# Patient Record
Sex: Female | Born: 1935
Health system: Southern US, Community
[De-identification: ages and names within clinical notes are randomized; demographics above are authoritative.]

## PROBLEM LIST (undated history)

## (undated) DIAGNOSIS — E785 Hyperlipidemia, unspecified: Secondary | ICD-10-CM

## (undated) DIAGNOSIS — F419 Anxiety disorder, unspecified: Secondary | ICD-10-CM

## (undated) DIAGNOSIS — K219 Gastro-esophageal reflux disease without esophagitis: Secondary | ICD-10-CM

## (undated) DIAGNOSIS — I38 Endocarditis, valve unspecified: Secondary | ICD-10-CM

## (undated) DIAGNOSIS — R0602 Shortness of breath: Secondary | ICD-10-CM

## (undated) DIAGNOSIS — J449 Chronic obstructive pulmonary disease, unspecified: Secondary | ICD-10-CM

## (undated) DIAGNOSIS — M1990 Unspecified osteoarthritis, unspecified site: Secondary | ICD-10-CM

## (undated) DIAGNOSIS — I499 Cardiac arrhythmia, unspecified: Secondary | ICD-10-CM

## (undated) DIAGNOSIS — S42492A Other displaced fracture of lower end of left humerus, initial encounter for closed fracture: Secondary | ICD-10-CM

## (undated) DIAGNOSIS — I10 Essential (primary) hypertension: Secondary | ICD-10-CM

## (undated) DIAGNOSIS — R011 Cardiac murmur, unspecified: Secondary | ICD-10-CM

## (undated) DIAGNOSIS — F39 Unspecified mood [affective] disorder: Secondary | ICD-10-CM

## (undated) DIAGNOSIS — C569 Malignant neoplasm of unspecified ovary: Secondary | ICD-10-CM

## (undated) HISTORY — DX: Essential (primary) hypertension: I10

## (undated) HISTORY — DX: Gastro-esophageal reflux disease without esophagitis: K21.9

## (undated) HISTORY — PX: CATARACT EXTRACTION, BILATERAL: SHX1313

## (undated) HISTORY — DX: Malignant neoplasm of unspecified ovary: C56.9

## (undated) HISTORY — DX: Hyperlipidemia, unspecified: E78.5

## (undated) HISTORY — PX: SKIN CANCER EXCISION: SHX779

## (undated) HISTORY — PX: BREAST BIOPSY: SHX20

## (undated) HISTORY — DX: Unspecified mood (affective) disorder: F39

## (undated) HISTORY — DX: Other displaced fracture of lower end of left humerus, initial encounter for closed fracture: S42.492A

## (undated) HISTORY — PX: TONSILLECTOMY: SUR1361

## (undated) HISTORY — PX: SHOULDER SURGERY: SHX246

---

## 2011-07-27 DIAGNOSIS — R5383 Other fatigue: Secondary | ICD-10-CM | POA: Diagnosis not present

## 2011-07-27 DIAGNOSIS — R5381 Other malaise: Secondary | ICD-10-CM | POA: Diagnosis not present

## 2011-07-27 DIAGNOSIS — R634 Abnormal weight loss: Secondary | ICD-10-CM | POA: Diagnosis not present

## 2011-12-20 DIAGNOSIS — R5381 Other malaise: Secondary | ICD-10-CM | POA: Diagnosis not present

## 2011-12-20 DIAGNOSIS — G4700 Insomnia, unspecified: Secondary | ICD-10-CM | POA: Diagnosis not present

## 2011-12-20 DIAGNOSIS — J449 Chronic obstructive pulmonary disease, unspecified: Secondary | ICD-10-CM | POA: Diagnosis not present

## 2011-12-20 DIAGNOSIS — R5383 Other fatigue: Secondary | ICD-10-CM | POA: Diagnosis not present

## 2011-12-20 DIAGNOSIS — R059 Cough, unspecified: Secondary | ICD-10-CM | POA: Diagnosis not present

## 2011-12-20 DIAGNOSIS — R05 Cough: Secondary | ICD-10-CM | POA: Diagnosis not present

## 2012-03-14 DIAGNOSIS — L738 Other specified follicular disorders: Secondary | ICD-10-CM | POA: Diagnosis not present

## 2012-03-14 DIAGNOSIS — Z23 Encounter for immunization: Secondary | ICD-10-CM | POA: Diagnosis not present

## 2012-03-14 DIAGNOSIS — L819 Disorder of pigmentation, unspecified: Secondary | ICD-10-CM | POA: Diagnosis not present

## 2012-03-14 DIAGNOSIS — L821 Other seborrheic keratosis: Secondary | ICD-10-CM | POA: Diagnosis not present

## 2012-03-14 DIAGNOSIS — L608 Other nail disorders: Secondary | ICD-10-CM | POA: Diagnosis not present

## 2012-07-11 DIAGNOSIS — R5383 Other fatigue: Secondary | ICD-10-CM | POA: Diagnosis not present

## 2012-07-11 DIAGNOSIS — E559 Vitamin D deficiency, unspecified: Secondary | ICD-10-CM | POA: Diagnosis not present

## 2012-07-11 DIAGNOSIS — H68009 Unspecified Eustachian salpingitis, unspecified ear: Secondary | ICD-10-CM | POA: Diagnosis not present

## 2012-07-11 DIAGNOSIS — R1012 Left upper quadrant pain: Secondary | ICD-10-CM | POA: Diagnosis not present

## 2012-07-11 DIAGNOSIS — R7309 Other abnormal glucose: Secondary | ICD-10-CM | POA: Diagnosis not present

## 2012-07-11 DIAGNOSIS — J440 Chronic obstructive pulmonary disease with acute lower respiratory infection: Secondary | ICD-10-CM | POA: Diagnosis not present

## 2012-07-11 DIAGNOSIS — J019 Acute sinusitis, unspecified: Secondary | ICD-10-CM | POA: Diagnosis not present

## 2012-07-11 DIAGNOSIS — R5381 Other malaise: Secondary | ICD-10-CM | POA: Diagnosis not present

## 2012-08-01 DIAGNOSIS — IMO0002 Reserved for concepts with insufficient information to code with codable children: Secondary | ICD-10-CM | POA: Diagnosis not present

## 2012-08-01 DIAGNOSIS — J309 Allergic rhinitis, unspecified: Secondary | ICD-10-CM | POA: Diagnosis not present

## 2012-08-01 DIAGNOSIS — J449 Chronic obstructive pulmonary disease, unspecified: Secondary | ICD-10-CM | POA: Diagnosis not present

## 2012-08-01 DIAGNOSIS — E559 Vitamin D deficiency, unspecified: Secondary | ICD-10-CM | POA: Diagnosis not present

## 2012-10-15 DIAGNOSIS — J449 Chronic obstructive pulmonary disease, unspecified: Secondary | ICD-10-CM | POA: Diagnosis not present

## 2012-11-06 DIAGNOSIS — E559 Vitamin D deficiency, unspecified: Secondary | ICD-10-CM | POA: Diagnosis not present

## 2012-11-06 DIAGNOSIS — J449 Chronic obstructive pulmonary disease, unspecified: Secondary | ICD-10-CM | POA: Diagnosis not present

## 2012-11-06 DIAGNOSIS — J309 Allergic rhinitis, unspecified: Secondary | ICD-10-CM | POA: Diagnosis not present

## 2012-11-07 DIAGNOSIS — J449 Chronic obstructive pulmonary disease, unspecified: Secondary | ICD-10-CM | POA: Diagnosis not present

## 2012-11-07 DIAGNOSIS — J309 Allergic rhinitis, unspecified: Secondary | ICD-10-CM | POA: Diagnosis not present

## 2012-11-25 DIAGNOSIS — Z1331 Encounter for screening for depression: Secondary | ICD-10-CM | POA: Diagnosis not present

## 2012-11-25 DIAGNOSIS — J309 Allergic rhinitis, unspecified: Secondary | ICD-10-CM | POA: Diagnosis not present

## 2012-11-25 DIAGNOSIS — E559 Vitamin D deficiency, unspecified: Secondary | ICD-10-CM | POA: Diagnosis not present

## 2012-11-25 DIAGNOSIS — J449 Chronic obstructive pulmonary disease, unspecified: Secondary | ICD-10-CM | POA: Diagnosis not present

## 2012-11-25 DIAGNOSIS — Z9181 History of falling: Secondary | ICD-10-CM | POA: Diagnosis not present

## 2012-11-25 DIAGNOSIS — R002 Palpitations: Secondary | ICD-10-CM | POA: Diagnosis not present

## 2012-11-25 DIAGNOSIS — Z13220 Encounter for screening for lipoid disorders: Secondary | ICD-10-CM | POA: Diagnosis not present

## 2012-11-28 DIAGNOSIS — IMO0002 Reserved for concepts with insufficient information to code with codable children: Secondary | ICD-10-CM | POA: Diagnosis not present

## 2012-11-28 DIAGNOSIS — F411 Generalized anxiety disorder: Secondary | ICD-10-CM | POA: Diagnosis not present

## 2012-11-28 DIAGNOSIS — J449 Chronic obstructive pulmonary disease, unspecified: Secondary | ICD-10-CM | POA: Diagnosis not present

## 2012-11-28 DIAGNOSIS — R110 Nausea: Secondary | ICD-10-CM | POA: Diagnosis not present

## 2012-12-05 DIAGNOSIS — IMO0002 Reserved for concepts with insufficient information to code with codable children: Secondary | ICD-10-CM | POA: Diagnosis not present

## 2012-12-05 DIAGNOSIS — Z0000 Encounter for general adult medical examination without abnormal findings: Secondary | ICD-10-CM | POA: Diagnosis not present

## 2012-12-05 DIAGNOSIS — I4891 Unspecified atrial fibrillation: Secondary | ICD-10-CM | POA: Diagnosis not present

## 2012-12-11 DIAGNOSIS — I10 Essential (primary) hypertension: Secondary | ICD-10-CM | POA: Diagnosis not present

## 2012-12-11 DIAGNOSIS — R079 Chest pain, unspecified: Secondary | ICD-10-CM | POA: Diagnosis not present

## 2012-12-11 DIAGNOSIS — J449 Chronic obstructive pulmonary disease, unspecified: Secondary | ICD-10-CM | POA: Diagnosis not present

## 2012-12-11 DIAGNOSIS — I4891 Unspecified atrial fibrillation: Secondary | ICD-10-CM | POA: Diagnosis not present

## 2012-12-18 DIAGNOSIS — N390 Urinary tract infection, site not specified: Secondary | ICD-10-CM | POA: Diagnosis not present

## 2012-12-18 DIAGNOSIS — I4891 Unspecified atrial fibrillation: Secondary | ICD-10-CM | POA: Diagnosis not present

## 2012-12-18 DIAGNOSIS — F39 Unspecified mood [affective] disorder: Secondary | ICD-10-CM | POA: Diagnosis not present

## 2012-12-23 DIAGNOSIS — M810 Age-related osteoporosis without current pathological fracture: Secondary | ICD-10-CM | POA: Diagnosis not present

## 2012-12-23 DIAGNOSIS — Z13820 Encounter for screening for osteoporosis: Secondary | ICD-10-CM | POA: Diagnosis not present

## 2012-12-23 DIAGNOSIS — Z1231 Encounter for screening mammogram for malignant neoplasm of breast: Secondary | ICD-10-CM | POA: Diagnosis not present

## 2013-01-01 DIAGNOSIS — R928 Other abnormal and inconclusive findings on diagnostic imaging of breast: Secondary | ICD-10-CM | POA: Diagnosis not present

## 2013-01-13 DIAGNOSIS — I4891 Unspecified atrial fibrillation: Secondary | ICD-10-CM | POA: Diagnosis not present

## 2013-01-13 DIAGNOSIS — R079 Chest pain, unspecified: Secondary | ICD-10-CM | POA: Diagnosis not present

## 2013-01-16 DIAGNOSIS — I10 Essential (primary) hypertension: Secondary | ICD-10-CM | POA: Diagnosis not present

## 2013-01-16 DIAGNOSIS — I4891 Unspecified atrial fibrillation: Secondary | ICD-10-CM | POA: Diagnosis not present

## 2013-01-20 DIAGNOSIS — I4891 Unspecified atrial fibrillation: Secondary | ICD-10-CM | POA: Diagnosis not present

## 2013-01-20 DIAGNOSIS — J309 Allergic rhinitis, unspecified: Secondary | ICD-10-CM | POA: Diagnosis not present

## 2013-01-20 DIAGNOSIS — E559 Vitamin D deficiency, unspecified: Secondary | ICD-10-CM | POA: Diagnosis not present

## 2013-01-20 DIAGNOSIS — J449 Chronic obstructive pulmonary disease, unspecified: Secondary | ICD-10-CM | POA: Diagnosis not present

## 2013-01-30 DIAGNOSIS — R079 Chest pain, unspecified: Secondary | ICD-10-CM | POA: Diagnosis not present

## 2013-02-05 ENCOUNTER — Encounter (HOSPITAL_COMMUNITY): Payer: Self-pay

## 2013-02-05 ENCOUNTER — Emergency Department (HOSPITAL_COMMUNITY): Payer: Medicare Other

## 2013-02-05 ENCOUNTER — Inpatient Hospital Stay (HOSPITAL_COMMUNITY)
Admission: EM | Admit: 2013-02-05 | Discharge: 2013-02-08 | DRG: 192 | Disposition: A | Discharge status: Home / Self Care | Payer: Medicare Other | Attending: Internal Medicine | Admitting: Internal Medicine

## 2013-02-05 DIAGNOSIS — I38 Endocarditis, valve unspecified: Secondary | ICD-10-CM | POA: Diagnosis present

## 2013-02-05 DIAGNOSIS — J479 Bronchiectasis, uncomplicated: Secondary | ICD-10-CM | POA: Diagnosis not present

## 2013-02-05 DIAGNOSIS — J449 Chronic obstructive pulmonary disease, unspecified: Secondary | ICD-10-CM | POA: Diagnosis present

## 2013-02-05 DIAGNOSIS — R0789 Other chest pain: Secondary | ICD-10-CM | POA: Diagnosis not present

## 2013-02-05 DIAGNOSIS — R0902 Hypoxemia: Secondary | ICD-10-CM

## 2013-02-05 DIAGNOSIS — R0602 Shortness of breath: Secondary | ICD-10-CM | POA: Diagnosis not present

## 2013-02-05 DIAGNOSIS — R079 Chest pain, unspecified: Secondary | ICD-10-CM

## 2013-02-05 DIAGNOSIS — Z79899 Other long term (current) drug therapy: Secondary | ICD-10-CM

## 2013-02-05 DIAGNOSIS — R05 Cough: Secondary | ICD-10-CM | POA: Diagnosis not present

## 2013-02-05 DIAGNOSIS — Z87891 Personal history of nicotine dependence: Secondary | ICD-10-CM | POA: Diagnosis not present

## 2013-02-05 DIAGNOSIS — Z66 Do not resuscitate: Secondary | ICD-10-CM | POA: Diagnosis not present

## 2013-02-05 DIAGNOSIS — I4891 Unspecified atrial fibrillation: Secondary | ICD-10-CM | POA: Diagnosis not present

## 2013-02-05 DIAGNOSIS — J441 Chronic obstructive pulmonary disease with (acute) exacerbation: Secondary | ICD-10-CM | POA: Diagnosis not present

## 2013-02-05 DIAGNOSIS — I5189 Other ill-defined heart diseases: Secondary | ICD-10-CM | POA: Diagnosis present

## 2013-02-05 DIAGNOSIS — R059 Cough, unspecified: Secondary | ICD-10-CM | POA: Diagnosis not present

## 2013-02-05 HISTORY — DX: Chest pain, unspecified: R07.9

## 2013-02-05 HISTORY — DX: Anxiety disorder, unspecified: F41.9

## 2013-02-05 HISTORY — DX: Shortness of breath: R06.02

## 2013-02-05 HISTORY — DX: Hypoxemia: R09.02

## 2013-02-05 HISTORY — DX: Endocarditis, valve unspecified: I38

## 2013-02-05 HISTORY — DX: Unspecified osteoarthritis, unspecified site: M19.90

## 2013-02-05 HISTORY — DX: Cardiac arrhythmia, unspecified: I49.9

## 2013-02-05 HISTORY — DX: Cardiac murmur, unspecified: R01.1

## 2013-02-05 HISTORY — DX: Chronic obstructive pulmonary disease, unspecified: J44.9

## 2013-02-05 LAB — CBC
HCT: 44.1 % (ref 36.0–46.0)
MCH: 33.3 pg (ref 26.0–34.0)
MCHC: 35.1 g/dL (ref 30.0–36.0)
MCV: 94.8 fL (ref 78.0–100.0)
Platelets: 238 10*3/uL (ref 150–400)
RDW: 14.6 % (ref 11.5–15.5)

## 2013-02-05 LAB — TROPONIN I: Troponin I: <0.3 ng/mL

## 2013-02-05 LAB — POCT I-STAT, CHEM 8
BUN: 20 mg/dL (ref 6–23)
Calcium, Ion: 1.17 mmol/L (ref 1.13–1.30)
Chloride: 105 mEq/L (ref 96–112)
Creatinine, Ser: 1 mg/dL (ref 0.50–1.10)
TCO2: 23 mmol/L (ref 0–100)

## 2013-02-05 LAB — URINALYSIS, ROUTINE W REFLEX MICROSCOPIC
Hgb urine dipstick: NEGATIVE
Ketones, ur: 15 mg/dL — AB
Nitrite: NEGATIVE
Urobilinogen, UA: 0.2 mg/dL (ref 0.0–1.0)

## 2013-02-05 LAB — POCT I-STAT TROPONIN I: Troponin i, poc: 0.03 ng/mL (ref 0.00–0.08)

## 2013-02-05 LAB — D-DIMER, QUANTITATIVE: D-Dimer, Quant: 0.64 ug/mL-FEU — ABNORMAL HIGH (ref 0.00–0.48)

## 2013-02-05 LAB — URINE MICROSCOPIC-ADD ON

## 2013-02-05 MED ORDER — SODIUM CHLORIDE 0.9 % IJ SOLN: 3.0000 mL | Freq: Two times a day (BID) | INTRAMUSCULAR | Status: DC

## 2013-02-05 MED ORDER — LEVALBUTEROL HCL 1.25 MG/0.5ML IN NEBU
1.2500 mg | INHALATION_SOLUTION | Freq: Once | RESPIRATORY_TRACT | Status: AC
  Administered 2013-02-05: 1.25 mg via RESPIRATORY_TRACT
  Filled 2013-02-05: qty 0.5

## 2013-02-05 MED ORDER — HYDROCODONE-ACETAMINOPHEN 5-325 MG PO TABS: 1.0000 | ORAL_TABLET | ORAL | Status: DC | PRN

## 2013-02-05 MED ORDER — LEVALBUTEROL HCL 1.25 MG/3ML IN NEBU: 1.2500 mg | INHALATION_SOLUTION | Freq: Once | RESPIRATORY_TRACT | Status: DC

## 2013-02-05 MED ORDER — DILTIAZEM HCL 60 MG PO TABS
120.0000 mg | ORAL_TABLET | Freq: Every day | ORAL | Status: DC
  Administered 2013-02-05 – 2013-02-08 (×4): 120 mg via ORAL
  Filled 2013-02-05 (×4): qty 2

## 2013-02-05 MED ORDER — ONDANSETRON HCL 4 MG/2ML IJ SOLN
4.0000 mg | Freq: Four times a day (QID) | INTRAMUSCULAR | Status: DC | PRN
  Filled 2013-02-05: qty 2

## 2013-02-05 MED ORDER — LEVALBUTEROL HCL 0.63 MG/3ML IN NEBU
0.6300 mg | INHALATION_SOLUTION | Freq: Once | RESPIRATORY_TRACT | Status: AC
  Administered 2013-02-05: 0.63 mg via RESPIRATORY_TRACT
  Filled 2013-02-05: qty 3

## 2013-02-05 MED ORDER — RIVAROXABAN 20 MG PO TABS
20.0000 mg | ORAL_TABLET | Freq: Every day | ORAL | Status: DC
  Administered 2013-02-05 – 2013-02-08 (×4): 20 mg via ORAL
  Filled 2013-02-05 (×4): qty 1

## 2013-02-05 MED ORDER — IOHEXOL 350 MG/ML SOLN
100.0000 mL | Freq: Once | INTRAVENOUS | Status: AC | PRN
  Administered 2013-02-05: 80 mL via INTRAVENOUS

## 2013-02-05 MED ORDER — IPRATROPIUM BROMIDE 0.02 % IN SOLN
0.5000 mg | Freq: Once | RESPIRATORY_TRACT | Status: AC
  Administered 2013-02-05: 0.5 mg via RESPIRATORY_TRACT
  Filled 2013-02-05: qty 2.5

## 2013-02-05 MED ORDER — ONDANSETRON HCL 4 MG PO TABS
4.0000 mg | ORAL_TABLET | Freq: Four times a day (QID) | ORAL | Status: DC | PRN
  Filled 2013-02-05: qty 1

## 2013-02-05 MED ORDER — IPRATROPIUM BROMIDE 0.02 % IN SOLN
500.0000 ug | Freq: Every day | RESPIRATORY_TRACT | Status: DC | PRN
  Filled 2013-02-05 (×3): qty 2.5

## 2013-02-05 MED ORDER — LEVALBUTEROL HCL 0.63 MG/3ML IN NEBU
0.6300 mg | INHALATION_SOLUTION | Freq: Four times a day (QID) | RESPIRATORY_TRACT | Status: DC
  Administered 2013-02-05 – 2013-02-06 (×2): 0.63 mg via RESPIRATORY_TRACT
  Filled 2013-02-05 (×6): qty 3

## 2013-02-05 MED ORDER — LEVOFLOXACIN IN D5W 750 MG/150ML IV SOLN
750.0000 mg | INTRAVENOUS | Status: DC
  Administered 2013-02-05 – 2013-02-06 (×2): 750 mg via INTRAVENOUS
  Filled 2013-02-05 (×3): qty 150

## 2013-02-05 MED ORDER — GUAIFENESIN ER 600 MG PO TB12
1200.0000 mg | ORAL_TABLET | Freq: Two times a day (BID) | ORAL | Status: DC
  Administered 2013-02-05 – 2013-02-08 (×6): 1200 mg via ORAL
  Filled 2013-02-05 (×7): qty 2

## 2013-02-05 MED ORDER — ACETAMINOPHEN 325 MG PO TABS
650.0000 mg | ORAL_TABLET | Freq: Four times a day (QID) | ORAL | Status: DC | PRN
  Filled 2013-02-05: qty 2

## 2013-02-05 MED ORDER — METHYLPREDNISOLONE SODIUM SUCC 125 MG IJ SOLR
125.0000 mg | Freq: Once | INTRAMUSCULAR | Status: DC
  Filled 2013-02-05: qty 2

## 2013-02-05 MED ORDER — METHYLPREDNISOLONE SODIUM SUCC 125 MG IJ SOLR
80.0000 mg | Freq: Three times a day (TID) | INTRAMUSCULAR | Status: DC
  Administered 2013-02-05 – 2013-02-06 (×3): 80 mg via INTRAVENOUS
  Filled 2013-02-05 (×5): qty 1.28

## 2013-02-05 MED ORDER — MORPHINE SULFATE 2 MG/ML IJ SOLN: 1.0000 mg | INTRAMUSCULAR | Status: DC | PRN

## 2013-02-05 MED ORDER — IPRATROPIUM BROMIDE 0.02 % IN SOLN
0.5000 mg | Freq: Four times a day (QID) | RESPIRATORY_TRACT | Status: DC
  Administered 2013-02-05 – 2013-02-06 (×2): 0.5 mg via RESPIRATORY_TRACT

## 2013-02-05 MED ORDER — ACETAMINOPHEN 650 MG RE SUPP: 650.0000 mg | Freq: Four times a day (QID) | RECTAL | Status: DC | PRN

## 2013-02-05 MED ORDER — SODIUM CHLORIDE 0.9 % IV SOLN
INTRAVENOUS | Status: DC
  Administered 2013-02-05 – 2013-02-06 (×2): via INTRAVENOUS

## 2013-02-05 MED ORDER — GUAIFENESIN-DM 100-10 MG/5ML PO SYRP: 5.0000 mL | ORAL_SOLUTION | ORAL | Status: DC | PRN

## 2013-02-05 NOTE — ED Provider Notes (Signed)
CSN: [id number]     Arrival date & time 02/05/13  1038 History   First MD Initiated Contact with Patient 02/05/13 1139     Chief Complaint  Patient presents with  . Chest Pain  . Shortness of Breath   (Consider location/radiation/quality/duration/timing/severity/associated sxs/prior Treatment) HPI Pt presents with c/o chest pain and shortness of breath.  Pt states that last night around 1am she had left sided chest pain with radiation into her back.  She states the pain lasted approx 1 hour and resolved on its own.  She also c/o shortness of breath and tightness in chest.  She states she has had issues with wheezing in the past- but denies hx of asthma/copd.  Stopped smoking in February 2014.  No fever/chills.  Denies leg swelling.  States her chest has felt congested over the past several weeks.  She also states that approx 2 months ago she was diagnosed with atrial fibrillation and started on xarelto.  She sees PMD and cardiologist in [place].  There are no other associated systemic symptoms, there are no other alleviating or modifying factors.   Past Medical History  Diagnosis Date  . Irregular heart beat   . Leaky heart valve    Past Surgical History  Procedure Laterality Date  . Tonsillectomy     Family History  Problem Relation Age of Onset  . Diabetes Mother   . Asthma Father    History  Substance Use Topics  . Smoking status: Former Smoker    Quit date: 06/07/2012  . Smokeless tobacco: Not on file  . Alcohol Use: No   OB History   Grav Para Term Preterm Abortions TAB SAB Ect Mult Living                 Review of Systems ROS reviewed and all otherwise negative except for mentioned in HPI  Allergies  Albuterol  Home Medications   No current outpatient prescriptions on file. BP 150/74  Pulse 80  Temp(Src) 97.8 F (36.6 C) (Oral)  Resp 24  SpO2 90% Vitals reviewed Physical Exam Physical Examination: General appearance - alert, well appearing, and in no  distress Mental status - alert, oriented to person, place, and time Eyes - no conjunctival injection, no scleral icterus Mouth - mucous membranes moist, pharynx normal without lesions Chest - increased respiratory effort, bilateral expiratory wheezing, no rhonchi, symmetric air entry Heart - normal rate, regular rhythm, normal S1, S2, no murmurs, rubs, clicks or gallops Abdomen - soft, nontender, nondistended, no masses or organomegaly Extremities - peripheral pulses normal, no pedal edema, no clubbing or cyanosis Skin - normal coloration and turgor, no rashes  ED Course  Procedures (including critical care time)  12:05 PM have gone to see patient twice, she is in xray, not in room Labs Review   Date: 02/05/2013  Rate: 85  Rhythm: normal sinus rhythm  QRS Axis: normal  Intervals: normal  ST/T Wave abnormalities: nonspecific ST/T changes  Conduction Disutrbances:none  Narrative Interpretation: poor r wave progression  Old EKG Reviewed: none available   4:35 PM d/w [facility] for admission- requested team 10 telemetry bed, holding orders written Labs Reviewed  CBC - Abnormal; Notable for the following:    Hemoglobin 15.5 (*)    All other components within normal limits  URINALYSIS, ROUTINE W REFLEX MICROSCOPIC - Abnormal; Notable for the following:    Bilirubin Urine SMALL (*)    Ketones, ur 15 (*)    Leukocytes, UA TRACE (*)  All other components within normal limits  D-DIMER, QUANTITATIVE - Abnormal; Notable for the following:    D-Dimer, Quant 0.64 (*)    All other components within normal limits  POCT I-STAT, CHEM 8 - Abnormal; Notable for the following:    Hemoglobin 15.6 (*)    All other components within normal limits  PRO B NATRIURETIC PEPTIDE  URINE MICROSCOPIC-ADD ON  TSH  BASIC METABOLIC PANEL  CBC  TROPONIN I  TROPONIN I  TROPONIN I  POCT I-STAT TROPONIN I   Imaging Review Dg Chest 2 View  02/05/2013   CLINICAL DATA:  79 year old female with chest pain  shortness of breath cough and congestion.  EXAM: CHEST  2 VIEW  COMPARISON:  None.  FINDINGS: Lung volumes at the upper limits of normal. Normal cardiac size and mediastinal contours. Visualized tracheal air column is within normal limits. No pneumothorax, pulmonary edema, pleural effusion or confluent pulmonary opacity. Calcified atherosclerosis of the aorta. Osteopenia and mild scoliosis. Postoperative changes to the proximal right humerus.  IMPRESSION: No active cardiopulmonary disease.   Electronically Signed   By: Augusto Gamble   On: 02/05/2013 12:09   Ct Angio Chest Pe W/cm &/or Wo Cm  02/05/2013   CLINICAL DATA:  79 year old female chest pain and shortness of Breath.  EXAM: CT ANGIOGRAPHY CHEST WITH CONTRAST  TECHNIQUE: Multidetector CT imaging of the chest was performed using the standard protocol during bolus administration of intravenous contrast. Multiplanar CT image reconstructions including MIPs were obtained to evaluate the vascular anatomy.  CONTRAST:  80mL OMNIPAQUE IOHEXOL 350 MG/ML SOLN  COMPARISON:  Chest radiographs 02/05/2013.  FINDINGS: Good contrast bolus timing in the pulmonary arterial tree. Mild respiratory motion artifact. No focal filling defect identified in the pulmonary arterial tree to suggest the presence of acute pulmonary embolism.  Major airways are patent. Mild bilateral central peribronchial thickening. Mild right upper lobe bronchiectasis. No consolidation. No pleural effusion.  No pericardial effusion. Mild to moderate hiatal hernia. Diffuse atherosclerosis of the visualized aorta. Great vessel origins are patent. Coronary atherosclerosis. Abundant soft plaque or mural thrombus in the proximal descending thoracic aorta. Celiac origin is patent. No mediastinal lymphadenopathy.  Negative visualized upper abdominal viscera.  Mild scoliosis. Mild for age degenerative changes in the spine. No acute osseous abnormality identified.  Review of the MIP images confirms the above findings.   IMPRESSION: 1. No evidence of acute pulmonary embolus.  2. No acute findings identified. Mild peribronchial thickening. Occasional bronchiectasis. Extensive aortic atherosclerosis. Small hiatal hernia.   Electronically Signed   By: Augusto Gamble   On: 02/05/2013 14:48    MDM   1. COPD with acute exacerbation   2. Atrial fibrillation   3. Chest pain   4. Hypoxia    Pt presenting with c/o shortness of breath and chest pain.  Chest pain has resolved, troponin is negative, EKG without any acute changes- however given age must consider ACS.  She is currently taking xarelto for afib- relatively newly diagnosed, EKG shows sinus rhythm today.  Pt has significant wheezing on exam, some improvement after xopenex and atrovent (pt states she cannot tolerate albuterol).  She was also started on levaquin and solumedrol.  Pt admitted to [facility] for further management.      Ethelda Chick, MD 02/05/13 [phone]

## 2013-02-05 NOTE — ED Notes (Signed)
Patient transported to X-ray 

## 2013-02-05 NOTE — ED Notes (Signed)
MD at bedside. 

## 2013-02-05 NOTE — H&P (Signed)
[facility] History and Physical  Adriana Castro WUX:[id number] DOB: 1929/04/17 DOA: 02/05/2013  Referring physician: Ethelda Chick, MD PCP: Lucianne Lei, MD  Specialists:   Chief Complaint: Shortness of breath and chest pain  HPI: Adriana Castro is a 79 y.o. female was past medical history of atrial fibrillation, COPD and leaky heart filed. Patient came in to the hospital because of shortness of breath and chest pain. Patient lives [place], [place], she stated she was in her usual state of health till yesterday when she started to have some shortness of breath, wheezing, cough with minimal sputum production. Last night she developed stabbing chest pain that goes from her epigastric/substernal area to the back. The pain was 6/10 in intensity, no radiation lasted for about an hour. Patient came into the ED for further evaluation. In the ED initial evaluation showed positive d-dimer is, CT angio was done and showed no evidence of PE or dissection. She has negative cardiac enzymes, negative EKG. She was hypoxic on room air, patient admitted to the hospital for COPD exacerbation.  Review of Systems: Constitutional: negative for anorexia, fevers and sweats Eyes: negative for irritation, redness and visual disturbance Ears, nose, mouth, throat, and face: negative for earaches, epistaxis, nasal congestion and sore throat Respiratory: Has cough, dyspnea on exertion, sputum and wheezing Cardiovascular: Has chest pain and dyspnea, but denies lower extremity edema, orthopnea, palpitations and syncope Gastrointestinal: negative for abdominal pain, constipation, diarrhea, melena, nausea and vomiting Genitourinary:negative for dysuria, frequency and hematuria Hematologic/lymphatic: negative for bleeding, easy bruising and lymphadenopathy Musculoskeletal:negative for arthralgias, muscle weakness and stiff joints Neurological: negative for coordination problems, gait problems, headaches and weakness Endocrine:  negative for diabetic symptoms including polydipsia, polyuria and weight loss Allergic/Immunologic: negative for anaphylaxis, hay fever and urticaria  Past Medical History  Diagnosis Date  . Irregular heart beat   . Leaky heart valve    Past Surgical History  Procedure Laterality Date  . Tonsillectomy     Social History:  reports that she quit smoking about 7 months ago. She does not have any smokeless tobacco history on file. She reports that she does not drink alcohol or use illicit drugs.  Allergies  Allergen Reactions  . Albuterol Nausea And Vomiting    Family History  Problem Relation Age of Onset  . Diabetes Mother   . Asthma Father    Prior to Admission medications   Medication Sig Start Date End Date Taking? Authorizing Provider  diltiazem (CARDIZEM) 120 MG tablet Take 120 mg by mouth daily.   Yes Historical Provider, MD  ipratropium (ATROVENT) 0.02 % nebulizer solution Take 500 mcg by nebulization daily as needed for wheezing.   Yes Historical Provider, MD  levalbuterol [facility] HFA) 45 MCG/ACT inhaler Inhale 1-2 puffs into the lungs every 4 (four) hours as needed for wheezing or shortness of breath.   Yes Historical Provider, MD  Rivaroxaban (XARELTO) 20 MG TABS tablet Take 20 mg by mouth daily.   Yes Historical Provider, MD  tiotropium (SPIRIVA) 18 MCG inhalation capsule Place 18 mcg into inhaler and inhale daily.   Yes Historical Provider, MD   Physical Exam: Filed Vitals:   02/05/13 1700  BP: 150/74  Pulse: 80  Temp:   Resp:    General appearance: alert, cooperative and no distress, oxygen per nasal cannula Head: Normocephalic, without obvious abnormality, atraumatic  Eyes: conjunctivae/corneas clear. PERRL, EOM's intact. Fundi benign.  Nose: Nares normal. Septum midline. Mucosa normal. No drainage or sinus tenderness.  Throat: lips, mucosa, and tongue  normal; teeth and gums normal  Neck: Supple, no masses, no cervical lymphadenopathy, no JVD appreciated, no  meningeal signs Resp: Bilateral expiratory wheezes Chest wall: no tenderness  Cardio: regular rate and rhythm, S1, S2 normal, no murmur, click, rub or gallop  GI: soft, non-tender; bowel sounds normal; no masses, no organomegaly  Extremities: extremities normal, atraumatic, no cyanosis or edema  Skin: Skin color, texture, turgor normal. No rashes or lesions  Neurologic: Alert and oriented X 3, normal strength and tone. Normal symmetric reflexes. Normal coordination and gait   Labs on Admission:  Basic Metabolic Panel:  Recent Labs Lab 02/05/13 1322  NA 139  K 4.5  CL 105  GLUCOSE 82  BUN 20  CREATININE 1.00   Liver Function Tests: No results found for this basename: AST, ALT, ALKPHOS, BILITOT, PROT, ALBUMIN,  in the last 168 hours No results found for this basename: LIPASE, AMYLASE,  in the last 168 hours No results found for this basename: AMMONIA,  in the last 168 hours CBC:  Recent Labs Lab 02/05/13 1130 02/05/13 1322  WBC 7.9  --   HGB 15.5* 15.6*  HCT 44.1 46.0  MCV 94.8  --   PLT 238  --    Cardiac Enzymes: No results found for this basename: CKTOTAL, CKMB, CKMBINDEX, TROPONINI,  in the last 168 hours  BNP (last 3 results)  Recent Labs  02/05/13 1130  PROBNP 355.2   CBG: No results found for this basename: GLUCAP,  in the last 168 hours  Radiological Exams on Admission: Dg Chest 2 View  02/05/2013   CLINICAL DATA:  79 year old female with chest pain shortness of breath cough and congestion.  EXAM: CHEST  2 VIEW  COMPARISON:  None.  FINDINGS: Lung volumes at the upper limits of normal. Normal cardiac size and mediastinal contours. Visualized tracheal air column is within normal limits. No pneumothorax, pulmonary edema, pleural effusion or confluent pulmonary opacity. Calcified atherosclerosis of the aorta. Osteopenia and mild scoliosis. Postoperative changes to the proximal right humerus.  IMPRESSION: No active cardiopulmonary disease.   Electronically Signed    By: Augusto Gamble   On: 02/05/2013 12:09   Ct Angio Chest Pe W/cm &/or Wo Cm  02/05/2013   CLINICAL DATA:  79 year old female chest pain and shortness of Breath.  EXAM: CT ANGIOGRAPHY CHEST WITH CONTRAST  TECHNIQUE: Multidetector CT imaging of the chest was performed using the standard protocol during bolus administration of intravenous contrast. Multiplanar CT image reconstructions including MIPs were obtained to evaluate the vascular anatomy.  CONTRAST:  80mL OMNIPAQUE IOHEXOL 350 MG/ML SOLN  COMPARISON:  Chest radiographs 02/05/2013.  FINDINGS: Good contrast bolus timing in the pulmonary arterial tree. Mild respiratory motion artifact. No focal filling defect identified in the pulmonary arterial tree to suggest the presence of acute pulmonary embolism.  Major airways are patent. Mild bilateral central peribronchial thickening. Mild right upper lobe bronchiectasis. No consolidation. No pleural effusion.  No pericardial effusion. Mild to moderate hiatal hernia. Diffuse atherosclerosis of the visualized aorta. Great vessel origins are patent. Coronary atherosclerosis. Abundant soft plaque or mural thrombus in the proximal descending thoracic aorta. Celiac origin is patent. No mediastinal lymphadenopathy.  Negative visualized upper abdominal viscera.  Mild scoliosis. Mild for age degenerative changes in the spine. No acute osseous abnormality identified.  Review of the MIP images confirms the above findings.  IMPRESSION: 1. No evidence of acute pulmonary embolus.  2. No acute findings identified. Mild peribronchial thickening. Occasional bronchiectasis. Extensive aortic atherosclerosis. Small hiatal  hernia.   Electronically Signed   By: Augusto Gamble   On: 02/05/2013 14:48    EKG: Independently reviewed.   Assessment/Plan Principal Problem:   COPD with acute exacerbation Active Problems:   Hypoxia   Leaky heart valve   Chest pain   Atrial fibrillation   COPD with acute exacerbation -Patient will be  admitted to the hospital for further evaluation. -Started on IV Levaquin and IV steroids. -Supportive management with bronchodilators, mucolytics and stenosis. -CT scan showed no acute abnormalities.  Hypoxia -Secondary to COPD, patient is on 2 L of oxygen.  Atrial fibrillation -Rate is controlled with Cardizem -Patient is anticoagulated with Xarelto.  Chest pain -Atypical chest pain, substernal but radiates to the back, and resolve on its own. -As mentioned above CT angio of the chest was negative for acute abnormalities. -First set of cardiac enzymes were negative, cycle 3 sets of cardiac enzymes, repeat EKG in a.m.  Code Status: Full code Family Communication: Plan discussed with the patient with the presence of her sister and brother-in-law at bedside. Disposition Plan: Inpatient, telemetry, anticipate length of stay to be greater than 2 midnights  Time spent: 70 minutes  [facility] Pager [phone]  If 7PM-7AM, please contact night-coverage [email or web address] Password [facility] 02/05/2013, 5:45 PM

## 2013-02-06 ENCOUNTER — Encounter (HOSPITAL_COMMUNITY): Payer: Self-pay | Admitting: General Practice

## 2013-02-06 DIAGNOSIS — R079 Chest pain, unspecified: Secondary | ICD-10-CM | POA: Diagnosis not present

## 2013-02-06 DIAGNOSIS — J441 Chronic obstructive pulmonary disease with (acute) exacerbation: Secondary | ICD-10-CM | POA: Diagnosis not present

## 2013-02-06 DIAGNOSIS — R0902 Hypoxemia: Secondary | ICD-10-CM | POA: Diagnosis not present

## 2013-02-06 DIAGNOSIS — I4891 Unspecified atrial fibrillation: Secondary | ICD-10-CM | POA: Diagnosis not present

## 2013-02-06 LAB — TSH: TSH: 1.012 u[IU]/mL (ref 0.350–4.500)

## 2013-02-06 LAB — BASIC METABOLIC PANEL
Calcium: 9.5 mg/dL (ref 8.4–10.5)
GFR calc Af Amer: 89 mL/min — ABNORMAL LOW (ref >90)
GFR calc non Af Amer: 76 mL/min — ABNORMAL LOW (ref >90)
Potassium: 4.3 mEq/L (ref 3.5–5.1)
Sodium: 137 mEq/L (ref 135–145)

## 2013-02-06 LAB — CBC
Hemoglobin: 13.4 g/dL (ref 12.0–15.0)
MCH: 32.1 pg (ref 26.0–34.0)
Platelets: 237 10*3/uL (ref 150–400)
RBC: 4.18 MIL/uL (ref 3.87–5.11)
WBC: 7.4 10*3/uL (ref 4.0–10.5)

## 2013-02-06 LAB — HEMOGLOBIN A1C
Hgb A1c MFr Bld: 5.4 % (ref <5.7)
Mean Plasma Glucose: 108 mg/dL (ref <117)

## 2013-02-06 LAB — TROPONIN I: Troponin I: <0.3 ng/mL (ref <0.30)

## 2013-02-06 MED ORDER — PREDNISONE 50 MG PO TABS
60.0000 mg | ORAL_TABLET | Freq: Every day | ORAL | Status: DC
  Administered 2013-02-07 – 2013-02-08 (×2): 60 mg via ORAL
  Filled 2013-02-06 (×4): qty 1

## 2013-02-06 MED ORDER — IPRATROPIUM BROMIDE 0.02 % IN SOLN
0.5000 mg | Freq: Two times a day (BID) | RESPIRATORY_TRACT | Status: DC
  Administered 2013-02-06 – 2013-02-08 (×4): 0.5 mg via RESPIRATORY_TRACT
  Filled 2013-02-06 (×3): qty 2.5

## 2013-02-06 MED ORDER — LEVALBUTEROL HCL 0.63 MG/3ML IN NEBU
0.6300 mg | INHALATION_SOLUTION | Freq: Two times a day (BID) | RESPIRATORY_TRACT | Status: DC
  Administered 2013-02-06 – 2013-02-08 (×4): 0.63 mg via RESPIRATORY_TRACT
  Filled 2013-02-06 (×8): qty 3

## 2013-02-06 NOTE — Progress Notes (Signed)
Discussed with the patient in the presence of her 2 sisters. Patient wants to be DO NOT RESUSCITATE/ DO NOT INTUBATE.  Clint Lipps Pager: [phone] 02/06/2013, 4:14 PM

## 2013-02-06 NOTE — Progress Notes (Signed)
[facility] clear mild diminished this morning.  No resp distress noted.  Pt states her breathing feels back to normal.  Pt denies SOB and denies DOE.  Changed xopenex/atrovent nebs to bid-pt states this is her home reg.

## 2013-02-06 NOTE — Progress Notes (Signed)
Utilization review completed.  

## 2013-02-06 NOTE — Evaluation (Signed)
Physical Therapy Evaluation Patient Details Name: Adriana Castro MRN: [id number] DOB: 10/25/1928 Today's Date: 02/06/2013 Time: 4098-1191 PT Time Calculation (min): 26 min  PT Assessment / Plan / Recommendation History of Present Illness  Adriana Castro is a 79 y.o. female was past medical history of atrial fibrillation, COPD and leaky heart filed. Patient came in to the hospital because of shortness of breath and chest pain. Patient lives [place], [place], she stated she was in her usual state of health till yesterday when she started to have some shortness of breath, wheezing, cough with minimal sputum production. Last night she developed stabbing chest pain that goes from her epigastric/substernal area to the back. The pain was 6/10 in intensity, no radiation lasted for about an hour. Patient came into the ED for further evaluation.  Clinical Impression  Patient at baseline for mobility at this time. Patient ambulated without assistive device.  Patient did demonstrate decreased activity tolerance with O2 saturation dropping to 86 on rm air with increased ambulation. Educated patient on breathing techniques and energy conservation. Patient has supportive family nearby. Feel patient is safe for dc home without PT follow up. Patient agrees, PT will sign off.    PT Assessment  Patent does not need any further PT services    Follow Up Recommendations  No PT follow up          Equipment Recommendations  None recommended by PT          Precautions / Restrictions     Pertinent Vitals/Pain No pain, Spo@ on2 liters 99%, on rm air 97%, ambulation on rm air dropped to 86%      Mobility  Bed Mobility Bed Mobility: Supine to Sit;Sitting - Scoot to Edge of Bed Supine to Sit: 6: Modified independent (Device/Increase time) Sitting - Scoot to Edge of Bed: 6: Modified independent (Device/Increase time) Details for Bed Mobility Assistance: increased time to perform Transfers Transfers: Sit to  Stand;Stand to Sit Sit to Stand: 6: Modified independent (Device/Increase time);Without upper extremity assist;From bed;From chair/3-in-1;From toilet Stand to Sit: 6: Modified independent (Device/Increase time);To bed;To chair/3-in-1;To toilet Details for Transfer Assistance: No physical assist needed Ambulation/Gait Ambulation/Gait Assistance: 7: Independent Ambulation Distance (Feet): 410 Feet Assistive device: None Ambulation/Gait Assistance Details: steady gait Gait Pattern: Trunk flexed;Narrow base of support Gait velocity: decreased modestly General Gait Details: overall steady with ambulation, some generalized fatigue with increased activity     Acute Rehab PT Goals Patient Stated Goal: to go home today PT Goal Formulation: With patient Time For Goal Achievement: 02/20/13 Potential to Achieve Goals: Good  Visit Information  Last PT Received On: 02/06/13 Assistance Needed: +1 History of Present Illness: Adriana Castro is a 79 y.o. female was past medical history of atrial fibrillation, COPD and leaky heart filed. Patient came in to the hospital because of shortness of breath and chest pain. Patient lives [place], [place], she stated she was in her usual state of health till yesterday when she started to have some shortness of breath, wheezing, cough with minimal sputum production. Last night she developed stabbing chest pain that goes from her epigastric/substernal area to the back. The pain was 6/10 in intensity, no radiation lasted for about an hour. Patient came into the ED for further evaluation.       Prior Functioning  Home Living Family/patient expects to be discharged to:: Private residence Living Arrangements: Alone Available Help at Discharge: Family Type of Home: House Home Access: Stairs to enter [employer] of Steps: 1 Entrance Stairs-Rails:  Right;Left Home Layout: One level Home Equipment: Cane - single point Prior Function Level of Independence:  Independent Communication Communication: No difficulties Dominant Hand: Right    Cognition  Cognition Arousal/Alertness: Awake/alert Behavior During Therapy: WFL for tasks assessed/performed Overall Cognitive Status: Within Functional Limits for tasks assessed    Extremity/Trunk Assessment Upper Extremity Assessment Upper Extremity Assessment: Overall WFL for tasks assessed Lower Extremity Assessment Lower Extremity Assessment: Generalized weakness   Balance Balance Balance Assessed: Yes High Level Balance High Level Balance Activites: Side stepping;Backward walking;Direction changes;Turns;Head turns High Level Balance Comments: no difficulties  End of Session PT - End of Session Equipment Utilized During Treatment: Gait belt Activity Tolerance: Patient limited by fatigue Patient left: in chair;with call bell/phone within reach;with family/visitor present Nurse Communication: Mobility status (O2 desaturation on rm air during ambulation 86%)  GP     Fabio Asa 02/06/2013, 11:35 AM Charlotte Crumb, PT DPT  [phone]

## 2013-02-06 NOTE — Progress Notes (Signed)
[facility] PROGRESS NOTE  Adriana Castro ZOX:[id number] DOB: 25-Mar-1929 DOA: 02/05/2013 PCP: Lucianne Lei, MD  HPI/Subjective: Feels much better, denies any significant shortness of breath, denies any chest pain.  Assessment/Plan: Principal Problem:   COPD with acute exacerbation Active Problems:   Hypoxia   Leaky heart valve   Chest pain   Atrial fibrillation   COPD with acute exacerbation  -Patient will be admitted to the hospital for further evaluation.  -Started on IV Levaquin and IV steroids.  -Supportive management with bronchodilators, mucolytics and stenosis.  -CT scan showed no acute abnormalities.  -Switch Solu-Medrol to oral prednisone today as wheezing is improving.  Hypoxia  -Secondary to COPD, patient is on 2 L of oxygen.   Atrial fibrillation  -Rate is controlled with Cardizem  -Patient is anticoagulated with Xarelto.   Chest pain  -Atypical chest pain, likely secondary to shortness of breath/COPD exacerbation. -As mentioned above CT angio of the chest was negative for acute abnormalities.  -Chest a result, has -3 sets of cardiac enzymes, EKG showed no evidence of ischemia.  Code Status: Full code Family Communication: Plan discussed with the patient. Disposition Plan: Remains inpatient   Consultants:  None  Procedures:  None  Antibiotics:  Rocephin and azithromycin   Objective: Filed Vitals:   02/06/13 0445  BP: 127/74  Pulse: 76  Temp: 97.8 F (36.6 C)  Resp: 18    Intake/Output Summary (Last 24 hours) at 02/06/13 1211 Last data filed at 02/06/13 0800  Gross per 24 hour  Intake 1461.25 ml  Output      0 ml  Net 1461.25 ml   There were no vitals filed for this visit.  Exam: General: Alert and awake, oriented x3, not in any acute distress. HEENT: anicteric sclera, pupils reactive to light and accommodation, EOMI CVS: S1-S2 clear, no murmur rubs or gallops Chest: clear to auscultation bilaterally, no wheezing, rales or  rhonchi Abdomen: soft nontender, nondistended, normal bowel sounds, no organomegaly Extremities: no cyanosis, clubbing or edema noted bilaterally Neuro: Cranial nerves II-XII intact, no focal neurological deficits  Data Reviewed: Basic Metabolic Panel:  Recent Labs Lab 02/05/13 1322 02/06/13 0840  NA 139 137  K 4.5 4.3  CL 105 104  CO2  --  22  GLUCOSE 82 253*  BUN 20 22  CREATININE 1.00 0.76  CALCIUM  --  9.5   Liver Function Tests: No results found for this basename: AST, ALT, ALKPHOS, BILITOT, PROT, ALBUMIN,  in the last 168 hours No results found for this basename: LIPASE, AMYLASE,  in the last 168 hours No results found for this basename: AMMONIA,  in the last 168 hours CBC:  Recent Labs Lab 02/05/13 1130 02/05/13 1322 02/06/13 0840  WBC 7.9  --  7.4  HGB 15.5* 15.6* 13.4  HCT 44.1 46.0 39.7  MCV 94.8  --  95.0  PLT 238  --  237   Cardiac Enzymes:  Recent Labs Lab 02/05/13 1900 02/06/13 0125 02/06/13 0840  TROPONINI <0.30 <0.30 <0.30   BNP (last 3 results)  Recent Labs  02/05/13 1130  PROBNP 355.2   CBG: No results found for this basename: GLUCAP,  in the last 168 hours  Micro No results found for this or any previous visit (from the past 240 hour(s)).   Studies: Dg Chest 2 View  02/05/2013   CLINICAL DATA:  79 year old female with chest pain shortness of breath cough and congestion.  EXAM: CHEST  2 VIEW  COMPARISON:  None.  FINDINGS: Lung  volumes at the upper limits of normal. Normal cardiac size and mediastinal contours. Visualized tracheal air column is within normal limits. No pneumothorax, pulmonary edema, pleural effusion or confluent pulmonary opacity. Calcified atherosclerosis of the aorta. Osteopenia and mild scoliosis. Postoperative changes to the proximal right humerus.  IMPRESSION: No active cardiopulmonary disease.   Electronically Signed   By: Augusto Gamble   On: 02/05/2013 12:09   Ct Angio Chest Pe W/cm &/or Wo Cm  02/05/2013   CLINICAL  DATA:  79 year old female chest pain and shortness of Breath.  EXAM: CT ANGIOGRAPHY CHEST WITH CONTRAST  TECHNIQUE: Multidetector CT imaging of the chest was performed using the standard protocol during bolus administration of intravenous contrast. Multiplanar CT image reconstructions including MIPs were obtained to evaluate the vascular anatomy.  CONTRAST:  80mL OMNIPAQUE IOHEXOL 350 MG/ML SOLN  COMPARISON:  Chest radiographs 02/05/2013.  FINDINGS: Good contrast bolus timing in the pulmonary arterial tree. Mild respiratory motion artifact. No focal filling defect identified in the pulmonary arterial tree to suggest the presence of acute pulmonary embolism.  Major airways are patent. Mild bilateral central peribronchial thickening. Mild right upper lobe bronchiectasis. No consolidation. No pleural effusion.  No pericardial effusion. Mild to moderate hiatal hernia. Diffuse atherosclerosis of the visualized aorta. Great vessel origins are patent. Coronary atherosclerosis. Abundant soft plaque or mural thrombus in the proximal descending thoracic aorta. Celiac origin is patent. No mediastinal lymphadenopathy.  Negative visualized upper abdominal viscera.  Mild scoliosis. Mild for age degenerative changes in the spine. No acute osseous abnormality identified.  Review of the MIP images confirms the above findings.  IMPRESSION: 1. No evidence of acute pulmonary embolus.  2. No acute findings identified. Mild peribronchial thickening. Occasional bronchiectasis. Extensive aortic atherosclerosis. Small hiatal hernia.   Electronically Signed   By: Augusto Gamble   On: 02/05/2013 14:48    Scheduled Meds: . diltiazem  120 mg Oral Daily  . guaiFENesin  1,200 mg Oral BID  . ipratropium  0.5 mg Nebulization BID  . levalbuterol  0.63 mg Nebulization BID  . levofloxacin  750 mg Intravenous Q24H  . methylPREDNISolone (SOLU-MEDROL) injection  80 mg Intravenous Q8H  . Rivaroxaban  20 mg Oral Daily  . sodium chloride  3 mL  Intravenous Q12H   Continuous Infusions: . sodium chloride 75 mL/hr at 02/06/13 0631       Time spent: 35 minutes    [facility] A  [facility] Pager [phone] If 7PM-7AM, please contact night-coverage at [email or web address], password [email or web address] 02/06/2013, 12:11 PM  LOS: 1 day

## 2013-02-06 NOTE — Plan of Care (Signed)
Problem: Phase I Progression Outcomes Goal: O2 sats > or equal 90% or at baseline Outcome: Not Progressing Patient requiring oxygen to maintain saturations above 90%.

## 2013-02-07 DIAGNOSIS — R079 Chest pain, unspecified: Secondary | ICD-10-CM | POA: Diagnosis not present

## 2013-02-07 DIAGNOSIS — J441 Chronic obstructive pulmonary disease with (acute) exacerbation: Secondary | ICD-10-CM | POA: Diagnosis not present

## 2013-02-07 DIAGNOSIS — R0902 Hypoxemia: Secondary | ICD-10-CM | POA: Diagnosis not present

## 2013-02-07 DIAGNOSIS — I4891 Unspecified atrial fibrillation: Secondary | ICD-10-CM | POA: Diagnosis not present

## 2013-02-07 LAB — BASIC METABOLIC PANEL
BUN: 20 mg/dL (ref 6–23)
Chloride: 105 mEq/L (ref 96–112)
Creatinine, Ser: 0.87 mg/dL (ref 0.50–1.10)
GFR calc Af Amer: 70 mL/min — ABNORMAL LOW (ref >90)
GFR calc non Af Amer: 60 mL/min — ABNORMAL LOW (ref >90)
Potassium: 4.2 mEq/L (ref 3.5–5.1)

## 2013-02-07 LAB — CBC
MCHC: 34.6 g/dL (ref 30.0–36.0)
RDW: 14.7 % (ref 11.5–15.5)
WBC: 10.2 10*3/uL (ref 4.0–10.5)

## 2013-02-07 MED ORDER — LEVOFLOXACIN 750 MG PO TABS
750.0000 mg | ORAL_TABLET | ORAL | Status: DC
  Filled 2013-02-07: qty 1

## 2013-02-07 NOTE — Progress Notes (Signed)
Chaplain responded to request for Advanced Directive. Patient's family was present and patient had already completed half of the AD. Chaplain explained to them the remainder of the AD, mostly concerning the Living Will. Next, chaplain gathered two witnesses from one of the waiting areas and led them to patient's room. Chaplain called notary, and when she arrived, the patient signed the AD. Chaplain made a copy of the AD for patient's chart, one for her family, and a few extras. Chaplain returned the original AD to patient. Afterwards, the patient and her family talked to chaplain about their situation and how patient would stay with them while she recovered. Family and patient expressed thanks for help with AD.

## 2013-02-07 NOTE — Progress Notes (Signed)
Patient ambulated in hall with nurse tech, Verlon Au, O2 sat maintained at 91% to 93% on room air.

## 2013-02-07 NOTE — Progress Notes (Signed)
CSW (Clinical Child psychotherapist) received consult for Proofreader. CSW spoke with pt and pt family. They already have an Advance Directive booklet and have filled out the papers. They are requesting a notary and witnesses. CSW informed spiritual care.  No further SW needs identified at this time. CSW signing off.  Rosamae Rocque, LCSWA [phone]

## 2013-02-07 NOTE — Progress Notes (Signed)
[facility] PROGRESS NOTE  Adriana Castro JYN:[id number] DOB: 07-Aug-1928 DOA: 02/05/2013 PCP: Lucianne Lei, MD  HPI/Subjective: Feels better, denies shortness of breath, fever or chills. Still has some cough, started to bring up some sputum out. She was to go home today.  Assessment/Plan: Principal Problem:   COPD with acute exacerbation Active Problems:   Hypoxia   Leaky heart valve   Chest pain   Atrial fibrillation   COPD with acute exacerbation  -Patient will be admitted to the hospital for further evaluation.  -Started on IV Levaquin and IV steroids.  -Supportive management with bronchodilators, mucolytics and antitussives.  -CT scan showed no acute abnormalities.  -On prednisone, wheezing improved. Continue current respiratory regimen.  Hypoxia  -Secondary to COPD, patient is on 2 L of oxygen. -Will ambulate today, see if patient is still hypoxic on room air.   Atrial fibrillation  -Rate is controlled with Cardizem  -Patient is anticoagulated with Xarelto.   Chest pain  -Atypical chest pain, which resolved now, likely secondary to shortness of breath/COPD exacerbation. -As mentioned above CT angio of the chest was negative for acute abnormalities.  -Chest a result, has -3 sets of cardiac enzymes, EKG showed no evidence of ischemia.  Code Status: Full code Family Communication: Plan discussed with the patient. Disposition Plan: Remains inpatient, likely discharge in a.m.   Consultants:  None  Procedures:  None  Antibiotics:  Rocephin and azithromycin   Objective: Filed Vitals:   02/07/13 1015  BP: 132/65  Pulse:   Temp:   Resp:     Intake/Output Summary (Last 24 hours) at 02/07/13 1045 Last data filed at 02/07/13 0854  Gross per 24 hour  Intake 1551.25 ml  Output      0 ml  Net 1551.25 ml   There were no vitals filed for this visit.  Exam: General: Alert and awake, oriented x3, not in any acute distress. HEENT: anicteric sclera,  pupils reactive to light and accommodation, EOMI CVS: S1-S2 clear, no murmur rubs or gallops Chest: clear to auscultation bilaterally, no wheezing, rales or rhonchi Abdomen: soft nontender, nondistended, normal bowel sounds, no organomegaly Extremities: no cyanosis, clubbing or edema noted bilaterally Neuro: Cranial nerves II-XII intact, no focal neurological deficits  Data Reviewed: Basic Metabolic Panel:  Recent Labs Lab 02/05/13 1322 02/06/13 0840 02/07/13 0515  NA 139 137 139  K 4.5 4.3 4.2  CL 105 104 105  CO2  --  22 25  GLUCOSE 82 253* 135*  BUN 20 22 20   CREATININE 1.00 0.76 0.87  CALCIUM  --  9.5 9.5   Liver Function Tests: No results found for this basename: AST, ALT, ALKPHOS, BILITOT, PROT, ALBUMIN,  in the last 168 hours No results found for this basename: LIPASE, AMYLASE,  in the last 168 hours No results found for this basename: AMMONIA,  in the last 168 hours CBC:  Recent Labs Lab 02/05/13 1130 02/05/13 1322 02/06/13 0840 02/07/13 0515  WBC 7.9  --  7.4 10.2  HGB 15.5* 15.6* 13.4 12.6  HCT 44.1 46.0 39.7 36.4  MCV 94.8  --  95.0 94.8  PLT 238  --  237 227   Cardiac Enzymes:  Recent Labs Lab 02/05/13 1900 02/06/13 0125 02/06/13 0840  TROPONINI <0.30 <0.30 <0.30   BNP (last 3 results)  Recent Labs  02/05/13 1130  PROBNP 355.2   CBG: No results found for this basename: GLUCAP,  in the last 168 hours  Micro No results found for this or any previous  visit (from the past 240 hour(s)).   Studies: Dg Chest 2 View  02/05/2013   CLINICAL DATA:  79 year old female with chest pain shortness of breath cough and congestion.  EXAM: CHEST  2 VIEW  COMPARISON:  None.  FINDINGS: Lung volumes at the upper limits of normal. Normal cardiac size and mediastinal contours. Visualized tracheal air column is within normal limits. No pneumothorax, pulmonary edema, pleural effusion or confluent pulmonary opacity. Calcified atherosclerosis of the aorta. Osteopenia  and mild scoliosis. Postoperative changes to the proximal right humerus.  IMPRESSION: No active cardiopulmonary disease.   Electronically Signed   By: Augusto Gamble   On: 02/05/2013 12:09   Ct Angio Chest Pe W/cm &/or Wo Cm  02/05/2013   CLINICAL DATA:  79 year old female chest pain and shortness of Breath.  EXAM: CT ANGIOGRAPHY CHEST WITH CONTRAST  TECHNIQUE: Multidetector CT imaging of the chest was performed using the standard protocol during bolus administration of intravenous contrast. Multiplanar CT image reconstructions including MIPs were obtained to evaluate the vascular anatomy.  CONTRAST:  80mL OMNIPAQUE IOHEXOL 350 MG/ML SOLN  COMPARISON:  Chest radiographs 02/05/2013.  FINDINGS: Good contrast bolus timing in the pulmonary arterial tree. Mild respiratory motion artifact. No focal filling defect identified in the pulmonary arterial tree to suggest the presence of acute pulmonary embolism.  Major airways are patent. Mild bilateral central peribronchial thickening. Mild right upper lobe bronchiectasis. No consolidation. No pleural effusion.  No pericardial effusion. Mild to moderate hiatal hernia. Diffuse atherosclerosis of the visualized aorta. Great vessel origins are patent. Coronary atherosclerosis. Abundant soft plaque or mural thrombus in the proximal descending thoracic aorta. Celiac origin is patent. No mediastinal lymphadenopathy.  Negative visualized upper abdominal viscera.  Mild scoliosis. Mild for age degenerative changes in the spine. No acute osseous abnormality identified.  Review of the MIP images confirms the above findings.  IMPRESSION: 1. No evidence of acute pulmonary embolus.  2. No acute findings identified. Mild peribronchial thickening. Occasional bronchiectasis. Extensive aortic atherosclerosis. Small hiatal hernia.   Electronically Signed   By: Augusto Gamble   On: 02/05/2013 14:48    Scheduled Meds: . diltiazem  120 mg Oral Daily  . guaiFENesin  1,200 mg Oral BID  . ipratropium   0.5 mg Nebulization BID  . levalbuterol  0.63 mg Nebulization BID  . levofloxacin  750 mg Oral Daily  . predniSONE  60 mg Oral Q breakfast  . Rivaroxaban  20 mg Oral Daily  . sodium chloride  3 mL Intravenous Q12H   Continuous Infusions:       Time spent: 35 minutes    [facility] A  [facility] Pager [phone] If 7PM-7AM, please contact night-coverage at [email or web address], password [facility] 02/07/2013, 10:45 AM  LOS: 2 days

## 2013-02-08 DIAGNOSIS — J441 Chronic obstructive pulmonary disease with (acute) exacerbation: Secondary | ICD-10-CM | POA: Diagnosis not present

## 2013-02-08 DIAGNOSIS — I4891 Unspecified atrial fibrillation: Secondary | ICD-10-CM | POA: Diagnosis not present

## 2013-02-08 DIAGNOSIS — R0902 Hypoxemia: Secondary | ICD-10-CM | POA: Diagnosis not present

## 2013-02-08 DIAGNOSIS — R079 Chest pain, unspecified: Secondary | ICD-10-CM | POA: Diagnosis not present

## 2013-02-08 MED ORDER — PREDNISONE 10 MG PO TABS: ORAL_TABLET | ORAL | Status: DC

## 2013-02-08 MED ORDER — GUAIFENESIN ER 600 MG PO TB12: 1200.0000 mg | ORAL_TABLET | Freq: Two times a day (BID) | ORAL | Status: DC

## 2013-02-08 MED ORDER — LEVOFLOXACIN 250 MG PO TABS: 750.0000 mg | ORAL_TABLET | ORAL | Status: DC

## 2013-02-08 NOTE — Discharge Summary (Signed)
Physician Discharge Summary  Adriana Castro ZOX:[id number] DOB: [birth year] DOA: 02/05/2013  PCP: Adriana Lei, MD  Admit date: 02/05/2013 Discharge date: 02/08/2013  Time spent: 40 minutes  Recommendations for Outpatient Follow-up:  1. Followup with primary care physician within one week  Discharge Diagnoses:  Principal Problem:   COPD with acute exacerbation Active Problems:   Hypoxia   Leaky heart valve   Chest pain   Atrial fibrillation   Discharge Condition: Stable  Diet recommendation: Heart healthy diet  Filed Weights   02/07/13 1035 02/08/13 0458  Weight: 62.687 kg (138 lb 3.2 oz) 61.916 kg (136 lb 8 oz)    History of present illness:  Adriana Castro is a 79 y.o. female was past medical history of atrial fibrillation, COPD and leaky heart filed. Patient came in to the hospital because of shortness of breath and chest pain. Patient lives [place], [place], she stated she was in her usual state of health till yesterday when she started to have some shortness of breath, wheezing, cough with minimal sputum production. Last night she developed stabbing chest pain that goes from her epigastric/substernal area to the back. The pain was 6/10 in intensity, no radiation lasted for about an hour. Patient came into the ED for further evaluation.  In the ED initial evaluation showed positive d-dimer is, CT angio was done and showed no evidence of PE or dissection. She has negative cardiac enzymes, negative EKG. She was hypoxic on room air, patient admitted to the hospital for COPD exacerbation.  Hospital Course:   1. COPD with acute exacerbation: Patient admitted to the hospital because of shortness of breath, wheezing and sputum production. Started initially on IV antibiotics, IV Levaquin and IV steroids. Supportive management including bronchodilators, mucolytics and antitussives started. Patient did have some chest pain and was evaluated by CT angio of the chest which showed no acute abnormalities.  On the day of discharge patient discharged on Levaquin to complete 7 days of antibiotics, this is renally adjusted. Prednisone taper and Mucinex.  2. Hypoxia: Secondary to COPD, patient oxygen saturation was in the 80s that self admission, patient was placed on 2 L of oxygen. She was ambulated while she is in the hospital and she did not need oxygen on discharge.  3. Chest pain: Atypical chest pain, substernal but sharp and increased with deep breath. Patient had positive d-dimer is, CT angio of the chest was done and showed no acute abnormalities. Patient also has -3 sets of cardiac enzymes and EKG showed no evidence of ischemia.  4. Atrial fibrillation: Rate is controlled with Cardizem, patient is anticoagulated with Xarelto.  Procedures:  None  Consultations:  None  Discharge Exam: Filed Vitals:   02/08/13 0902  BP: 153/72  Pulse: 73  Temp: 97.8 F (36.6 C)  Resp: 16   General: Alert and awake, oriented x3, not in any acute distress. HEENT: anicteric sclera, pupils reactive to light and accommodation, EOMI CVS: S1-S2 clear, no murmur rubs or gallops Chest: clear to auscultation bilaterally, no wheezing, rales or rhonchi Abdomen: soft nontender, nondistended, normal bowel sounds, no organomegaly Extremities: no cyanosis, clubbing or edema noted bilaterally Neuro: Cranial nerves II-XII intact, no focal neurological deficits  Discharge Instructions  Discharge Orders   Future Orders Complete By Expires   Diet - low sodium heart healthy  As directed    Increase activity slowly  As directed        Medication List         diltiazem 120 MG tablet  Commonly known as:  CARDIZEM  Take 120 mg by mouth daily.     guaiFENesin 600 MG 12 hr tablet  Commonly known as:  MUCINEX  Take 2 tablets (1,200 mg total) by mouth 2 (two) times daily.     ipratropium 0.02 % nebulizer solution  Commonly known as:  ATROVENT  Take 500 mcg by nebulization daily as needed for wheezing.      levalbuterol 45 MCG/ACT inhaler  Commonly known as:  XOPENEX HFA  Inhale 1-2 puffs into the lungs every 4 (four) hours as needed for wheezing or shortness of breath.     levofloxacin 250 MG tablet  Commonly known as:  LEVAQUIN  Take 3 tablets (750 mg total) by mouth every other day.     predniSONE 10 MG tablet  Commonly known as:  DELTASONE  Take 6-5-4-3-2-1 PO daily till gone     tiotropium 18 MCG inhalation capsule  Commonly known as:  SPIRIVA  Place 18 mcg into inhaler and inhale daily.     XARELTO 20 MG Tabs tablet  Generic drug:  Rivaroxaban  Take 20 mg by mouth daily.       Allergies  Allergen Reactions  . Albuterol Nausea And Vomiting       Follow-up Information   Follow up with Adriana Lei, MD In 1 week.   Specialty:  Internal Medicine   Contact information:   [address] [place] [address] [phone]        The results of significant diagnostics from this hospitalization (including imaging, microbiology, ancillary and laboratory) are listed below for reference.    Significant Diagnostic Studies: Dg Chest 2 View  02/05/2013   CLINICAL DATA:  79 year old female with chest pain shortness of breath cough and congestion.  EXAM: CHEST  2 VIEW  COMPARISON:  None.  FINDINGS: Lung volumes at the upper limits of normal. Normal cardiac size and mediastinal contours. Visualized tracheal air column is within normal limits. No pneumothorax, pulmonary edema, pleural effusion or confluent pulmonary opacity. Calcified atherosclerosis of the aorta. Osteopenia and mild scoliosis. Postoperative changes to the proximal right humerus.  IMPRESSION: No active cardiopulmonary disease.   Electronically Signed   By: Augusto Gamble   On: 02/05/2013 12:09   Ct Angio Chest Pe W/cm &/or Wo Cm  02/05/2013   CLINICAL DATA:  79 year old female chest pain and shortness of Breath.  EXAM: CT ANGIOGRAPHY CHEST WITH CONTRAST  TECHNIQUE: Multidetector CT imaging of the chest was performed  using the standard protocol during bolus administration of intravenous contrast. Multiplanar CT image reconstructions including MIPs were obtained to evaluate the vascular anatomy.  CONTRAST:  80mL OMNIPAQUE IOHEXOL 350 MG/ML SOLN  COMPARISON:  Chest radiographs 02/05/2013.  FINDINGS: Good contrast bolus timing in the pulmonary arterial tree. Mild respiratory motion artifact. No focal filling defect identified in the pulmonary arterial tree to suggest the presence of acute pulmonary embolism.  Major airways are patent. Mild bilateral central peribronchial thickening. Mild right upper lobe bronchiectasis. No consolidation. No pleural effusion.  No pericardial effusion. Mild to moderate hiatal hernia. Diffuse atherosclerosis of the visualized aorta. Great vessel origins are patent. Coronary atherosclerosis. Abundant soft plaque or mural thrombus in the proximal descending thoracic aorta. Celiac origin is patent. No mediastinal lymphadenopathy.  Negative visualized upper abdominal viscera.  Mild scoliosis. Mild for age degenerative changes in the spine. No acute osseous abnormality identified.  Review of the MIP images confirms the above findings.  IMPRESSION: 1. No evidence of acute pulmonary embolus.  2. No acute findings identified. Mild peribronchial thickening. Occasional bronchiectasis. Extensive aortic atherosclerosis. Small hiatal hernia.   Electronically Signed   By: Augusto Gamble   On: 02/05/2013 14:48    Microbiology: No results found for this or any previous visit (from the past 240 hour(s)).   Labs: Basic Metabolic Panel:  Recent Labs Lab 02/05/13 1322 02/06/13 0840 02/07/13 0515  NA 139 137 139  K 4.5 4.3 4.2  CL 105 104 105  CO2  --  22 25  GLUCOSE 82 253* 135*  BUN 20 22 20   CREATININE 1.00 0.76 0.87  CALCIUM  --  9.5 9.5   Liver Function Tests: No results found for this basename: AST, ALT, ALKPHOS, BILITOT, PROT, ALBUMIN,  in the last 168 hours No results found for this basename:  LIPASE, AMYLASE,  in the last 168 hours No results found for this basename: AMMONIA,  in the last 168 hours CBC:  Recent Labs Lab 02/05/13 1130 02/05/13 1322 02/06/13 0840 02/07/13 0515  WBC 7.9  --  7.4 10.2  HGB 15.5* 15.6* 13.4 12.6  HCT 44.1 46.0 39.7 36.4  MCV 94.8  --  95.0 94.8  PLT 238  --  237 227   Cardiac Enzymes:  Recent Labs Lab 02/05/13 1900 02/06/13 0125 02/06/13 0840  TROPONINI <0.30 <0.30 <0.30   BNP: BNP (last 3 results)  Recent Labs  02/05/13 1130  PROBNP 355.2   CBG: No results found for this basename: GLUCAP,  in the last 168 hours     Signed:  Ashantia Castro A  [facility] 02/08/2013, 11:01 AM

## 2013-02-19 DIAGNOSIS — F39 Unspecified mood [affective] disorder: Secondary | ICD-10-CM | POA: Diagnosis not present

## 2013-02-19 DIAGNOSIS — I4891 Unspecified atrial fibrillation: Secondary | ICD-10-CM | POA: Diagnosis not present

## 2013-02-19 DIAGNOSIS — Z09 Encounter for follow-up examination after completed treatment for conditions other than malignant neoplasm: Secondary | ICD-10-CM | POA: Diagnosis not present

## 2013-02-19 DIAGNOSIS — Z23 Encounter for immunization: Secondary | ICD-10-CM | POA: Diagnosis not present

## 2013-02-19 DIAGNOSIS — J449 Chronic obstructive pulmonary disease, unspecified: Secondary | ICD-10-CM | POA: Diagnosis not present

## 2013-03-04 DIAGNOSIS — I4891 Unspecified atrial fibrillation: Secondary | ICD-10-CM | POA: Diagnosis not present

## 2013-03-04 DIAGNOSIS — I10 Essential (primary) hypertension: Secondary | ICD-10-CM | POA: Diagnosis not present

## 2013-03-06 DIAGNOSIS — J309 Allergic rhinitis, unspecified: Secondary | ICD-10-CM | POA: Diagnosis not present

## 2013-03-06 DIAGNOSIS — J449 Chronic obstructive pulmonary disease, unspecified: Secondary | ICD-10-CM | POA: Diagnosis not present

## 2013-03-06 DIAGNOSIS — F39 Unspecified mood [affective] disorder: Secondary | ICD-10-CM | POA: Diagnosis not present

## 2013-03-06 DIAGNOSIS — E559 Vitamin D deficiency, unspecified: Secondary | ICD-10-CM | POA: Diagnosis not present

## 2013-03-26 DIAGNOSIS — R109 Unspecified abdominal pain: Secondary | ICD-10-CM | POA: Diagnosis not present

## 2013-03-26 DIAGNOSIS — I4891 Unspecified atrial fibrillation: Secondary | ICD-10-CM | POA: Diagnosis not present

## 2013-03-31 DIAGNOSIS — R109 Unspecified abdominal pain: Secondary | ICD-10-CM | POA: Diagnosis not present

## 2013-03-31 DIAGNOSIS — I714 Abdominal aortic aneurysm, without rupture, unspecified: Secondary | ICD-10-CM | POA: Diagnosis not present

## 2013-04-04 DIAGNOSIS — N281 Cyst of kidney, acquired: Secondary | ICD-10-CM | POA: Diagnosis not present

## 2013-04-04 DIAGNOSIS — N289 Disorder of kidney and ureter, unspecified: Secondary | ICD-10-CM | POA: Diagnosis not present

## 2013-04-04 DIAGNOSIS — K7689 Other specified diseases of liver: Secondary | ICD-10-CM | POA: Diagnosis not present

## 2013-04-09 DIAGNOSIS — I10 Essential (primary) hypertension: Secondary | ICD-10-CM | POA: Diagnosis not present

## 2013-04-09 DIAGNOSIS — I4891 Unspecified atrial fibrillation: Secondary | ICD-10-CM | POA: Diagnosis not present

## 2013-04-22 DIAGNOSIS — J449 Chronic obstructive pulmonary disease, unspecified: Secondary | ICD-10-CM | POA: Diagnosis not present

## 2013-04-22 DIAGNOSIS — I714 Abdominal aortic aneurysm, without rupture, unspecified: Secondary | ICD-10-CM | POA: Diagnosis not present

## 2013-04-22 DIAGNOSIS — I4891 Unspecified atrial fibrillation: Secondary | ICD-10-CM | POA: Diagnosis not present

## 2013-04-22 DIAGNOSIS — F39 Unspecified mood [affective] disorder: Secondary | ICD-10-CM | POA: Diagnosis not present

## 2013-04-22 DIAGNOSIS — E785 Hyperlipidemia, unspecified: Secondary | ICD-10-CM | POA: Diagnosis not present

## 2013-05-12 DIAGNOSIS — I10 Essential (primary) hypertension: Secondary | ICD-10-CM | POA: Diagnosis not present

## 2013-05-12 DIAGNOSIS — I714 Abdominal aortic aneurysm, without rupture, unspecified: Secondary | ICD-10-CM | POA: Diagnosis not present

## 2013-05-12 DIAGNOSIS — J449 Chronic obstructive pulmonary disease, unspecified: Secondary | ICD-10-CM | POA: Diagnosis not present

## 2013-05-15 DIAGNOSIS — I714 Abdominal aortic aneurysm, without rupture, unspecified: Secondary | ICD-10-CM | POA: Diagnosis not present

## 2013-05-15 DIAGNOSIS — N281 Cyst of kidney, acquired: Secondary | ICD-10-CM | POA: Diagnosis not present

## 2013-05-19 DIAGNOSIS — I714 Abdominal aortic aneurysm, without rupture, unspecified: Secondary | ICD-10-CM | POA: Diagnosis not present

## 2013-05-19 DIAGNOSIS — R1900 Intra-abdominal and pelvic swelling, mass and lump, unspecified site: Secondary | ICD-10-CM | POA: Diagnosis not present

## 2013-05-20 DIAGNOSIS — I4891 Unspecified atrial fibrillation: Secondary | ICD-10-CM | POA: Diagnosis not present

## 2013-05-20 DIAGNOSIS — J449 Chronic obstructive pulmonary disease, unspecified: Secondary | ICD-10-CM | POA: Diagnosis not present

## 2013-05-20 DIAGNOSIS — R599 Enlarged lymph nodes, unspecified: Secondary | ICD-10-CM | POA: Diagnosis not present

## 2013-05-20 DIAGNOSIS — R1900 Intra-abdominal and pelvic swelling, mass and lump, unspecified site: Secondary | ICD-10-CM | POA: Diagnosis not present

## 2013-05-30 DIAGNOSIS — I714 Abdominal aortic aneurysm, without rupture, unspecified: Secondary | ICD-10-CM | POA: Diagnosis not present

## 2013-05-30 DIAGNOSIS — J449 Chronic obstructive pulmonary disease, unspecified: Secondary | ICD-10-CM | POA: Diagnosis not present

## 2013-05-30 DIAGNOSIS — Z7901 Long term (current) use of anticoagulants: Secondary | ICD-10-CM | POA: Diagnosis not present

## 2013-05-30 DIAGNOSIS — R599 Enlarged lymph nodes, unspecified: Secondary | ICD-10-CM | POA: Diagnosis not present

## 2013-05-30 DIAGNOSIS — Z79899 Other long term (current) drug therapy: Secondary | ICD-10-CM | POA: Diagnosis not present

## 2013-05-30 DIAGNOSIS — I4891 Unspecified atrial fibrillation: Secondary | ICD-10-CM | POA: Diagnosis not present

## 2013-05-30 DIAGNOSIS — C801 Malignant (primary) neoplasm, unspecified: Secondary | ICD-10-CM | POA: Diagnosis not present

## 2013-05-30 DIAGNOSIS — I10 Essential (primary) hypertension: Secondary | ICD-10-CM | POA: Diagnosis not present

## 2013-05-30 DIAGNOSIS — C775 Secondary and unspecified malignant neoplasm of intrapelvic lymph nodes: Secondary | ICD-10-CM | POA: Diagnosis not present

## 2013-05-30 DIAGNOSIS — C569 Malignant neoplasm of unspecified ovary: Secondary | ICD-10-CM | POA: Diagnosis not present

## 2013-06-09 DIAGNOSIS — C569 Malignant neoplasm of unspecified ovary: Secondary | ICD-10-CM | POA: Diagnosis not present

## 2013-06-16 DIAGNOSIS — C569 Malignant neoplasm of unspecified ovary: Secondary | ICD-10-CM | POA: Diagnosis not present

## 2013-06-18 DIAGNOSIS — C569 Malignant neoplasm of unspecified ovary: Secondary | ICD-10-CM | POA: Diagnosis not present

## 2013-06-18 DIAGNOSIS — Z5111 Encounter for antineoplastic chemotherapy: Secondary | ICD-10-CM | POA: Diagnosis not present

## 2013-06-25 DIAGNOSIS — F319 Bipolar disorder, unspecified: Secondary | ICD-10-CM | POA: Diagnosis not present

## 2013-06-25 DIAGNOSIS — C569 Malignant neoplasm of unspecified ovary: Secondary | ICD-10-CM | POA: Diagnosis not present

## 2013-06-25 DIAGNOSIS — I4891 Unspecified atrial fibrillation: Secondary | ICD-10-CM | POA: Diagnosis not present

## 2013-06-25 DIAGNOSIS — J449 Chronic obstructive pulmonary disease, unspecified: Secondary | ICD-10-CM | POA: Diagnosis not present

## 2013-07-09 DIAGNOSIS — Z5111 Encounter for antineoplastic chemotherapy: Secondary | ICD-10-CM | POA: Diagnosis not present

## 2013-07-09 DIAGNOSIS — C569 Malignant neoplasm of unspecified ovary: Secondary | ICD-10-CM | POA: Diagnosis not present

## 2013-07-30 DIAGNOSIS — C569 Malignant neoplasm of unspecified ovary: Secondary | ICD-10-CM | POA: Diagnosis not present

## 2013-07-30 DIAGNOSIS — Z5111 Encounter for antineoplastic chemotherapy: Secondary | ICD-10-CM | POA: Diagnosis not present

## 2013-08-13 DIAGNOSIS — I4891 Unspecified atrial fibrillation: Secondary | ICD-10-CM | POA: Diagnosis not present

## 2013-08-13 DIAGNOSIS — I10 Essential (primary) hypertension: Secondary | ICD-10-CM | POA: Diagnosis not present

## 2013-08-20 DIAGNOSIS — R52 Pain, unspecified: Secondary | ICD-10-CM | POA: Diagnosis not present

## 2013-08-20 DIAGNOSIS — Z5111 Encounter for antineoplastic chemotherapy: Secondary | ICD-10-CM | POA: Diagnosis not present

## 2013-08-20 DIAGNOSIS — C569 Malignant neoplasm of unspecified ovary: Secondary | ICD-10-CM | POA: Diagnosis not present

## 2013-09-03 DIAGNOSIS — G894 Chronic pain syndrome: Secondary | ICD-10-CM | POA: Diagnosis not present

## 2013-09-03 DIAGNOSIS — Z01818 Encounter for other preprocedural examination: Secondary | ICD-10-CM | POA: Diagnosis not present

## 2013-09-03 DIAGNOSIS — J449 Chronic obstructive pulmonary disease, unspecified: Secondary | ICD-10-CM | POA: Diagnosis not present

## 2013-09-03 DIAGNOSIS — I4891 Unspecified atrial fibrillation: Secondary | ICD-10-CM | POA: Diagnosis not present

## 2013-09-10 DIAGNOSIS — R599 Enlarged lymph nodes, unspecified: Secondary | ICD-10-CM | POA: Diagnosis not present

## 2013-09-10 DIAGNOSIS — I4891 Unspecified atrial fibrillation: Secondary | ICD-10-CM

## 2013-09-10 DIAGNOSIS — E785 Hyperlipidemia, unspecified: Secondary | ICD-10-CM

## 2013-09-10 DIAGNOSIS — I498 Other specified cardiac arrhythmias: Secondary | ICD-10-CM | POA: Diagnosis not present

## 2013-09-10 DIAGNOSIS — D3500 Benign neoplasm of unspecified adrenal gland: Secondary | ICD-10-CM

## 2013-09-10 DIAGNOSIS — K219 Gastro-esophageal reflux disease without esophagitis: Secondary | ICD-10-CM | POA: Insufficient documentation

## 2013-09-10 DIAGNOSIS — I10 Essential (primary) hypertension: Secondary | ICD-10-CM | POA: Insufficient documentation

## 2013-09-10 DIAGNOSIS — F329 Major depressive disorder, single episode, unspecified: Secondary | ICD-10-CM | POA: Insufficient documentation

## 2013-09-10 DIAGNOSIS — I729 Aneurysm of unspecified site: Secondary | ICD-10-CM

## 2013-09-10 DIAGNOSIS — F32A Depression, unspecified: Secondary | ICD-10-CM

## 2013-09-10 DIAGNOSIS — C569 Malignant neoplasm of unspecified ovary: Secondary | ICD-10-CM | POA: Diagnosis not present

## 2013-09-10 DIAGNOSIS — R52 Pain, unspecified: Secondary | ICD-10-CM | POA: Diagnosis not present

## 2013-09-10 DIAGNOSIS — Z01818 Encounter for other preprocedural examination: Secondary | ICD-10-CM | POA: Diagnosis not present

## 2013-09-10 DIAGNOSIS — I714 Abdominal aortic aneurysm, without rupture, unspecified: Secondary | ICD-10-CM | POA: Diagnosis not present

## 2013-09-10 DIAGNOSIS — F419 Anxiety disorder, unspecified: Secondary | ICD-10-CM | POA: Insufficient documentation

## 2013-09-10 HISTORY — DX: Major depressive disorder, single episode, unspecified: F32.9

## 2013-09-10 HISTORY — DX: Essential (primary) hypertension: I10

## 2013-09-10 HISTORY — DX: Hyperlipidemia, unspecified: E78.5

## 2013-09-10 HISTORY — DX: Benign neoplasm of unspecified adrenal gland: D35.00

## 2013-09-10 HISTORY — DX: Unspecified atrial fibrillation: I48.91

## 2013-09-10 HISTORY — DX: Depression, unspecified: F32.A

## 2013-09-10 HISTORY — DX: Aneurysm of unspecified site: I72.9

## 2013-09-23 DIAGNOSIS — R0789 Other chest pain: Secondary | ICD-10-CM | POA: Diagnosis not present

## 2013-09-23 DIAGNOSIS — Z9221 Personal history of antineoplastic chemotherapy: Secondary | ICD-10-CM | POA: Diagnosis not present

## 2013-09-23 DIAGNOSIS — R0902 Hypoxemia: Secondary | ICD-10-CM | POA: Diagnosis not present

## 2013-09-23 DIAGNOSIS — E785 Hyperlipidemia, unspecified: Secondary | ICD-10-CM | POA: Diagnosis present

## 2013-09-23 DIAGNOSIS — J449 Chronic obstructive pulmonary disease, unspecified: Secondary | ICD-10-CM | POA: Diagnosis not present

## 2013-09-23 DIAGNOSIS — I4891 Unspecified atrial fibrillation: Secondary | ICD-10-CM | POA: Diagnosis not present

## 2013-09-23 DIAGNOSIS — I714 Abdominal aortic aneurysm, without rupture, unspecified: Secondary | ICD-10-CM | POA: Diagnosis present

## 2013-09-23 DIAGNOSIS — J90 Pleural effusion, not elsewhere classified: Secondary | ICD-10-CM | POA: Diagnosis not present

## 2013-09-23 DIAGNOSIS — F411 Generalized anxiety disorder: Secondary | ICD-10-CM | POA: Diagnosis present

## 2013-09-23 DIAGNOSIS — I519 Heart disease, unspecified: Secondary | ICD-10-CM | POA: Diagnosis not present

## 2013-09-23 DIAGNOSIS — N135 Crossing vessel and stricture of ureter without hydronephrosis: Secondary | ICD-10-CM | POA: Diagnosis not present

## 2013-09-23 DIAGNOSIS — J438 Other emphysema: Secondary | ICD-10-CM | POA: Diagnosis not present

## 2013-09-23 DIAGNOSIS — K219 Gastro-esophageal reflux disease without esophagitis: Secondary | ICD-10-CM | POA: Diagnosis present

## 2013-09-23 DIAGNOSIS — C801 Malignant (primary) neoplasm, unspecified: Secondary | ICD-10-CM | POA: Diagnosis not present

## 2013-09-23 DIAGNOSIS — J4489 Other specified chronic obstructive pulmonary disease: Secondary | ICD-10-CM | POA: Diagnosis not present

## 2013-09-23 DIAGNOSIS — Z5189 Encounter for other specified aftercare: Secondary | ICD-10-CM | POA: Diagnosis not present

## 2013-09-23 DIAGNOSIS — I10 Essential (primary) hypertension: Secondary | ICD-10-CM | POA: Diagnosis present

## 2013-09-23 DIAGNOSIS — C569 Malignant neoplasm of unspecified ovary: Secondary | ICD-10-CM | POA: Diagnosis not present

## 2013-09-23 DIAGNOSIS — Z4889 Encounter for other specified surgical aftercare: Secondary | ICD-10-CM | POA: Diagnosis not present

## 2013-09-28 DIAGNOSIS — C569 Malignant neoplasm of unspecified ovary: Secondary | ICD-10-CM | POA: Insufficient documentation

## 2013-09-28 HISTORY — DX: Malignant neoplasm of unspecified ovary: C56.9

## 2013-09-29 DIAGNOSIS — K219 Gastro-esophageal reflux disease without esophagitis: Secondary | ICD-10-CM | POA: Diagnosis not present

## 2013-09-29 DIAGNOSIS — I714 Abdominal aortic aneurysm, without rupture, unspecified: Secondary | ICD-10-CM | POA: Diagnosis not present

## 2013-09-29 DIAGNOSIS — C569 Malignant neoplasm of unspecified ovary: Secondary | ICD-10-CM | POA: Diagnosis not present

## 2013-09-29 DIAGNOSIS — Z4889 Encounter for other specified surgical aftercare: Secondary | ICD-10-CM | POA: Diagnosis not present

## 2013-09-29 DIAGNOSIS — J438 Other emphysema: Secondary | ICD-10-CM | POA: Diagnosis not present

## 2013-09-29 DIAGNOSIS — C801 Malignant (primary) neoplasm, unspecified: Secondary | ICD-10-CM | POA: Diagnosis not present

## 2013-09-29 DIAGNOSIS — D638 Anemia in other chronic diseases classified elsewhere: Secondary | ICD-10-CM | POA: Diagnosis not present

## 2013-09-29 DIAGNOSIS — Z483 Aftercare following surgery for neoplasm: Secondary | ICD-10-CM | POA: Diagnosis not present

## 2013-09-29 DIAGNOSIS — Z5189 Encounter for other specified aftercare: Secondary | ICD-10-CM | POA: Diagnosis not present

## 2013-09-29 DIAGNOSIS — J9620 Acute and chronic respiratory failure, unspecified whether with hypoxia or hypercapnia: Secondary | ICD-10-CM | POA: Diagnosis not present

## 2013-09-29 DIAGNOSIS — I4891 Unspecified atrial fibrillation: Secondary | ICD-10-CM | POA: Diagnosis not present

## 2013-09-29 DIAGNOSIS — J449 Chronic obstructive pulmonary disease, unspecified: Secondary | ICD-10-CM | POA: Diagnosis not present

## 2013-09-29 DIAGNOSIS — G8918 Other acute postprocedural pain: Secondary | ICD-10-CM | POA: Diagnosis not present

## 2013-10-07 DIAGNOSIS — J9620 Acute and chronic respiratory failure, unspecified whether with hypoxia or hypercapnia: Secondary | ICD-10-CM | POA: Diagnosis not present

## 2013-10-07 DIAGNOSIS — I4891 Unspecified atrial fibrillation: Secondary | ICD-10-CM | POA: Diagnosis not present

## 2013-10-07 DIAGNOSIS — G8918 Other acute postprocedural pain: Secondary | ICD-10-CM | POA: Diagnosis not present

## 2013-10-07 DIAGNOSIS — D638 Anemia in other chronic diseases classified elsewhere: Secondary | ICD-10-CM | POA: Diagnosis not present

## 2013-10-11 DIAGNOSIS — Z483 Aftercare following surgery for neoplasm: Secondary | ICD-10-CM | POA: Diagnosis not present

## 2013-10-11 DIAGNOSIS — C569 Malignant neoplasm of unspecified ovary: Secondary | ICD-10-CM | POA: Diagnosis not present

## 2013-10-13 DIAGNOSIS — Z9889 Other specified postprocedural states: Secondary | ICD-10-CM | POA: Diagnosis not present

## 2013-10-13 DIAGNOSIS — C569 Malignant neoplasm of unspecified ovary: Secondary | ICD-10-CM | POA: Diagnosis not present

## 2013-10-14 DIAGNOSIS — C569 Malignant neoplasm of unspecified ovary: Secondary | ICD-10-CM | POA: Diagnosis not present

## 2013-10-14 DIAGNOSIS — Z483 Aftercare following surgery for neoplasm: Secondary | ICD-10-CM | POA: Diagnosis not present

## 2013-10-15 DIAGNOSIS — I714 Abdominal aortic aneurysm, without rupture, unspecified: Secondary | ICD-10-CM | POA: Diagnosis not present

## 2013-10-15 DIAGNOSIS — I4891 Unspecified atrial fibrillation: Secondary | ICD-10-CM | POA: Diagnosis not present

## 2013-10-15 DIAGNOSIS — I10 Essential (primary) hypertension: Secondary | ICD-10-CM | POA: Diagnosis not present

## 2013-10-16 DIAGNOSIS — J019 Acute sinusitis, unspecified: Secondary | ICD-10-CM | POA: Diagnosis not present

## 2013-10-16 DIAGNOSIS — C569 Malignant neoplasm of unspecified ovary: Secondary | ICD-10-CM | POA: Diagnosis not present

## 2013-10-16 DIAGNOSIS — J449 Chronic obstructive pulmonary disease, unspecified: Secondary | ICD-10-CM | POA: Diagnosis not present

## 2013-10-16 DIAGNOSIS — Z483 Aftercare following surgery for neoplasm: Secondary | ICD-10-CM | POA: Diagnosis not present

## 2013-10-16 DIAGNOSIS — J209 Acute bronchitis, unspecified: Secondary | ICD-10-CM | POA: Diagnosis not present

## 2013-10-17 DIAGNOSIS — C569 Malignant neoplasm of unspecified ovary: Secondary | ICD-10-CM | POA: Diagnosis not present

## 2013-10-17 DIAGNOSIS — Z483 Aftercare following surgery for neoplasm: Secondary | ICD-10-CM | POA: Diagnosis not present

## 2013-10-21 DIAGNOSIS — C569 Malignant neoplasm of unspecified ovary: Secondary | ICD-10-CM | POA: Diagnosis not present

## 2013-10-21 DIAGNOSIS — Z483 Aftercare following surgery for neoplasm: Secondary | ICD-10-CM | POA: Diagnosis not present

## 2013-10-23 DIAGNOSIS — Z483 Aftercare following surgery for neoplasm: Secondary | ICD-10-CM | POA: Diagnosis not present

## 2013-10-23 DIAGNOSIS — C569 Malignant neoplasm of unspecified ovary: Secondary | ICD-10-CM | POA: Diagnosis not present

## 2013-10-27 DIAGNOSIS — C569 Malignant neoplasm of unspecified ovary: Secondary | ICD-10-CM | POA: Diagnosis not present

## 2013-10-28 DIAGNOSIS — C569 Malignant neoplasm of unspecified ovary: Secondary | ICD-10-CM | POA: Diagnosis not present

## 2013-10-28 DIAGNOSIS — Z483 Aftercare following surgery for neoplasm: Secondary | ICD-10-CM | POA: Diagnosis not present

## 2013-11-05 DIAGNOSIS — I729 Aneurysm of unspecified site: Secondary | ICD-10-CM | POA: Diagnosis not present

## 2013-11-05 DIAGNOSIS — I10 Essential (primary) hypertension: Secondary | ICD-10-CM | POA: Diagnosis not present

## 2013-11-05 DIAGNOSIS — K219 Gastro-esophageal reflux disease without esophagitis: Secondary | ICD-10-CM | POA: Diagnosis not present

## 2013-11-05 DIAGNOSIS — E785 Hyperlipidemia, unspecified: Secondary | ICD-10-CM | POA: Diagnosis not present

## 2013-11-05 DIAGNOSIS — J449 Chronic obstructive pulmonary disease, unspecified: Secondary | ICD-10-CM | POA: Diagnosis not present

## 2013-11-05 DIAGNOSIS — F411 Generalized anxiety disorder: Secondary | ICD-10-CM | POA: Diagnosis not present

## 2013-11-05 DIAGNOSIS — Z888 Allergy status to other drugs, medicaments and biological substances status: Secondary | ICD-10-CM | POA: Diagnosis not present

## 2013-11-05 DIAGNOSIS — Z7901 Long term (current) use of anticoagulants: Secondary | ICD-10-CM | POA: Diagnosis not present

## 2013-11-05 DIAGNOSIS — Z90710 Acquired absence of both cervix and uterus: Secondary | ICD-10-CM | POA: Diagnosis not present

## 2013-11-05 DIAGNOSIS — R52 Pain, unspecified: Secondary | ICD-10-CM | POA: Diagnosis not present

## 2013-11-05 DIAGNOSIS — C569 Malignant neoplasm of unspecified ovary: Secondary | ICD-10-CM | POA: Diagnosis not present

## 2013-11-05 DIAGNOSIS — F3289 Other specified depressive episodes: Secondary | ICD-10-CM | POA: Diagnosis not present

## 2013-11-05 DIAGNOSIS — Z5111 Encounter for antineoplastic chemotherapy: Secondary | ICD-10-CM | POA: Diagnosis not present

## 2013-11-05 DIAGNOSIS — Z87891 Personal history of nicotine dependence: Secondary | ICD-10-CM | POA: Diagnosis not present

## 2013-11-05 DIAGNOSIS — D3500 Benign neoplasm of unspecified adrenal gland: Secondary | ICD-10-CM | POA: Diagnosis not present

## 2013-11-05 DIAGNOSIS — I4891 Unspecified atrial fibrillation: Secondary | ICD-10-CM | POA: Diagnosis not present

## 2013-11-09 DIAGNOSIS — Z5111 Encounter for antineoplastic chemotherapy: Secondary | ICD-10-CM

## 2013-11-09 HISTORY — DX: Encounter for antineoplastic chemotherapy: Z51.11

## 2013-11-13 DIAGNOSIS — I10 Essential (primary) hypertension: Secondary | ICD-10-CM | POA: Diagnosis not present

## 2013-11-13 DIAGNOSIS — I714 Abdominal aortic aneurysm, without rupture, unspecified: Secondary | ICD-10-CM | POA: Diagnosis not present

## 2013-11-13 DIAGNOSIS — I4891 Unspecified atrial fibrillation: Secondary | ICD-10-CM | POA: Diagnosis not present

## 2013-11-13 DIAGNOSIS — C569 Malignant neoplasm of unspecified ovary: Secondary | ICD-10-CM | POA: Diagnosis not present

## 2013-11-26 DIAGNOSIS — C569 Malignant neoplasm of unspecified ovary: Secondary | ICD-10-CM | POA: Diagnosis not present

## 2013-11-26 DIAGNOSIS — Z5111 Encounter for antineoplastic chemotherapy: Secondary | ICD-10-CM | POA: Diagnosis not present

## 2013-12-09 DIAGNOSIS — G894 Chronic pain syndrome: Secondary | ICD-10-CM | POA: Diagnosis not present

## 2013-12-09 DIAGNOSIS — H6120 Impacted cerumen, unspecified ear: Secondary | ICD-10-CM | POA: Diagnosis not present

## 2013-12-09 DIAGNOSIS — R110 Nausea: Secondary | ICD-10-CM | POA: Diagnosis not present

## 2013-12-09 DIAGNOSIS — E785 Hyperlipidemia, unspecified: Secondary | ICD-10-CM | POA: Diagnosis not present

## 2013-12-09 DIAGNOSIS — J4489 Other specified chronic obstructive pulmonary disease: Secondary | ICD-10-CM | POA: Diagnosis not present

## 2013-12-09 DIAGNOSIS — J449 Chronic obstructive pulmonary disease, unspecified: Secondary | ICD-10-CM | POA: Diagnosis not present

## 2013-12-17 DIAGNOSIS — Z5111 Encounter for antineoplastic chemotherapy: Secondary | ICD-10-CM | POA: Diagnosis not present

## 2013-12-17 DIAGNOSIS — C569 Malignant neoplasm of unspecified ovary: Secondary | ICD-10-CM | POA: Diagnosis not present

## 2014-01-05 DIAGNOSIS — R928 Other abnormal and inconclusive findings on diagnostic imaging of breast: Secondary | ICD-10-CM | POA: Diagnosis not present

## 2014-01-05 DIAGNOSIS — R922 Inconclusive mammogram: Secondary | ICD-10-CM | POA: Diagnosis not present

## 2014-01-07 DIAGNOSIS — C569 Malignant neoplasm of unspecified ovary: Secondary | ICD-10-CM | POA: Diagnosis not present

## 2014-01-07 DIAGNOSIS — Z5111 Encounter for antineoplastic chemotherapy: Secondary | ICD-10-CM | POA: Diagnosis not present

## 2014-01-19 DIAGNOSIS — H6980 Other specified disorders of Eustachian tube, unspecified ear: Secondary | ICD-10-CM | POA: Diagnosis not present

## 2014-01-19 DIAGNOSIS — H6590 Unspecified nonsuppurative otitis media, unspecified ear: Secondary | ICD-10-CM | POA: Diagnosis not present

## 2014-01-19 DIAGNOSIS — Z8543 Personal history of malignant neoplasm of ovary: Secondary | ICD-10-CM | POA: Diagnosis not present

## 2014-01-19 DIAGNOSIS — H9190 Unspecified hearing loss, unspecified ear: Secondary | ICD-10-CM | POA: Diagnosis not present

## 2014-01-19 DIAGNOSIS — J342 Deviated nasal septum: Secondary | ICD-10-CM | POA: Diagnosis not present

## 2014-01-26 DIAGNOSIS — Z9079 Acquired absence of other genital organ(s): Secondary | ICD-10-CM | POA: Diagnosis not present

## 2014-01-26 DIAGNOSIS — C569 Malignant neoplasm of unspecified ovary: Secondary | ICD-10-CM | POA: Diagnosis not present

## 2014-01-26 DIAGNOSIS — Z90710 Acquired absence of both cervix and uterus: Secondary | ICD-10-CM | POA: Diagnosis not present

## 2014-02-02 DIAGNOSIS — H6590 Unspecified nonsuppurative otitis media, unspecified ear: Secondary | ICD-10-CM | POA: Diagnosis not present

## 2014-02-02 DIAGNOSIS — H6980 Other specified disorders of Eustachian tube, unspecified ear: Secondary | ICD-10-CM | POA: Diagnosis not present

## 2014-02-04 DIAGNOSIS — H6590 Unspecified nonsuppurative otitis media, unspecified ear: Secondary | ICD-10-CM | POA: Diagnosis not present

## 2014-02-05 DIAGNOSIS — C569 Malignant neoplasm of unspecified ovary: Secondary | ICD-10-CM | POA: Diagnosis not present

## 2014-02-05 DIAGNOSIS — Z0000 Encounter for general adult medical examination without abnormal findings: Secondary | ICD-10-CM | POA: Diagnosis not present

## 2014-02-05 DIAGNOSIS — H359 Unspecified retinal disorder: Secondary | ICD-10-CM | POA: Diagnosis not present

## 2014-02-05 DIAGNOSIS — K5900 Constipation, unspecified: Secondary | ICD-10-CM | POA: Diagnosis not present

## 2014-02-10 DIAGNOSIS — I10 Essential (primary) hypertension: Secondary | ICD-10-CM | POA: Diagnosis not present

## 2014-02-10 DIAGNOSIS — C569 Malignant neoplasm of unspecified ovary: Secondary | ICD-10-CM | POA: Diagnosis not present

## 2014-02-10 DIAGNOSIS — I4891 Unspecified atrial fibrillation: Secondary | ICD-10-CM | POA: Diagnosis not present

## 2014-02-26 DIAGNOSIS — H6690 Otitis media, unspecified, unspecified ear: Secondary | ICD-10-CM | POA: Diagnosis not present

## 2014-02-26 DIAGNOSIS — J342 Deviated nasal septum: Secondary | ICD-10-CM | POA: Diagnosis not present

## 2014-03-04 DIAGNOSIS — I959 Hypotension, unspecified: Secondary | ICD-10-CM | POA: Diagnosis not present

## 2014-03-04 DIAGNOSIS — R002 Palpitations: Secondary | ICD-10-CM | POA: Diagnosis not present

## 2014-03-04 DIAGNOSIS — R42 Dizziness and giddiness: Secondary | ICD-10-CM | POA: Diagnosis not present

## 2014-04-06 DIAGNOSIS — I714 Abdominal aortic aneurysm, without rupture: Secondary | ICD-10-CM | POA: Diagnosis not present

## 2014-04-09 DIAGNOSIS — F39 Unspecified mood [affective] disorder: Secondary | ICD-10-CM | POA: Diagnosis not present

## 2014-04-09 DIAGNOSIS — J019 Acute sinusitis, unspecified: Secondary | ICD-10-CM | POA: Diagnosis not present

## 2014-04-09 DIAGNOSIS — J440 Chronic obstructive pulmonary disease with acute lower respiratory infection: Secondary | ICD-10-CM | POA: Diagnosis not present

## 2014-04-09 DIAGNOSIS — C569 Malignant neoplasm of unspecified ovary: Secondary | ICD-10-CM | POA: Diagnosis not present

## 2014-04-09 DIAGNOSIS — Z6822 Body mass index (BMI) 22.0-22.9, adult: Secondary | ICD-10-CM | POA: Diagnosis not present

## 2014-04-09 DIAGNOSIS — N343 Urethral syndrome, unspecified: Secondary | ICD-10-CM | POA: Diagnosis not present

## 2014-04-13 DIAGNOSIS — I714 Abdominal aortic aneurysm, without rupture: Secondary | ICD-10-CM | POA: Diagnosis not present

## 2014-05-04 DIAGNOSIS — C562 Malignant neoplasm of left ovary: Secondary | ICD-10-CM | POA: Diagnosis not present

## 2014-05-04 DIAGNOSIS — Z7901 Long term (current) use of anticoagulants: Secondary | ICD-10-CM | POA: Diagnosis not present

## 2014-05-04 DIAGNOSIS — Z483 Aftercare following surgery for neoplasm: Secondary | ICD-10-CM | POA: Diagnosis not present

## 2014-05-04 DIAGNOSIS — Z9221 Personal history of antineoplastic chemotherapy: Secondary | ICD-10-CM | POA: Diagnosis not present

## 2014-06-10 DIAGNOSIS — H6521 Chronic serous otitis media, right ear: Secondary | ICD-10-CM | POA: Diagnosis not present

## 2014-06-10 DIAGNOSIS — H6981 Other specified disorders of Eustachian tube, right ear: Secondary | ICD-10-CM | POA: Diagnosis not present

## 2014-06-16 DIAGNOSIS — F39 Unspecified mood [affective] disorder: Secondary | ICD-10-CM | POA: Diagnosis not present

## 2014-06-16 DIAGNOSIS — Z681 Body mass index (BMI) 19 or less, adult: Secondary | ICD-10-CM | POA: Diagnosis not present

## 2014-06-16 DIAGNOSIS — C569 Malignant neoplasm of unspecified ovary: Secondary | ICD-10-CM | POA: Diagnosis not present

## 2014-06-16 DIAGNOSIS — I4891 Unspecified atrial fibrillation: Secondary | ICD-10-CM | POA: Diagnosis not present

## 2014-06-16 DIAGNOSIS — J449 Chronic obstructive pulmonary disease, unspecified: Secondary | ICD-10-CM | POA: Diagnosis not present

## 2014-06-16 DIAGNOSIS — J309 Allergic rhinitis, unspecified: Secondary | ICD-10-CM | POA: Diagnosis not present

## 2014-06-16 DIAGNOSIS — E785 Hyperlipidemia, unspecified: Secondary | ICD-10-CM | POA: Diagnosis not present

## 2014-07-01 DIAGNOSIS — S99912A Unspecified injury of left ankle, initial encounter: Secondary | ICD-10-CM | POA: Diagnosis not present

## 2014-07-01 DIAGNOSIS — M7989 Other specified soft tissue disorders: Secondary | ICD-10-CM | POA: Diagnosis not present

## 2014-07-01 DIAGNOSIS — M79672 Pain in left foot: Secondary | ICD-10-CM | POA: Diagnosis not present

## 2014-07-01 DIAGNOSIS — E780 Pure hypercholesterolemia: Secondary | ICD-10-CM | POA: Diagnosis not present

## 2014-07-01 DIAGNOSIS — M25572 Pain in left ankle and joints of left foot: Secondary | ICD-10-CM | POA: Diagnosis not present

## 2014-07-01 DIAGNOSIS — I10 Essential (primary) hypertension: Secondary | ICD-10-CM | POA: Diagnosis not present

## 2014-07-01 DIAGNOSIS — S99922A Unspecified injury of left foot, initial encounter: Secondary | ICD-10-CM | POA: Diagnosis not present

## 2014-07-01 DIAGNOSIS — S93402A Sprain of unspecified ligament of left ankle, initial encounter: Secondary | ICD-10-CM | POA: Diagnosis not present

## 2014-07-01 DIAGNOSIS — Z87891 Personal history of nicotine dependence: Secondary | ICD-10-CM | POA: Diagnosis not present

## 2014-07-01 DIAGNOSIS — W010XXA Fall on same level from slipping, tripping and stumbling without subsequent striking against object, initial encounter: Secondary | ICD-10-CM | POA: Diagnosis not present

## 2014-08-03 DIAGNOSIS — C562 Malignant neoplasm of left ovary: Secondary | ICD-10-CM | POA: Diagnosis not present

## 2014-09-11 DIAGNOSIS — H65499 Other chronic nonsuppurative otitis media, unspecified ear: Secondary | ICD-10-CM | POA: Diagnosis not present

## 2014-09-22 DIAGNOSIS — Z0000 Encounter for general adult medical examination without abnormal findings: Secondary | ICD-10-CM | POA: Diagnosis not present

## 2014-09-22 DIAGNOSIS — J449 Chronic obstructive pulmonary disease, unspecified: Secondary | ICD-10-CM | POA: Diagnosis not present

## 2014-09-22 DIAGNOSIS — J309 Allergic rhinitis, unspecified: Secondary | ICD-10-CM | POA: Diagnosis not present

## 2014-09-22 DIAGNOSIS — Z23 Encounter for immunization: Secondary | ICD-10-CM | POA: Diagnosis not present

## 2014-09-22 DIAGNOSIS — Z13820 Encounter for screening for osteoporosis: Secondary | ICD-10-CM | POA: Diagnosis not present

## 2014-09-22 DIAGNOSIS — F419 Anxiety disorder, unspecified: Secondary | ICD-10-CM | POA: Diagnosis not present

## 2014-09-22 DIAGNOSIS — I10 Essential (primary) hypertension: Secondary | ICD-10-CM | POA: Diagnosis not present

## 2014-09-22 DIAGNOSIS — K219 Gastro-esophageal reflux disease without esophagitis: Secondary | ICD-10-CM | POA: Diagnosis not present

## 2014-09-22 DIAGNOSIS — E559 Vitamin D deficiency, unspecified: Secondary | ICD-10-CM | POA: Diagnosis not present

## 2014-09-22 DIAGNOSIS — Z1212 Encounter for screening for malignant neoplasm of rectum: Secondary | ICD-10-CM | POA: Diagnosis not present

## 2014-09-22 DIAGNOSIS — E785 Hyperlipidemia, unspecified: Secondary | ICD-10-CM | POA: Diagnosis not present

## 2014-09-22 DIAGNOSIS — Z79899 Other long term (current) drug therapy: Secondary | ICD-10-CM | POA: Diagnosis not present

## 2014-09-22 DIAGNOSIS — R928 Other abnormal and inconclusive findings on diagnostic imaging of breast: Secondary | ICD-10-CM | POA: Diagnosis not present

## 2014-11-11 DIAGNOSIS — C569 Malignant neoplasm of unspecified ovary: Secondary | ICD-10-CM | POA: Diagnosis not present

## 2014-11-11 DIAGNOSIS — Z79899 Other long term (current) drug therapy: Secondary | ICD-10-CM | POA: Diagnosis not present

## 2014-11-16 DIAGNOSIS — I714 Abdominal aortic aneurysm, without rupture: Secondary | ICD-10-CM | POA: Diagnosis not present

## 2014-11-16 DIAGNOSIS — I70202 Unspecified atherosclerosis of native arteries of extremities, left leg: Secondary | ICD-10-CM | POA: Diagnosis not present

## 2014-12-17 DIAGNOSIS — I714 Abdominal aortic aneurysm, without rupture: Secondary | ICD-10-CM | POA: Diagnosis not present

## 2014-12-17 DIAGNOSIS — M79605 Pain in left leg: Secondary | ICD-10-CM | POA: Diagnosis not present

## 2014-12-17 DIAGNOSIS — M79604 Pain in right leg: Secondary | ICD-10-CM | POA: Diagnosis not present

## 2014-12-23 DIAGNOSIS — M25571 Pain in right ankle and joints of right foot: Secondary | ICD-10-CM | POA: Diagnosis not present

## 2014-12-23 DIAGNOSIS — S93401A Sprain of unspecified ligament of right ankle, initial encounter: Secondary | ICD-10-CM | POA: Diagnosis not present

## 2014-12-23 DIAGNOSIS — M7989 Other specified soft tissue disorders: Secondary | ICD-10-CM | POA: Diagnosis not present

## 2014-12-23 DIAGNOSIS — S99911A Unspecified injury of right ankle, initial encounter: Secondary | ICD-10-CM | POA: Diagnosis not present

## 2014-12-23 DIAGNOSIS — S99921A Unspecified injury of right foot, initial encounter: Secondary | ICD-10-CM | POA: Diagnosis not present

## 2014-12-23 DIAGNOSIS — S93601A Unspecified sprain of right foot, initial encounter: Secondary | ICD-10-CM | POA: Diagnosis not present

## 2014-12-23 DIAGNOSIS — M79671 Pain in right foot: Secondary | ICD-10-CM | POA: Diagnosis not present

## 2014-12-28 DIAGNOSIS — T161XXA Foreign body in right ear, initial encounter: Secondary | ICD-10-CM | POA: Diagnosis not present

## 2015-01-05 DIAGNOSIS — J449 Chronic obstructive pulmonary disease, unspecified: Secondary | ICD-10-CM | POA: Diagnosis not present

## 2015-01-05 DIAGNOSIS — E559 Vitamin D deficiency, unspecified: Secondary | ICD-10-CM | POA: Diagnosis not present

## 2015-01-05 DIAGNOSIS — Z09 Encounter for follow-up examination after completed treatment for conditions other than malignant neoplasm: Secondary | ICD-10-CM | POA: Diagnosis not present

## 2015-01-05 DIAGNOSIS — F39 Unspecified mood [affective] disorder: Secondary | ICD-10-CM | POA: Diagnosis not present

## 2015-01-05 DIAGNOSIS — Z780 Asymptomatic menopausal state: Secondary | ICD-10-CM | POA: Diagnosis not present

## 2015-01-05 DIAGNOSIS — I4891 Unspecified atrial fibrillation: Secondary | ICD-10-CM | POA: Diagnosis not present

## 2015-01-05 DIAGNOSIS — I10 Essential (primary) hypertension: Secondary | ICD-10-CM | POA: Diagnosis not present

## 2015-01-05 DIAGNOSIS — E785 Hyperlipidemia, unspecified: Secondary | ICD-10-CM | POA: Diagnosis not present

## 2015-01-05 DIAGNOSIS — Z6824 Body mass index (BMI) 24.0-24.9, adult: Secondary | ICD-10-CM | POA: Diagnosis not present

## 2015-01-05 DIAGNOSIS — S93401A Sprain of unspecified ligament of right ankle, initial encounter: Secondary | ICD-10-CM | POA: Diagnosis not present

## 2015-01-12 DIAGNOSIS — C569 Malignant neoplasm of unspecified ovary: Secondary | ICD-10-CM | POA: Diagnosis not present

## 2015-01-12 DIAGNOSIS — I480 Paroxysmal atrial fibrillation: Secondary | ICD-10-CM | POA: Diagnosis not present

## 2015-01-12 DIAGNOSIS — E785 Hyperlipidemia, unspecified: Secondary | ICD-10-CM | POA: Diagnosis not present

## 2015-01-12 DIAGNOSIS — I10 Essential (primary) hypertension: Secondary | ICD-10-CM | POA: Diagnosis not present

## 2015-01-19 DIAGNOSIS — Z8543 Personal history of malignant neoplasm of ovary: Secondary | ICD-10-CM | POA: Diagnosis not present

## 2015-01-19 DIAGNOSIS — R062 Wheezing: Secondary | ICD-10-CM | POA: Diagnosis not present

## 2015-01-19 DIAGNOSIS — Z79899 Other long term (current) drug therapy: Secondary | ICD-10-CM | POA: Diagnosis not present

## 2015-01-19 DIAGNOSIS — R0600 Dyspnea, unspecified: Secondary | ICD-10-CM | POA: Diagnosis not present

## 2015-01-19 DIAGNOSIS — Z87891 Personal history of nicotine dependence: Secondary | ICD-10-CM | POA: Diagnosis not present

## 2015-02-17 DIAGNOSIS — C569 Malignant neoplasm of unspecified ovary: Secondary | ICD-10-CM | POA: Diagnosis not present

## 2015-04-07 DIAGNOSIS — Z9181 History of falling: Secondary | ICD-10-CM | POA: Diagnosis not present

## 2015-04-07 DIAGNOSIS — F39 Unspecified mood [affective] disorder: Secondary | ICD-10-CM | POA: Diagnosis not present

## 2015-04-07 DIAGNOSIS — E785 Hyperlipidemia, unspecified: Secondary | ICD-10-CM | POA: Diagnosis not present

## 2015-04-07 DIAGNOSIS — I4891 Unspecified atrial fibrillation: Secondary | ICD-10-CM | POA: Diagnosis not present

## 2015-04-07 DIAGNOSIS — E559 Vitamin D deficiency, unspecified: Secondary | ICD-10-CM | POA: Diagnosis not present

## 2015-04-07 DIAGNOSIS — I10 Essential (primary) hypertension: Secondary | ICD-10-CM | POA: Diagnosis not present

## 2015-04-07 DIAGNOSIS — J449 Chronic obstructive pulmonary disease, unspecified: Secondary | ICD-10-CM | POA: Diagnosis not present

## 2015-04-07 DIAGNOSIS — C569 Malignant neoplasm of unspecified ovary: Secondary | ICD-10-CM | POA: Diagnosis not present

## 2015-04-07 DIAGNOSIS — Z6824 Body mass index (BMI) 24.0-24.9, adult: Secondary | ICD-10-CM | POA: Diagnosis not present

## 2015-04-07 DIAGNOSIS — Z79899 Other long term (current) drug therapy: Secondary | ICD-10-CM | POA: Diagnosis not present

## 2015-05-18 DIAGNOSIS — I70203 Unspecified atherosclerosis of native arteries of extremities, bilateral legs: Secondary | ICD-10-CM | POA: Diagnosis not present

## 2015-05-18 DIAGNOSIS — I714 Abdominal aortic aneurysm, without rupture: Secondary | ICD-10-CM | POA: Diagnosis not present

## 2015-05-26 DIAGNOSIS — C562 Malignant neoplasm of left ovary: Secondary | ICD-10-CM | POA: Diagnosis not present

## 2015-05-26 DIAGNOSIS — Z8543 Personal history of malignant neoplasm of ovary: Secondary | ICD-10-CM | POA: Diagnosis not present

## 2015-05-26 DIAGNOSIS — Z483 Aftercare following surgery for neoplasm: Secondary | ICD-10-CM | POA: Diagnosis not present

## 2015-06-03 DIAGNOSIS — R0989 Other specified symptoms and signs involving the circulatory and respiratory systems: Secondary | ICD-10-CM | POA: Diagnosis not present

## 2015-06-03 DIAGNOSIS — I714 Abdominal aortic aneurysm, without rupture: Secondary | ICD-10-CM | POA: Diagnosis not present

## 2015-06-03 DIAGNOSIS — I70203 Unspecified atherosclerosis of native arteries of extremities, bilateral legs: Secondary | ICD-10-CM | POA: Diagnosis not present

## 2015-06-03 DIAGNOSIS — I10 Essential (primary) hypertension: Secondary | ICD-10-CM | POA: Diagnosis not present

## 2015-06-03 DIAGNOSIS — Z7901 Long term (current) use of anticoagulants: Secondary | ICD-10-CM | POA: Diagnosis not present

## 2015-06-03 DIAGNOSIS — I4891 Unspecified atrial fibrillation: Secondary | ICD-10-CM | POA: Diagnosis not present

## 2015-07-21 DIAGNOSIS — I10 Essential (primary) hypertension: Secondary | ICD-10-CM | POA: Diagnosis not present

## 2015-07-21 DIAGNOSIS — I480 Paroxysmal atrial fibrillation: Secondary | ICD-10-CM | POA: Diagnosis not present

## 2015-07-21 DIAGNOSIS — E785 Hyperlipidemia, unspecified: Secondary | ICD-10-CM | POA: Diagnosis not present

## 2015-08-05 DIAGNOSIS — K219 Gastro-esophageal reflux disease without esophagitis: Secondary | ICD-10-CM | POA: Diagnosis not present

## 2015-08-05 DIAGNOSIS — Z6824 Body mass index (BMI) 24.0-24.9, adult: Secondary | ICD-10-CM | POA: Diagnosis not present

## 2015-08-05 DIAGNOSIS — E559 Vitamin D deficiency, unspecified: Secondary | ICD-10-CM | POA: Diagnosis not present

## 2015-08-05 DIAGNOSIS — I10 Essential (primary) hypertension: Secondary | ICD-10-CM | POA: Diagnosis not present

## 2015-08-05 DIAGNOSIS — E785 Hyperlipidemia, unspecified: Secondary | ICD-10-CM | POA: Diagnosis not present

## 2015-08-05 DIAGNOSIS — Z1389 Encounter for screening for other disorder: Secondary | ICD-10-CM | POA: Diagnosis not present

## 2015-08-05 DIAGNOSIS — K5909 Other constipation: Secondary | ICD-10-CM | POA: Diagnosis not present

## 2015-08-05 DIAGNOSIS — J309 Allergic rhinitis, unspecified: Secondary | ICD-10-CM | POA: Diagnosis not present

## 2015-08-05 DIAGNOSIS — C569 Malignant neoplasm of unspecified ovary: Secondary | ICD-10-CM | POA: Diagnosis not present

## 2015-08-05 DIAGNOSIS — I4891 Unspecified atrial fibrillation: Secondary | ICD-10-CM | POA: Diagnosis not present

## 2015-08-25 DIAGNOSIS — Z08 Encounter for follow-up examination after completed treatment for malignant neoplasm: Secondary | ICD-10-CM | POA: Diagnosis not present

## 2015-08-25 DIAGNOSIS — Z8543 Personal history of malignant neoplasm of ovary: Secondary | ICD-10-CM | POA: Diagnosis not present

## 2015-08-25 DIAGNOSIS — C562 Malignant neoplasm of left ovary: Secondary | ICD-10-CM | POA: Diagnosis not present

## 2015-10-26 DIAGNOSIS — Z79899 Other long term (current) drug therapy: Secondary | ICD-10-CM | POA: Diagnosis not present

## 2015-10-26 DIAGNOSIS — J309 Allergic rhinitis, unspecified: Secondary | ICD-10-CM | POA: Diagnosis not present

## 2015-10-26 DIAGNOSIS — J441 Chronic obstructive pulmonary disease with (acute) exacerbation: Secondary | ICD-10-CM | POA: Diagnosis not present

## 2015-10-26 DIAGNOSIS — Z6824 Body mass index (BMI) 24.0-24.9, adult: Secondary | ICD-10-CM | POA: Diagnosis not present

## 2015-11-30 DIAGNOSIS — C562 Malignant neoplasm of left ovary: Secondary | ICD-10-CM | POA: Diagnosis not present

## 2015-11-30 DIAGNOSIS — Z08 Encounter for follow-up examination after completed treatment for malignant neoplasm: Secondary | ICD-10-CM | POA: Diagnosis not present

## 2015-11-30 DIAGNOSIS — Z8543 Personal history of malignant neoplasm of ovary: Secondary | ICD-10-CM | POA: Diagnosis not present

## 2015-12-14 DIAGNOSIS — M7989 Other specified soft tissue disorders: Secondary | ICD-10-CM | POA: Diagnosis not present

## 2015-12-14 DIAGNOSIS — M79632 Pain in left forearm: Secondary | ICD-10-CM | POA: Diagnosis not present

## 2015-12-14 DIAGNOSIS — R040 Epistaxis: Secondary | ICD-10-CM | POA: Diagnosis not present

## 2015-12-14 DIAGNOSIS — S6992XA Unspecified injury of left wrist, hand and finger(s), initial encounter: Secondary | ICD-10-CM | POA: Diagnosis not present

## 2015-12-14 DIAGNOSIS — Z6824 Body mass index (BMI) 24.0-24.9, adult: Secondary | ICD-10-CM | POA: Diagnosis not present

## 2015-12-14 DIAGNOSIS — M79642 Pain in left hand: Secondary | ICD-10-CM | POA: Diagnosis not present

## 2015-12-14 DIAGNOSIS — I10 Essential (primary) hypertension: Secondary | ICD-10-CM | POA: Diagnosis not present

## 2015-12-14 DIAGNOSIS — S0083XA Contusion of other part of head, initial encounter: Secondary | ICD-10-CM | POA: Diagnosis not present

## 2015-12-14 DIAGNOSIS — S52602A Unspecified fracture of lower end of left ulna, initial encounter for closed fracture: Secondary | ICD-10-CM | POA: Diagnosis not present

## 2015-12-15 DIAGNOSIS — S52202A Unspecified fracture of shaft of left ulna, initial encounter for closed fracture: Secondary | ICD-10-CM | POA: Diagnosis not present

## 2015-12-23 DIAGNOSIS — R0989 Other specified symptoms and signs involving the circulatory and respiratory systems: Secondary | ICD-10-CM | POA: Diagnosis not present

## 2015-12-23 DIAGNOSIS — I714 Abdominal aortic aneurysm, without rupture: Secondary | ICD-10-CM | POA: Diagnosis not present

## 2015-12-23 DIAGNOSIS — I700 Atherosclerosis of aorta: Secondary | ICD-10-CM | POA: Diagnosis not present

## 2015-12-23 DIAGNOSIS — I70203 Unspecified atherosclerosis of native arteries of extremities, bilateral legs: Secondary | ICD-10-CM | POA: Diagnosis not present

## 2015-12-23 DIAGNOSIS — I6523 Occlusion and stenosis of bilateral carotid arteries: Secondary | ICD-10-CM | POA: Diagnosis not present

## 2016-01-06 DIAGNOSIS — Z79899 Other long term (current) drug therapy: Secondary | ICD-10-CM | POA: Diagnosis not present

## 2016-01-06 DIAGNOSIS — I714 Abdominal aortic aneurysm, without rupture, unspecified: Secondary | ICD-10-CM | POA: Insufficient documentation

## 2016-01-06 DIAGNOSIS — I729 Aneurysm of unspecified site: Secondary | ICD-10-CM | POA: Diagnosis not present

## 2016-01-06 DIAGNOSIS — I6523 Occlusion and stenosis of bilateral carotid arteries: Secondary | ICD-10-CM

## 2016-01-06 DIAGNOSIS — R001 Bradycardia, unspecified: Secondary | ICD-10-CM | POA: Diagnosis not present

## 2016-01-06 DIAGNOSIS — I10 Essential (primary) hypertension: Secondary | ICD-10-CM | POA: Diagnosis not present

## 2016-01-06 DIAGNOSIS — Z780 Asymptomatic menopausal state: Secondary | ICD-10-CM | POA: Diagnosis not present

## 2016-01-06 DIAGNOSIS — J449 Chronic obstructive pulmonary disease, unspecified: Secondary | ICD-10-CM | POA: Diagnosis not present

## 2016-01-06 DIAGNOSIS — Z8249 Family history of ischemic heart disease and other diseases of the circulatory system: Secondary | ICD-10-CM | POA: Diagnosis not present

## 2016-01-06 DIAGNOSIS — I4891 Unspecified atrial fibrillation: Secondary | ICD-10-CM | POA: Diagnosis not present

## 2016-01-06 DIAGNOSIS — Z7951 Long term (current) use of inhaled steroids: Secondary | ICD-10-CM | POA: Diagnosis not present

## 2016-01-06 DIAGNOSIS — Z87891 Personal history of nicotine dependence: Secondary | ICD-10-CM | POA: Diagnosis not present

## 2016-01-06 DIAGNOSIS — I739 Peripheral vascular disease, unspecified: Secondary | ICD-10-CM | POA: Diagnosis not present

## 2016-01-06 DIAGNOSIS — R072 Precordial pain: Secondary | ICD-10-CM | POA: Diagnosis not present

## 2016-01-06 DIAGNOSIS — E785 Hyperlipidemia, unspecified: Secondary | ICD-10-CM | POA: Diagnosis not present

## 2016-01-06 DIAGNOSIS — R079 Chest pain, unspecified: Secondary | ICD-10-CM | POA: Diagnosis not present

## 2016-01-06 HISTORY — DX: Abdominal aortic aneurysm, without rupture: I71.4

## 2016-01-06 HISTORY — DX: Abdominal aortic aneurysm, without rupture, unspecified: I71.40

## 2016-01-06 HISTORY — DX: Occlusion and stenosis of bilateral carotid arteries: I65.23

## 2016-01-07 DIAGNOSIS — R072 Precordial pain: Secondary | ICD-10-CM | POA: Diagnosis not present

## 2016-01-07 DIAGNOSIS — I10 Essential (primary) hypertension: Secondary | ICD-10-CM | POA: Diagnosis not present

## 2016-01-07 DIAGNOSIS — R0789 Other chest pain: Secondary | ICD-10-CM | POA: Diagnosis not present

## 2016-01-12 DIAGNOSIS — E785 Hyperlipidemia, unspecified: Secondary | ICD-10-CM | POA: Diagnosis not present

## 2016-01-12 DIAGNOSIS — E559 Vitamin D deficiency, unspecified: Secondary | ICD-10-CM | POA: Diagnosis not present

## 2016-01-12 DIAGNOSIS — I10 Essential (primary) hypertension: Secondary | ICD-10-CM | POA: Diagnosis not present

## 2016-01-12 DIAGNOSIS — Z79899 Other long term (current) drug therapy: Secondary | ICD-10-CM | POA: Diagnosis not present

## 2016-01-12 DIAGNOSIS — Z09 Encounter for follow-up examination after completed treatment for conditions other than malignant neoplasm: Secondary | ICD-10-CM | POA: Diagnosis not present

## 2016-01-12 DIAGNOSIS — I714 Abdominal aortic aneurysm, without rupture: Secondary | ICD-10-CM | POA: Diagnosis not present

## 2016-01-12 DIAGNOSIS — Z6824 Body mass index (BMI) 24.0-24.9, adult: Secondary | ICD-10-CM | POA: Diagnosis not present

## 2016-01-12 DIAGNOSIS — S52202A Unspecified fracture of shaft of left ulna, initial encounter for closed fracture: Secondary | ICD-10-CM | POA: Diagnosis not present

## 2016-01-12 DIAGNOSIS — I4891 Unspecified atrial fibrillation: Secondary | ICD-10-CM | POA: Diagnosis not present

## 2016-01-12 DIAGNOSIS — J449 Chronic obstructive pulmonary disease, unspecified: Secondary | ICD-10-CM | POA: Diagnosis not present

## 2016-02-01 DIAGNOSIS — S52202A Unspecified fracture of shaft of left ulna, initial encounter for closed fracture: Secondary | ICD-10-CM | POA: Diagnosis not present

## 2016-02-16 DIAGNOSIS — Z0000 Encounter for general adult medical examination without abnormal findings: Secondary | ICD-10-CM | POA: Diagnosis not present

## 2016-02-16 DIAGNOSIS — J309 Allergic rhinitis, unspecified: Secondary | ICD-10-CM | POA: Diagnosis not present

## 2016-02-16 DIAGNOSIS — F39 Unspecified mood [affective] disorder: Secondary | ICD-10-CM | POA: Diagnosis not present

## 2016-02-16 DIAGNOSIS — J449 Chronic obstructive pulmonary disease, unspecified: Secondary | ICD-10-CM | POA: Diagnosis not present

## 2016-02-16 DIAGNOSIS — R928 Other abnormal and inconclusive findings on diagnostic imaging of breast: Secondary | ICD-10-CM | POA: Diagnosis not present

## 2016-02-16 DIAGNOSIS — E559 Vitamin D deficiency, unspecified: Secondary | ICD-10-CM | POA: Diagnosis not present

## 2016-02-16 DIAGNOSIS — I10 Essential (primary) hypertension: Secondary | ICD-10-CM | POA: Diagnosis not present

## 2016-02-16 DIAGNOSIS — I4891 Unspecified atrial fibrillation: Secondary | ICD-10-CM | POA: Diagnosis not present

## 2016-02-16 DIAGNOSIS — E785 Hyperlipidemia, unspecified: Secondary | ICD-10-CM | POA: Diagnosis not present

## 2016-02-16 DIAGNOSIS — Z6824 Body mass index (BMI) 24.0-24.9, adult: Secondary | ICD-10-CM | POA: Diagnosis not present

## 2016-02-17 DIAGNOSIS — E785 Hyperlipidemia, unspecified: Secondary | ICD-10-CM | POA: Diagnosis not present

## 2016-02-17 DIAGNOSIS — I480 Paroxysmal atrial fibrillation: Secondary | ICD-10-CM | POA: Diagnosis not present

## 2016-02-17 DIAGNOSIS — I10 Essential (primary) hypertension: Secondary | ICD-10-CM | POA: Diagnosis not present

## 2016-02-22 DIAGNOSIS — S52202A Unspecified fracture of shaft of left ulna, initial encounter for closed fracture: Secondary | ICD-10-CM | POA: Diagnosis not present

## 2016-03-01 DIAGNOSIS — I714 Abdominal aortic aneurysm, without rupture: Secondary | ICD-10-CM | POA: Diagnosis not present

## 2016-03-01 DIAGNOSIS — Z08 Encounter for follow-up examination after completed treatment for malignant neoplasm: Secondary | ICD-10-CM | POA: Diagnosis not present

## 2016-03-01 DIAGNOSIS — C562 Malignant neoplasm of left ovary: Secondary | ICD-10-CM | POA: Diagnosis not present

## 2016-03-01 DIAGNOSIS — Z90722 Acquired absence of ovaries, bilateral: Secondary | ICD-10-CM | POA: Diagnosis not present

## 2016-03-01 DIAGNOSIS — Z8543 Personal history of malignant neoplasm of ovary: Secondary | ICD-10-CM | POA: Diagnosis not present

## 2016-03-01 DIAGNOSIS — Z90710 Acquired absence of both cervix and uterus: Secondary | ICD-10-CM | POA: Diagnosis not present

## 2016-04-11 DIAGNOSIS — R921 Mammographic calcification found on diagnostic imaging of breast: Secondary | ICD-10-CM | POA: Diagnosis not present

## 2016-04-17 DIAGNOSIS — D241 Benign neoplasm of right breast: Secondary | ICD-10-CM | POA: Diagnosis not present

## 2016-04-17 DIAGNOSIS — N642 Atrophy of breast: Secondary | ICD-10-CM | POA: Diagnosis not present

## 2016-04-17 DIAGNOSIS — R921 Mammographic calcification found on diagnostic imaging of breast: Secondary | ICD-10-CM | POA: Diagnosis not present

## 2016-04-19 DIAGNOSIS — I714 Abdominal aortic aneurysm, without rupture: Secondary | ICD-10-CM | POA: Diagnosis not present

## 2016-05-02 DIAGNOSIS — I714 Abdominal aortic aneurysm, without rupture: Secondary | ICD-10-CM | POA: Diagnosis not present

## 2016-05-02 DIAGNOSIS — I4891 Unspecified atrial fibrillation: Secondary | ICD-10-CM | POA: Diagnosis not present

## 2016-05-02 DIAGNOSIS — I6523 Occlusion and stenosis of bilateral carotid arteries: Secondary | ICD-10-CM | POA: Diagnosis not present

## 2016-05-02 DIAGNOSIS — E785 Hyperlipidemia, unspecified: Secondary | ICD-10-CM | POA: Diagnosis not present

## 2016-05-02 DIAGNOSIS — I10 Essential (primary) hypertension: Secondary | ICD-10-CM | POA: Diagnosis not present

## 2016-06-19 DIAGNOSIS — E785 Hyperlipidemia, unspecified: Secondary | ICD-10-CM | POA: Diagnosis not present

## 2016-06-19 DIAGNOSIS — Z6824 Body mass index (BMI) 24.0-24.9, adult: Secondary | ICD-10-CM | POA: Diagnosis not present

## 2016-06-19 DIAGNOSIS — K219 Gastro-esophageal reflux disease without esophagitis: Secondary | ICD-10-CM | POA: Diagnosis not present

## 2016-06-19 DIAGNOSIS — E559 Vitamin D deficiency, unspecified: Secondary | ICD-10-CM | POA: Diagnosis not present

## 2016-06-19 DIAGNOSIS — W19XXXA Unspecified fall, initial encounter: Secondary | ICD-10-CM | POA: Diagnosis not present

## 2016-06-19 DIAGNOSIS — I10 Essential (primary) hypertension: Secondary | ICD-10-CM | POA: Diagnosis not present

## 2016-06-23 DIAGNOSIS — Z87891 Personal history of nicotine dependence: Secondary | ICD-10-CM | POA: Diagnosis not present

## 2016-06-23 DIAGNOSIS — R2689 Other abnormalities of gait and mobility: Secondary | ICD-10-CM | POA: Diagnosis not present

## 2016-06-23 DIAGNOSIS — Z7901 Long term (current) use of anticoagulants: Secondary | ICD-10-CM | POA: Diagnosis not present

## 2016-06-23 DIAGNOSIS — Z9181 History of falling: Secondary | ICD-10-CM | POA: Diagnosis not present

## 2016-06-23 DIAGNOSIS — I10 Essential (primary) hypertension: Secondary | ICD-10-CM | POA: Diagnosis not present

## 2016-06-23 DIAGNOSIS — R2681 Unsteadiness on feet: Secondary | ICD-10-CM | POA: Diagnosis not present

## 2016-06-23 DIAGNOSIS — J449 Chronic obstructive pulmonary disease, unspecified: Secondary | ICD-10-CM | POA: Diagnosis not present

## 2016-06-23 DIAGNOSIS — Z8543 Personal history of malignant neoplasm of ovary: Secondary | ICD-10-CM | POA: Diagnosis not present

## 2016-06-23 DIAGNOSIS — W19XXXD Unspecified fall, subsequent encounter: Secondary | ICD-10-CM | POA: Diagnosis not present

## 2016-06-27 DIAGNOSIS — Z7901 Long term (current) use of anticoagulants: Secondary | ICD-10-CM | POA: Diagnosis not present

## 2016-06-27 DIAGNOSIS — R2681 Unsteadiness on feet: Secondary | ICD-10-CM | POA: Diagnosis not present

## 2016-06-27 DIAGNOSIS — R2689 Other abnormalities of gait and mobility: Secondary | ICD-10-CM | POA: Diagnosis not present

## 2016-06-27 DIAGNOSIS — J449 Chronic obstructive pulmonary disease, unspecified: Secondary | ICD-10-CM | POA: Diagnosis not present

## 2016-06-27 DIAGNOSIS — W19XXXD Unspecified fall, subsequent encounter: Secondary | ICD-10-CM | POA: Diagnosis not present

## 2016-06-27 DIAGNOSIS — I10 Essential (primary) hypertension: Secondary | ICD-10-CM | POA: Diagnosis not present

## 2016-06-29 DIAGNOSIS — I10 Essential (primary) hypertension: Secondary | ICD-10-CM | POA: Diagnosis not present

## 2016-06-29 DIAGNOSIS — R2681 Unsteadiness on feet: Secondary | ICD-10-CM | POA: Diagnosis not present

## 2016-06-29 DIAGNOSIS — R2689 Other abnormalities of gait and mobility: Secondary | ICD-10-CM | POA: Diagnosis not present

## 2016-06-29 DIAGNOSIS — W19XXXD Unspecified fall, subsequent encounter: Secondary | ICD-10-CM | POA: Diagnosis not present

## 2016-06-29 DIAGNOSIS — Z7901 Long term (current) use of anticoagulants: Secondary | ICD-10-CM | POA: Diagnosis not present

## 2016-06-29 DIAGNOSIS — J449 Chronic obstructive pulmonary disease, unspecified: Secondary | ICD-10-CM | POA: Diagnosis not present

## 2016-07-04 DIAGNOSIS — W19XXXD Unspecified fall, subsequent encounter: Secondary | ICD-10-CM | POA: Diagnosis not present

## 2016-07-04 DIAGNOSIS — I10 Essential (primary) hypertension: Secondary | ICD-10-CM | POA: Diagnosis not present

## 2016-07-04 DIAGNOSIS — R2681 Unsteadiness on feet: Secondary | ICD-10-CM | POA: Diagnosis not present

## 2016-07-04 DIAGNOSIS — R2689 Other abnormalities of gait and mobility: Secondary | ICD-10-CM | POA: Diagnosis not present

## 2016-07-04 DIAGNOSIS — Z7901 Long term (current) use of anticoagulants: Secondary | ICD-10-CM | POA: Diagnosis not present

## 2016-07-04 DIAGNOSIS — J449 Chronic obstructive pulmonary disease, unspecified: Secondary | ICD-10-CM | POA: Diagnosis not present

## 2016-07-06 DIAGNOSIS — R2689 Other abnormalities of gait and mobility: Secondary | ICD-10-CM | POA: Diagnosis not present

## 2016-07-06 DIAGNOSIS — J449 Chronic obstructive pulmonary disease, unspecified: Secondary | ICD-10-CM | POA: Diagnosis not present

## 2016-07-06 DIAGNOSIS — Z7901 Long term (current) use of anticoagulants: Secondary | ICD-10-CM | POA: Diagnosis not present

## 2016-07-06 DIAGNOSIS — R2681 Unsteadiness on feet: Secondary | ICD-10-CM | POA: Diagnosis not present

## 2016-07-06 DIAGNOSIS — I10 Essential (primary) hypertension: Secondary | ICD-10-CM | POA: Diagnosis not present

## 2016-07-06 DIAGNOSIS — W19XXXD Unspecified fall, subsequent encounter: Secondary | ICD-10-CM | POA: Diagnosis not present

## 2016-07-10 DIAGNOSIS — I10 Essential (primary) hypertension: Secondary | ICD-10-CM | POA: Diagnosis not present

## 2016-07-10 DIAGNOSIS — Z7901 Long term (current) use of anticoagulants: Secondary | ICD-10-CM | POA: Diagnosis not present

## 2016-07-10 DIAGNOSIS — R2689 Other abnormalities of gait and mobility: Secondary | ICD-10-CM | POA: Diagnosis not present

## 2016-07-10 DIAGNOSIS — J449 Chronic obstructive pulmonary disease, unspecified: Secondary | ICD-10-CM | POA: Diagnosis not present

## 2016-07-10 DIAGNOSIS — W19XXXD Unspecified fall, subsequent encounter: Secondary | ICD-10-CM | POA: Diagnosis not present

## 2016-07-10 DIAGNOSIS — R2681 Unsteadiness on feet: Secondary | ICD-10-CM | POA: Diagnosis not present

## 2016-07-14 DIAGNOSIS — R2681 Unsteadiness on feet: Secondary | ICD-10-CM | POA: Diagnosis not present

## 2016-07-14 DIAGNOSIS — Z7901 Long term (current) use of anticoagulants: Secondary | ICD-10-CM | POA: Diagnosis not present

## 2016-07-14 DIAGNOSIS — J449 Chronic obstructive pulmonary disease, unspecified: Secondary | ICD-10-CM | POA: Diagnosis not present

## 2016-07-14 DIAGNOSIS — W19XXXD Unspecified fall, subsequent encounter: Secondary | ICD-10-CM | POA: Diagnosis not present

## 2016-07-14 DIAGNOSIS — I10 Essential (primary) hypertension: Secondary | ICD-10-CM | POA: Diagnosis not present

## 2016-07-14 DIAGNOSIS — R2689 Other abnormalities of gait and mobility: Secondary | ICD-10-CM | POA: Diagnosis not present

## 2016-08-02 DIAGNOSIS — I70213 Atherosclerosis of native arteries of extremities with intermittent claudication, bilateral legs: Secondary | ICD-10-CM | POA: Diagnosis not present

## 2016-08-02 DIAGNOSIS — I6523 Occlusion and stenosis of bilateral carotid arteries: Secondary | ICD-10-CM | POA: Diagnosis not present

## 2016-08-02 DIAGNOSIS — I10 Essential (primary) hypertension: Secondary | ICD-10-CM | POA: Diagnosis not present

## 2016-08-02 DIAGNOSIS — I714 Abdominal aortic aneurysm, without rupture: Secondary | ICD-10-CM | POA: Diagnosis not present

## 2016-08-02 DIAGNOSIS — I4891 Unspecified atrial fibrillation: Secondary | ICD-10-CM | POA: Diagnosis not present

## 2016-08-02 DIAGNOSIS — I70203 Unspecified atherosclerosis of native arteries of extremities, bilateral legs: Secondary | ICD-10-CM | POA: Diagnosis not present

## 2016-08-17 DIAGNOSIS — E785 Hyperlipidemia, unspecified: Secondary | ICD-10-CM | POA: Diagnosis not present

## 2016-08-17 DIAGNOSIS — I480 Paroxysmal atrial fibrillation: Secondary | ICD-10-CM | POA: Diagnosis not present

## 2016-08-17 DIAGNOSIS — I10 Essential (primary) hypertension: Secondary | ICD-10-CM | POA: Diagnosis not present

## 2016-08-23 DIAGNOSIS — I4891 Unspecified atrial fibrillation: Secondary | ICD-10-CM | POA: Diagnosis not present

## 2016-08-23 DIAGNOSIS — J440 Chronic obstructive pulmonary disease with acute lower respiratory infection: Secondary | ICD-10-CM | POA: Diagnosis not present

## 2016-08-23 DIAGNOSIS — J019 Acute sinusitis, unspecified: Secondary | ICD-10-CM | POA: Diagnosis not present

## 2016-08-23 DIAGNOSIS — Z6825 Body mass index (BMI) 25.0-25.9, adult: Secondary | ICD-10-CM | POA: Diagnosis not present

## 2016-08-30 DIAGNOSIS — Z8543 Personal history of malignant neoplasm of ovary: Secondary | ICD-10-CM | POA: Diagnosis not present

## 2016-08-30 DIAGNOSIS — Z08 Encounter for follow-up examination after completed treatment for malignant neoplasm: Secondary | ICD-10-CM | POA: Diagnosis not present

## 2016-08-30 DIAGNOSIS — Z9221 Personal history of antineoplastic chemotherapy: Secondary | ICD-10-CM | POA: Diagnosis not present

## 2016-08-30 DIAGNOSIS — J189 Pneumonia, unspecified organism: Secondary | ICD-10-CM | POA: Diagnosis not present

## 2016-10-17 DIAGNOSIS — Z6825 Body mass index (BMI) 25.0-25.9, adult: Secondary | ICD-10-CM | POA: Diagnosis not present

## 2016-10-17 DIAGNOSIS — Z79899 Other long term (current) drug therapy: Secondary | ICD-10-CM | POA: Diagnosis not present

## 2016-10-17 DIAGNOSIS — E785 Hyperlipidemia, unspecified: Secondary | ICD-10-CM | POA: Diagnosis not present

## 2016-10-17 DIAGNOSIS — J449 Chronic obstructive pulmonary disease, unspecified: Secondary | ICD-10-CM | POA: Diagnosis not present

## 2016-10-17 DIAGNOSIS — F39 Unspecified mood [affective] disorder: Secondary | ICD-10-CM | POA: Diagnosis not present

## 2016-10-17 DIAGNOSIS — E559 Vitamin D deficiency, unspecified: Secondary | ICD-10-CM | POA: Diagnosis not present

## 2016-10-17 DIAGNOSIS — K219 Gastro-esophageal reflux disease without esophagitis: Secondary | ICD-10-CM | POA: Diagnosis not present

## 2016-10-17 DIAGNOSIS — Z9181 History of falling: Secondary | ICD-10-CM | POA: Diagnosis not present

## 2016-10-17 DIAGNOSIS — Z1389 Encounter for screening for other disorder: Secondary | ICD-10-CM | POA: Diagnosis not present

## 2016-10-17 DIAGNOSIS — I10 Essential (primary) hypertension: Secondary | ICD-10-CM | POA: Diagnosis not present

## 2016-10-30 ENCOUNTER — Emergency Department (HOSPITAL_COMMUNITY): Payer: Medicare Other

## 2016-10-30 ENCOUNTER — Emergency Department (EMERGENCY_DEPARTMENT_HOSPITAL)
Admission: EM | Admit: 2016-10-30 | Discharge: 2016-10-30 | Disposition: A | Discharge status: Home / Self Care | Payer: Medicare Other | Source: Home / Self Care | Attending: Emergency Medicine | Admitting: Emergency Medicine

## 2016-10-30 ENCOUNTER — Encounter (HOSPITAL_COMMUNITY): Payer: Self-pay | Admitting: *Deleted

## 2016-10-30 DIAGNOSIS — S42472A Displaced transcondylar fracture of left humerus, initial encounter for closed fracture: Secondary | ICD-10-CM | POA: Diagnosis not present

## 2016-10-30 DIAGNOSIS — M79602 Pain in left arm: Secondary | ICD-10-CM | POA: Diagnosis not present

## 2016-10-30 DIAGNOSIS — S42492A Other displaced fracture of lower end of left humerus, initial encounter for closed fracture: Secondary | ICD-10-CM

## 2016-10-30 DIAGNOSIS — Z87891 Personal history of nicotine dependence: Secondary | ICD-10-CM

## 2016-10-30 DIAGNOSIS — S42412A Displaced simple supracondylar fracture without intercondylar fracture of left humerus, initial encounter for closed fracture: Secondary | ICD-10-CM

## 2016-10-30 DIAGNOSIS — W1830XA Fall on same level, unspecified, initial encounter: Secondary | ICD-10-CM

## 2016-10-30 DIAGNOSIS — S42493A Other displaced fracture of lower end of unspecified humerus, initial encounter for closed fracture: Secondary | ICD-10-CM

## 2016-10-30 DIAGNOSIS — Z79899 Other long term (current) drug therapy: Secondary | ICD-10-CM | POA: Insufficient documentation

## 2016-10-30 DIAGNOSIS — Y939 Activity, unspecified: Secondary | ICD-10-CM | POA: Insufficient documentation

## 2016-10-30 DIAGNOSIS — W19XXXA Unspecified fall, initial encounter: Secondary | ICD-10-CM

## 2016-10-30 DIAGNOSIS — S199XXA Unspecified injury of neck, initial encounter: Secondary | ICD-10-CM | POA: Diagnosis not present

## 2016-10-30 DIAGNOSIS — S42402A Unspecified fracture of lower end of left humerus, initial encounter for closed fracture: Secondary | ICD-10-CM

## 2016-10-30 DIAGNOSIS — Y999 Unspecified external cause status: Secondary | ICD-10-CM

## 2016-10-30 DIAGNOSIS — J449 Chronic obstructive pulmonary disease, unspecified: Secondary | ICD-10-CM

## 2016-10-30 DIAGNOSIS — S52502A Unspecified fracture of the lower end of left radius, initial encounter for closed fracture: Secondary | ICD-10-CM | POA: Diagnosis not present

## 2016-10-30 DIAGNOSIS — Y92009 Unspecified place in unspecified non-institutional (private) residence as the place of occurrence of the external cause: Secondary | ICD-10-CM

## 2016-10-30 DIAGNOSIS — M7989 Other specified soft tissue disorders: Secondary | ICD-10-CM | POA: Diagnosis not present

## 2016-10-30 DIAGNOSIS — T1490XA Injury, unspecified, initial encounter: Secondary | ICD-10-CM

## 2016-10-30 DIAGNOSIS — T148XXA Other injury of unspecified body region, initial encounter: Secondary | ICD-10-CM | POA: Diagnosis not present

## 2016-10-30 DIAGNOSIS — M25522 Pain in left elbow: Secondary | ICD-10-CM | POA: Diagnosis not present

## 2016-10-30 DIAGNOSIS — S0990XA Unspecified injury of head, initial encounter: Secondary | ICD-10-CM | POA: Diagnosis not present

## 2016-10-30 LAB — CBC
HCT: 35.9 % — ABNORMAL LOW (ref 36.0–46.0)
Hemoglobin: 10.9 g/dL — ABNORMAL LOW (ref 12.0–15.0)
MCH: 27.9 pg (ref 26.0–34.0)
MCHC: 30.4 g/dL (ref 30.0–36.0)
MCV: 92.1 fL (ref 78.0–100.0)
Platelets: 212 10*3/uL (ref 150–400)
RBC: 3.9 MIL/uL (ref 3.87–5.11)
RDW: 18.5 % — AB (ref 11.5–15.5)
WBC: 6.2 10*3/uL (ref 4.0–10.5)

## 2016-10-30 LAB — I-STAT CHEM 8, ED
BUN: 23 mg/dL — ABNORMAL HIGH (ref 6–20)
CHLORIDE: 105 mmol/L (ref 101–111)
Calcium, Ion: 1.21 mmol/L (ref 1.15–1.40)
Creatinine, Ser: 1 mg/dL (ref 0.44–1.00)
Glucose, Bld: 106 mg/dL — ABNORMAL HIGH (ref 65–99)
HCT: 33 % — ABNORMAL LOW (ref 36.0–46.0)
HEMOGLOBIN: 11.2 g/dL — AB (ref 12.0–15.0)
POTASSIUM: 4.2 mmol/L (ref 3.5–5.1)
SODIUM: 141 mmol/L (ref 135–145)
TCO2: 28 mmol/L (ref 0–100)

## 2016-10-30 LAB — APTT: APTT: 28 s (ref 24–36)

## 2016-10-30 LAB — PROTIME-INR
INR: 1.07
PROTHROMBIN TIME: 14 s (ref 11.4–15.2)

## 2016-10-30 IMAGING — CT CT ELBOW*L* W/O CM
3 of 6 series · 15 of 34 positions shown, 18 images · non-contrast
Comparison: Radiographs same date.

CLINICAL DATA: Left elbow fracture from fall.

EXAM:
CT OF THE UPPER LEFT EXTREMITY WITHOUT CONTRAST
TECHNIQUE: Multidetector CT imaging of the left elbow was performed according
to the standard protocol.

[Series 4: 2 1.5 mm st ax · axial · 0.33mm/px · z∈[+816,+944]mm · 9 of 101 slices shown, 12 images]
[im 8/101  soft-tissue]
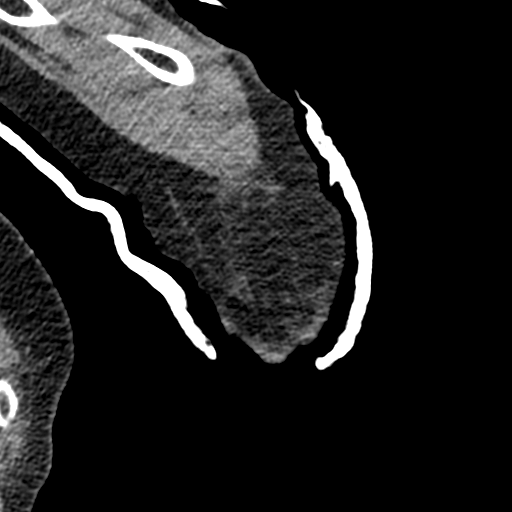
[im 8/101  bone]
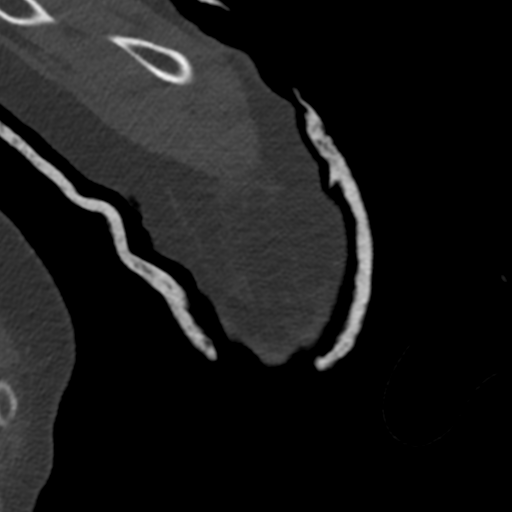
[im 24/101  bone]
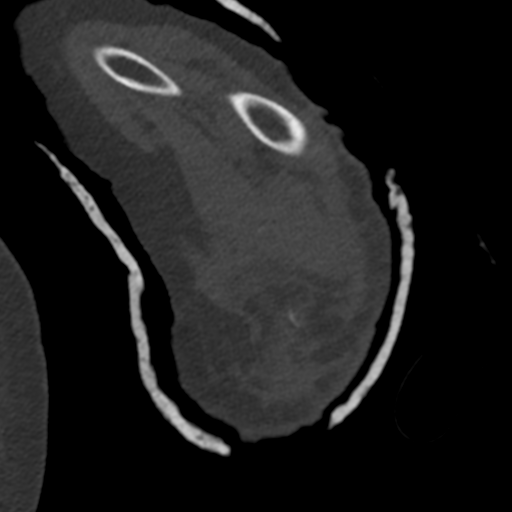
[im 31/101  bone]
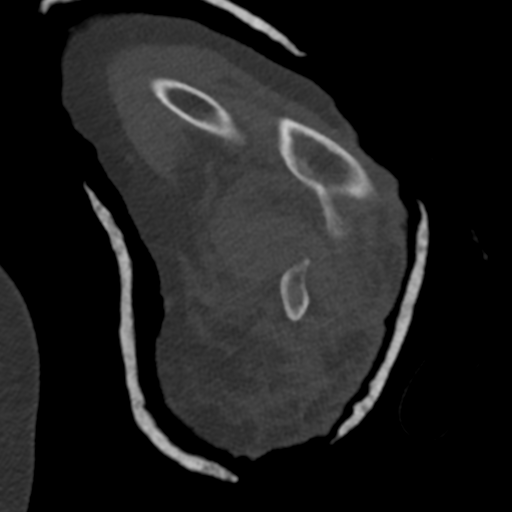
[im 39/101  bone]
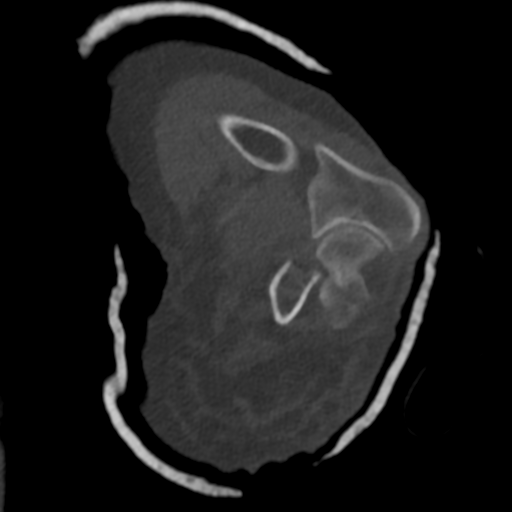
[im 54/101  soft-tissue]
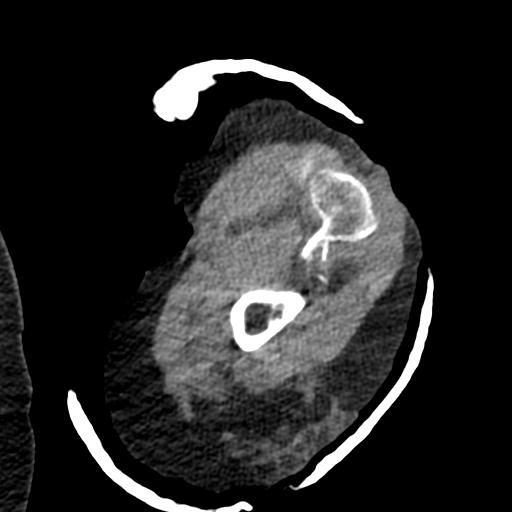
[im 54/101  bone]
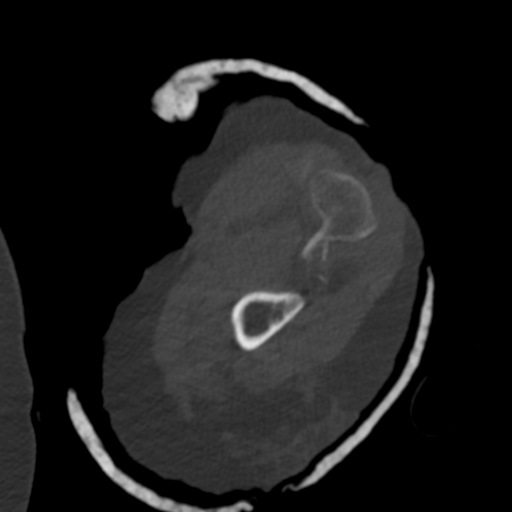
[im 62/101  bone]
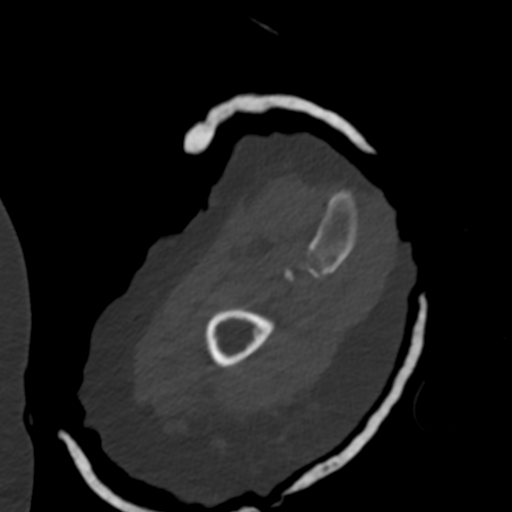
[im 70/101  bone]
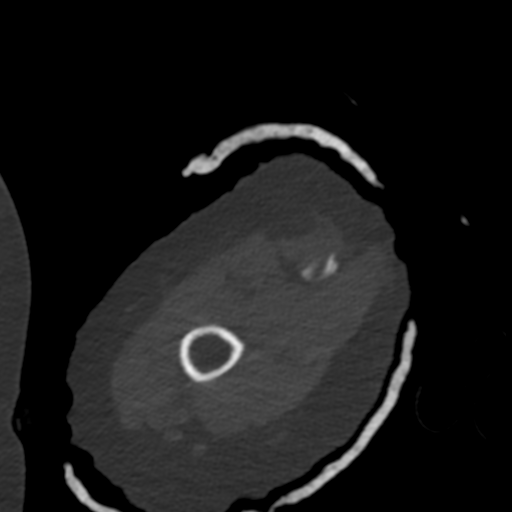
[im 85/101  bone]
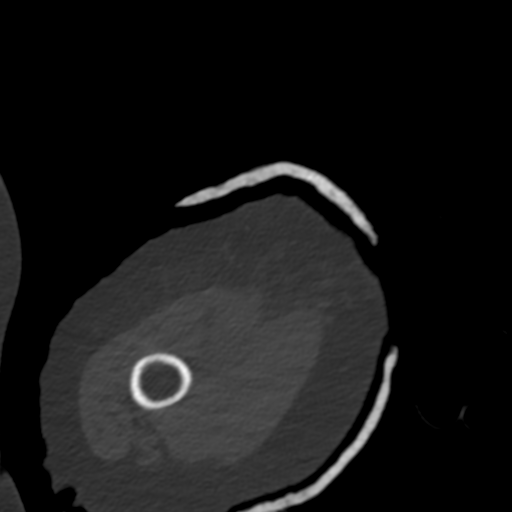
[im 93/101  soft-tissue]
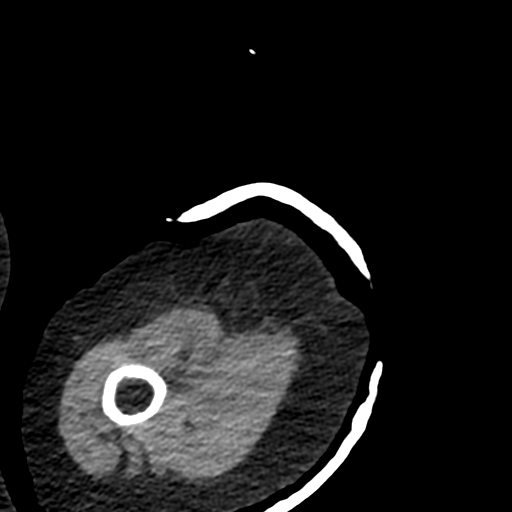
[im 93/101  bone]
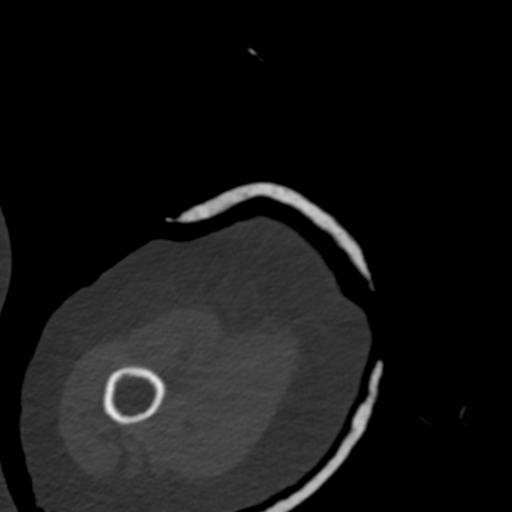

[Series 9: cor upper (id) mm st · sagittal · 0.28mm/px · 3 of 99 slices shown]
[im 14/99  bone]
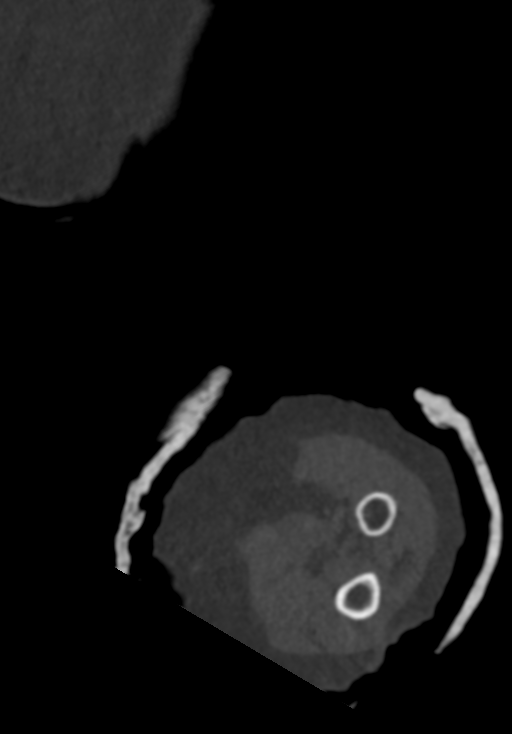
[im 56/99  bone]
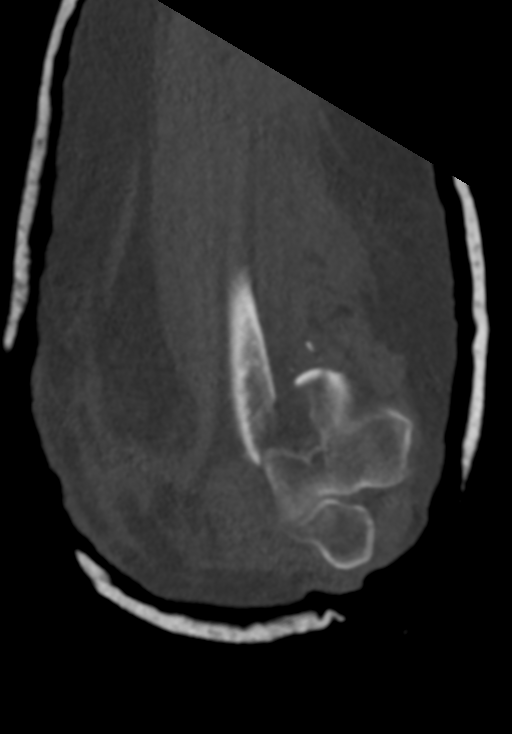
[im 75/99  soft-tissue]
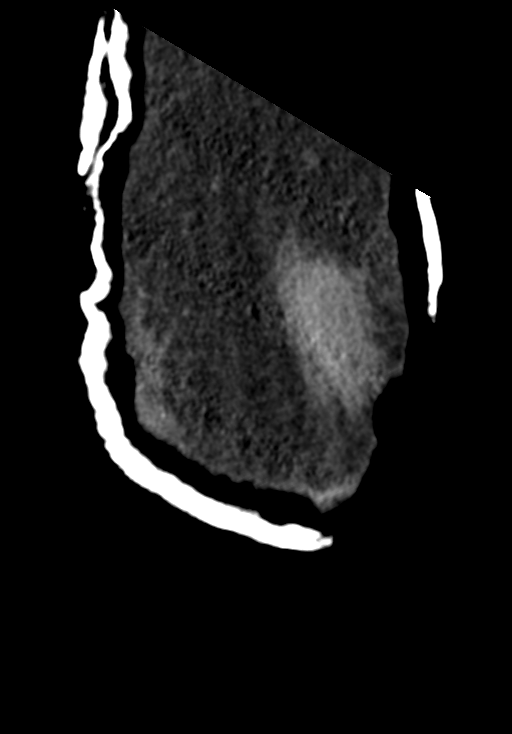

[Series 319: — · coronal · 0.21mm/px · 3 of 89 slices shown]
[im 12/89  bone]
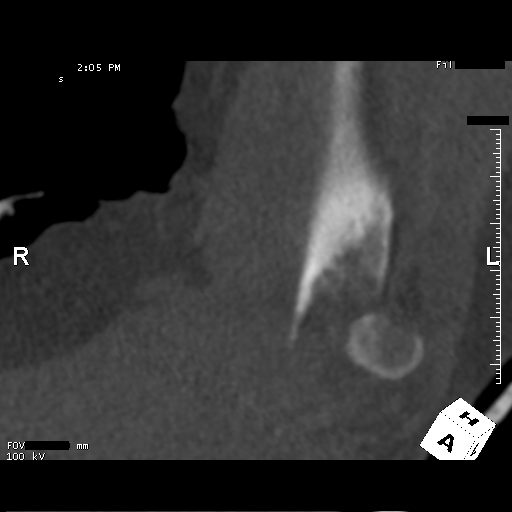
[im 31/89  bone]
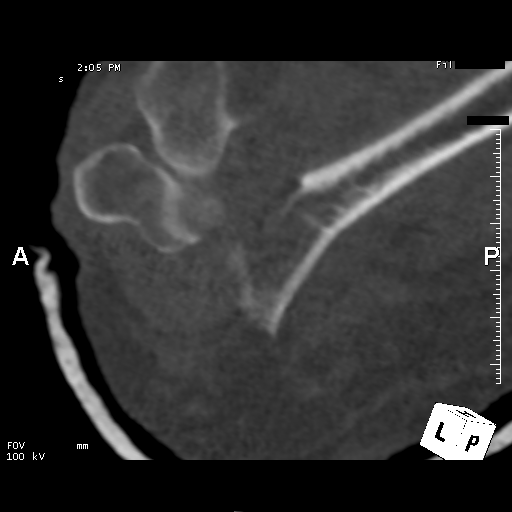
[im 50/89  bone]
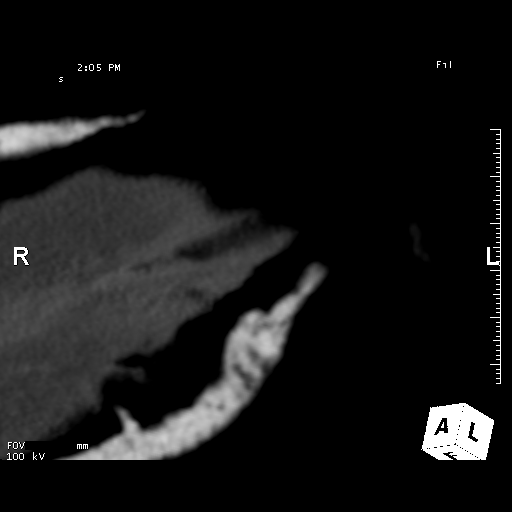

[15 of 34 positions shown; findings below may reference images not displayed]

FINDINGS: Bones/Joint/Cartilage

The elbow is splinted in flexion. There is a mildly comminuted and
significantly displaced supracondylar fracture of the distal
humerus. This fracture is displaced posterolaterally and proximally
by up to 2.2 cm. There is no intercondylar extension of the fracture
or involvement of the articular surface of either humeral condyle.
Fracture does extend into the medial humeral epicondyle.

The radiocapitellar and ulnohumeral articulations are intact. The
proximal radius and ulna are intact. No significant elbow joint
effusion.

Ligaments

Suboptimally assessed by CT.

Muscles and Tendons

The biceps and triceps tendons appear intact.

Soft tissues

There is moderate soft tissue contusion/ hemorrhage surrounding the
distal humeral fracture, greatest medially. The ulnar nerve is not
well visualized.
IMPRESSION: 1. Supracondylar extra-articular fracture of the distal humerus with
significant displacement as described.
2. No intra-articular extension common dislocation or proximal
forearm fracture.
3. Associated soft tissue injury. The ulnar nerve is not well
visualized.

## 2016-10-30 MED ORDER — OXYCODONE-ACETAMINOPHEN 5-325 MG PO TABS: 1.0000 | ORAL_TABLET | Freq: Three times a day (TID) | ORAL | 0 refills | Status: DC | PRN

## 2016-10-30 MED ORDER — FENTANYL CITRATE (PF) 100 MCG/2ML IJ SOLN
100.0000 ug | Freq: Once | INTRAMUSCULAR | Status: AC
  Administered 2016-10-30: 100 ug via INTRAVENOUS
  Filled 2016-10-30: qty 2

## 2016-10-30 NOTE — Consult Note (Signed)
Reason for Consult:Left distal humerus fx Referring Physician: J Long  Adriana Castro is an 83 y.o. female.  HPI: Adriana Castro was at home this morning when she slipped on her floor and fell onto her left arm. She had immediate pain and was brought to the ED for evaluation. X-rays showed a bicondylar left humerus fx and orthopedic surgery was consulted. She is RHD.  Past Medical History:  Diagnosis Date  . Anxiety   . Arthritis   . COPD (chronic obstructive pulmonary disease) ([place])   . Dysrhythmia    ATRIAL FIBRILATION  . Heart murmur   . Irregular heart beat   . Leaky heart valve   . Shortness of breath     Past Surgical History:  Procedure Laterality Date  . SHOULDER SURGERY Right    ?   2002     . SKIN CANCER EXCISION     X 5  . TONSILLECTOMY      Family History  Problem Relation Age of Onset  . Diabetes Mother   . Asthma Father     Social History:  reports that she quit smoking about 4 years ago. She has never used smokeless tobacco. She reports that she does not drink alcohol or use drugs.  Allergies:  Allergies  Allergen Reactions  . Albuterol Nausea And Vomiting    Medications: I have reviewed the patient's current medications.  Results for orders placed or performed during the hospital encounter of 10/30/16 (from the past 48 hour(s))  CBC     Status: Abnormal   Collection Time: 10/30/16 10:59 AM  Result Value Ref Range   WBC 6.2 4.0 - 10.5 K/uL   RBC 3.90 3.87 - 5.11 MIL/uL   Hemoglobin 10.9 (L) 12.0 - 15.0 g/dL   HCT 35.9 (L) 36.0 - 46.0 %   MCV 92.1 78.0 - 100.0 fL   MCH 27.9 26.0 - 34.0 pg   MCHC 30.4 30.0 - 36.0 g/dL   RDW 18.5 (H) 11.5 - 15.5 %   Platelets 212 150 - 400 K/uL  APTT     Status: None   Collection Time: 10/30/16 10:59 AM  Result Value Ref Range   aPTT 28 24 - 36 seconds  Protime-INR     Status: None   Collection Time: 10/30/16 10:59 AM  Result Value Ref Range   Prothrombin Time 14.0 11.4 - 15.2 seconds   INR 1.07   I-Stat Chem 8, ED      Status: Abnormal   Collection Time: 10/30/16 11:09 AM  Result Value Ref Range   Sodium 141 135 - 145 mmol/L   Potassium 4.2 3.5 - 5.1 mmol/L   Chloride 105 101 - 111 mmol/L   BUN 23 (H) 6 - 20 mg/dL   Creatinine, Ser 1.00 0.44 - 1.00 mg/dL   Glucose, Bld 106 (H) 65 - 99 mg/dL   Calcium, Ion 1.21 1.15 - 1.40 mmol/L   TCO2 28 0 - 100 mmol/L   Hemoglobin 11.2 (L) 12.0 - 15.0 g/dL   HCT 33.0 (L) 36.0 - 46.0 %    Dg Elbow Complete Left  Result Date: 10/30/2016 CLINICAL DATA:  [place] at home, elbow pain, swelling and deformity EXAM: LEFT ELBOW - COMPLETE 3+ VIEW COMPARISON:  None FINDINGS: Significant soft tissue swelling and elbow deformity. Marked osseous demineralization. Markedly displaced comminuted transcondylar fracture of the distal LEFT humerus. Joint spaces at LEFT elbow joint preserved. Ulna and radius appear intact. IMPRESSION: Markedly displaced comminuted transcondylar fracture of the distal LEFT humerus.  Electronically Signed   By: Lavonia Dana M.D.   On: 10/30/2016 11:44   Dg Wrist 2 Views Left  Result Date: 10/30/2016 CLINICAL DATA:  [place] at home, elbow pain and swelling EXAM: LEFT WRIST - 2 VIEW COMPARISON:  None FINDINGS: Marked osseous demineralization. Joint spaces preserved. Probable old healed distal LEFT ulnar diaphyseal fracture. No acute fracture, dislocation, or bone destruction. IMPRESSION: Old healed distal ulnar fracture. Osseous demineralization without acute bony abnormalities. Electronically Signed   By: Lavonia Dana M.D.   On: 10/30/2016 11:42   Ct Head Wo Contrast  Result Date: 10/30/2016 CLINICAL DATA:  [place] this morning, elbow fracture, history COPD, atrial fibrillation, former smoker EXAM: CT HEAD WITHOUT CONTRAST CT CERVICAL SPINE WITHOUT CONTRAST TECHNIQUE: Multidetector CT imaging of the head and cervical spine was performed following the standard protocol without intravenous contrast. Multiplanar CT image reconstructions of the cervical spine were also  generated. COMPARISON:  None FINDINGS: CT HEAD FINDINGS Brain: Generalized atrophy. Normal ventricular morphology. No midline shift or mass effect. Small vessel chronic ischemic changes of deep cerebral white matter. No intracranial hemorrhage, mass lesion, evidence of acute infarction, or extra-axial fluid collection. Vascular: Atherosclerotic calcification of internal carotid and vertebral arteries at skullbase Skull: Osseous demineralization.  Skull intact. Sinuses/Orbits: Bony orbits intact with clear soft tissue planes. Visualized paranasal sinuses and mastoid air cells clear. Other: N/A CT CERVICAL SPINE FINDINGS Alignment: Minimal anterolisthesis that C4-C5 and C7-T1. Remaining alignment normal Skull base and vertebrae: Vertebral body heights maintained without fracture or bone destruction. Multilevel facet degenerative changes bilaterally. Visualized skullbase intact Soft tissues and spinal canal: Prevertebral soft tissues normal thickness. 1.6 cm diameter LEFT thyroid nodule. Disc levels: Disc space narrowing endplate spur formation at C5-C6. Additional disc space narrowing C6-C7. Mild encroachment upon cervical neural foramina by uncovertebral spurs bilaterally at C5-C6 and C6-C7. Upper chest: Lung apices clear Other: Extensive atherosclerotic calcifications at the carotid bifurcations. IMPRESSION: Atrophy with small vessel chronic ischemic changes of deep cerebral white matter. No acute intracranial abnormalities. Degenerative disc and facet disease changes of the cervical spine. No acute cervical spine abnormalities. 1.6 cm diameter nonspecific LEFT thyroid nodule; followup nonemergent thyroid sonography assessment recommended. Electronically Signed   By: Lavonia Dana M.D.   On: 10/30/2016 11:53   Ct Cervical Spine Wo Contrast  Result Date: 10/30/2016 CLINICAL DATA:  [place] this morning, elbow fracture, history COPD, atrial fibrillation, former smoker EXAM: CT HEAD WITHOUT CONTRAST CT CERVICAL SPINE  WITHOUT CONTRAST TECHNIQUE: Multidetector CT imaging of the head and cervical spine was performed following the standard protocol without intravenous contrast. Multiplanar CT image reconstructions of the cervical spine were also generated. COMPARISON:  None FINDINGS: CT HEAD FINDINGS Brain: Generalized atrophy. Normal ventricular morphology. No midline shift or mass effect. Small vessel chronic ischemic changes of deep cerebral white matter. No intracranial hemorrhage, mass lesion, evidence of acute infarction, or extra-axial fluid collection. Vascular: Atherosclerotic calcification of internal carotid and vertebral arteries at skullbase Skull: Osseous demineralization.  Skull intact. Sinuses/Orbits: Bony orbits intact with clear soft tissue planes. Visualized paranasal sinuses and mastoid air cells clear. Other: N/A CT CERVICAL SPINE FINDINGS Alignment: Minimal anterolisthesis that C4-C5 and C7-T1. Remaining alignment normal Skull base and vertebrae: Vertebral body heights maintained without fracture or bone destruction. Multilevel facet degenerative changes bilaterally. Visualized skullbase intact Soft tissues and spinal canal: Prevertebral soft tissues normal thickness. 1.6 cm diameter LEFT thyroid nodule. Disc levels: Disc space narrowing endplate spur formation at C5-C6. Additional disc space narrowing C6-C7. Mild encroachment upon  cervical neural foramina by uncovertebral spurs bilaterally at C5-C6 and C6-C7. Upper chest: Lung apices clear Other: Extensive atherosclerotic calcifications at the carotid bifurcations. IMPRESSION: Atrophy with small vessel chronic ischemic changes of deep cerebral white matter. No acute intracranial abnormalities. Degenerative disc and facet disease changes of the cervical spine. No acute cervical spine abnormalities. 1.6 cm diameter nonspecific LEFT thyroid nodule; followup nonemergent thyroid sonography assessment recommended. Electronically Signed   By: Lavonia Dana M.D.   On:  10/30/2016 11:53   Dg Shoulder Left Portable  Result Date: 10/30/2016 CLINICAL DATA:  [place] at home, elbow pain with swelling and abnormal rotation EXAM: LEFT SHOULDER - 1 VIEW COMPARISON:  None FINDINGS: Examination single view due to elbow condition. Marked osseous demineralization. AC joint alignment normal. No gross evidence of glenohumeral fracture or dislocation on single AP view. Visualized LEFT ribs intact. IMPRESSION: Osseous demineralization without acute bony abnormalities on single AP view. Electronically Signed   By: Lavonia Dana M.D.   On: 10/30/2016 11:41    Review of Systems  Constitutional: Negative for weight loss.  HENT: Negative for ear discharge, ear pain, hearing loss and tinnitus.   Eyes: Negative for blurred vision, double vision, photophobia and pain.  Respiratory: Negative for cough, sputum production and shortness of breath.   Cardiovascular: Negative for chest pain.  Gastrointestinal: Negative for abdominal pain, nausea and vomiting.  Genitourinary: Negative for dysuria, flank pain, frequency and urgency.  Musculoskeletal: Positive for joint pain (Left elbow). Negative for back pain, falls, myalgias and neck pain.  Neurological: Negative for dizziness, tingling, sensory change, focal weakness, loss of consciousness and headaches.  Endo/Heme/Allergies: Does not bruise/bleed easily.  Psychiatric/Behavioral: Negative for depression, memory loss and substance abuse. The patient is not nervous/anxious.    Blood pressure (!) 161/63, pulse 60, temperature 97.7 F (36.5 C), temperature source Oral, resp. rate 16, height 5\' 7"  (1.702 m), weight 69.4 kg (153 lb), SpO2 95 %. Physical Exam  Constitutional: She appears well-developed and well-nourished. No distress.  HENT:  Head: Normocephalic.  Eyes: Conjunctivae are normal. Right eye exhibits no discharge. Left eye exhibits no discharge. No scleral icterus.  Cardiovascular: Normal rate and regular rhythm.   Respiratory:  Effort normal. No respiratory distress.  Musculoskeletal:  Right shoulder, elbow, wrist, digits- no skin wounds, nontender, no instability, no blocks to motion  Sens  Ax/R/M/U intact  Mot   Ax/ R/ PIN/ M/ AIN/ U intact  Rad 2+  Left shoulder, elbow, wrist, digits- Elbow deformed, swollen, TTP, small skin tear hand  Sens  Ax/R/M/U intact  Mot   Ax/ R/ PIN/ M/ AIN/ U limited 2/2 pain  Rad 2+  BLE No traumatic wounds, ecchymosis, or rash  Nontender  No effusions  Knee stable to varus/ valgus and anterior/posterior stress  Sens DPN, SPN, TN intact  Motor EHL, ext, flex, evers 5/5  DP 2+, PT 2+, No significant edema  Skin: She is not diaphoretic.    Assessment/Plan: Fall Left bicondylar distal humerus fx -- Closed reduction and splinting today, plan definitive care for near future by Dr. Ninfa Linden.    Lisette Abu, PA-C Orthopedic Surgery [phone] 10/30/2016, 12:34 PM

## 2016-10-30 NOTE — Progress Notes (Signed)
Orthopedic Tech Progress Note Patient Details:  Adriana Castro 1928-06-30 [id number]  Ortho Devices Type of Ortho Device: Post splint, Ace wrap, Arm sling Splint Material: Plaster Ortho Device/Splint Location: Assisted Dr. Ninfa Linden in application of left arm long arm splint.  Assisted with applicatio of arm sling on left arm.  pt tolerated well. Ortho Device/Splint Interventions: Other (comment)   Kristopher Oppenheim 10/30/2016, 1:35 PM

## 2016-10-30 NOTE — ED Triage Notes (Signed)
Pt here via [facility] for arm deformity after falling.  She states she heard someone at door and "ran" to go get her clothes on.  She slipped and landed on elbow.  No loc.  Deformity to L elbow.  Strong radial pulses, good cap refill.  100 mcg fentanyl en-route.

## 2016-10-30 NOTE — ED Notes (Signed)
Patient verbalized understanding of discharge instructions and denies any further needs or questions at this time. VS stable. Patient ambulatory with steady gait. RN assisted to ED entrance in wheelchair.

## 2016-10-30 NOTE — ED Provider Notes (Signed)
[facility] DEPT Provider Note   CSN: [id number] Arrival date & time: 10/30/16  1025     History   Chief Complaint Chief Complaint  Patient presents with  . Elbow Injury    on blood thinners    HPI Adriana Castro is a 83 y.o. female.  The history is provided by the patient.  Arm Injury   This is a new problem. The current episode started 1 to 2 hours ago. The problem occurs constantly. The problem has not changed since onset.The pain is present in the left elbow. The quality of the pain is described as constant. The pain is moderate. Associated symptoms include limited range of motion (2/2 pain). The symptoms are aggravated by activity. She has tried nothing for the symptoms. The treatment provided no relief. There has been a history of trauma.    Past Medical History:  Diagnosis Date  . Anxiety   . Arthritis   . COPD (chronic obstructive pulmonary disease) ([place])   . Dysrhythmia    ATRIAL FIBRILATION  . Heart murmur   . Irregular heart beat   . Leaky heart valve   . Shortness of breath     Patient Active Problem List   Diagnosis Date Noted  . Closed bicondylar fracture of distal end of left humerus   . COPD with acute exacerbation ([place]) 02/05/2013  . Hypoxia 02/05/2013  . Chest pain 02/05/2013  . Atrial fibrillation ([place]) 02/05/2013  . Leaky heart valve     Past Surgical History:  Procedure Laterality Date  . SHOULDER SURGERY Right    ?   2002     . SKIN CANCER EXCISION     X 5  . TONSILLECTOMY      OB History    No data available       Home Medications    Prior to Admission medications   Medication Sig Start Date End Date Taking? Authorizing Provider  amiodarone (PACERONE) 200 MG tablet Take 100 mg by mouth 2 (two) times daily. 09/12/16  Yes [provider]  diltiazem (CARDIZEM) 120 MG tablet Take 120 mg by mouth daily.   Yes [provider]  fluticasone (FLONASE) 50 MCG/ACT nasal spray Place 1 spray into both nostrils daily as  needed for allergies. 11/22/15  Yes [provider]  ipratropium (ATROVENT) 0.02 % nebulizer solution Take 500 mcg by nebulization 2 (two) times daily.    Yes [provider]  levalbuterol (XOPENEX HFA) 45 MCG/ACT inhaler Inhale 1-2 puffs into the lungs every 4 (four) hours as needed for wheezing or shortness of breath.   Yes [provider]  levalbuterol (XOPENEX) 0.63 MG/3ML nebulizer solution Take 3 mLs by nebulization every 4 (four) hours as needed for wheezing.   Yes [provider]  montelukast (SINGULAIR) 10 MG tablet Take 10 mg by mouth every evening. 10/09/16  Yes [provider]  Multiple Vitamin (MULTI-VITAMINS) TABS Take 1 tablet by mouth daily.   Yes [provider]  nitroGLYCERIN (NITROSTAT) 0.4 MG SL tablet Place 0.4 mg under the tongue every 5 (five) minutes as needed. 10/23/16  Yes [provider]  omeprazole (PRILOSEC) 20 MG capsule Take 20 mg by mouth every morning. 08/15/16  Yes [provider]  sertraline (ZOLOFT) 100 MG tablet Take 100 mg by mouth at bedtime. 10/02/16  Yes [provider]  simvastatin (ZOCOR) 10 MG tablet Take 10 mg by mouth every evening. 08/20/16  Yes [provider]  tiotropium (SPIRIVA) 18 MCG inhalation capsule  Place 18 mcg into inhaler and inhale daily as needed (wheezing).    Yes [provider]  Vitamin D, Ergocalciferol, (DRISDOL) 50000 units CAPS capsule Take 50,000 Units by mouth as needed. supplement 10/23/16  Yes [provider]  vitamin E 400 UNIT capsule Take 800 Units by mouth daily.   Yes [provider]  XARELTO 15 MG TABS tablet Take 15 mg by mouth every evening.  10/10/16  Yes [provider]  guaiFENesin (MUCINEX) 600 MG 12 hr tablet Take 2 tablets (1,200 mg total) by mouth 2 (two) times daily. Patient not taking: Reported on 10/30/2016 02/08/13   Verlee Monte, MD  levofloxacin (LEVAQUIN) 250 MG tablet Take 3 tablets (750 mg  total) by mouth every other day. Patient not taking: Reported on 10/30/2016 02/08/13   Verlee Monte, MD  oxyCODONE-acetaminophen (PERCOCET/ROXICET) 5-325 MG tablet Take 1 tablet by mouth every 8 (eight) hours as needed for severe pain. 10/30/16   Heriberto Antigua, MD  predniSONE (DELTASONE) 10 MG tablet Take 6-5-4-3-2-1 PO daily till gone Patient not taking: Reported on 10/30/2016 02/08/13   Verlee Monte, MD    Family History Family History  Problem Relation Age of Onset  . Diabetes Mother   . Asthma Father     Social History Social History  Substance Use Topics  . Smoking status: Former Smoker    Quit date: 06/07/2012  . Smokeless tobacco: Never Used  . Alcohol use No     Allergies   Albuterol   Review of Systems Review of Systems  Constitutional: Negative for fever.  HENT: Negative.   Respiratory: Negative for cough and shortness of breath.   Cardiovascular: Negative for chest pain.  Gastrointestinal: Negative for abdominal pain.  Genitourinary: Negative.   Musculoskeletal: Negative.   Skin: Negative.   Neurological: Negative.   All other systems reviewed and are negative.    Physical Exam Updated Vital Signs BP (!) 152/58 (BP Location: Right Arm)   Pulse 63   Temp 97.7 F (36.5 C) (Oral)   Resp 16   Ht 5\' 7"  (1.702 m)   Wt 69.4 kg (153 lb)   SpO2 91%   BMI 23.96 kg/m   Physical Exam  Constitutional: She is oriented to person, place, and time. She appears well-developed and well-nourished. No distress.  HENT:  Head: Normocephalic and atraumatic.  Eyes: Conjunctivae are normal. Pupils are equal, round, and reactive to light.  Neck: Normal range of motion. Neck supple.  No midline cspine ttp  Cardiovascular: Normal rate, regular rhythm, normal heart sounds and intact distal pulses.   No murmur heard. Pulmonary/Chest: Effort normal and breath sounds normal. No respiratory distress. She exhibits no tenderness.  Abdominal: Soft. There is no tenderness. There is  no rebound and no guarding.  Musculoskeletal: She exhibits tenderness and deformity. She exhibits no edema.  There is swelling and bruising to the left medial elbow without obvious deformity especially when patient tries to move her arm. No obvious shoulder tenderness or wrist tenderness. Extremity is neurovascularly intact. She has good wrist and finger range of motion as well as sensation and strength  Neurological: She is alert and oriented to person, place, and time. She exhibits normal muscle tone. Coordination normal.  Skin: Skin is warm and dry.  Healing wound to right shin from a fall about 1-2 weeks ago  Psychiatric: She has a normal mood and affect.  Nursing note and vitals reviewed.    ED Treatments / Results  Labs (all labs ordered are  listed, but only abnormal results are displayed) Labs Reviewed  CBC - Abnormal; Notable for the following:       Result Value   Hemoglobin 10.9 (*)    HCT 35.9 (*)    RDW 18.5 (*)    All other components within normal limits  I-STAT CHEM 8, ED - Abnormal; Notable for the following:    BUN 23 (*)    Glucose, Bld 106 (*)    Hemoglobin 11.2 (*)    HCT 33.0 (*)    All other components within normal limits  APTT  PROTIME-INR    EKG  EKG Interpretation None       Radiology Dg Elbow Complete Left  Result Date: 10/30/2016 CLINICAL DATA:  [place] at home, elbow pain, swelling and deformity EXAM: LEFT ELBOW - COMPLETE 3+ VIEW COMPARISON:  None FINDINGS: Significant soft tissue swelling and elbow deformity. Marked osseous demineralization. Markedly displaced comminuted transcondylar fracture of the distal LEFT humerus. Joint spaces at LEFT elbow joint preserved. Ulna and radius appear intact. IMPRESSION: Markedly displaced comminuted transcondylar fracture of the distal LEFT humerus. Electronically Signed   By: Lavonia Dana M.D.   On: 10/30/2016 11:44   Dg Wrist 2 Views Left  Result Date: 10/30/2016 CLINICAL DATA:  [place] at home, elbow pain and  swelling EXAM: LEFT WRIST - 2 VIEW COMPARISON:  None FINDINGS: Marked osseous demineralization. Joint spaces preserved. Probable old healed distal LEFT ulnar diaphyseal fracture. No acute fracture, dislocation, or bone destruction. IMPRESSION: Old healed distal ulnar fracture. Osseous demineralization without acute bony abnormalities. Electronically Signed   By: Lavonia Dana M.D.   On: 10/30/2016 11:42   Ct Head Wo Contrast  Result Date: 10/30/2016 CLINICAL DATA:  [place] this morning, elbow fracture, history COPD, atrial fibrillation, former smoker EXAM: CT HEAD WITHOUT CONTRAST CT CERVICAL SPINE WITHOUT CONTRAST TECHNIQUE: Multidetector CT imaging of the head and cervical spine was performed following the standard protocol without intravenous contrast. Multiplanar CT image reconstructions of the cervical spine were also generated. COMPARISON:  None FINDINGS: CT HEAD FINDINGS Brain: Generalized atrophy. Normal ventricular morphology. No midline shift or mass effect. Small vessel chronic ischemic changes of deep cerebral white matter. No intracranial hemorrhage, mass lesion, evidence of acute infarction, or extra-axial fluid collection. Vascular: Atherosclerotic calcification of internal carotid and vertebral arteries at skullbase Skull: Osseous demineralization.  Skull intact. Sinuses/Orbits: Bony orbits intact with clear soft tissue planes. Visualized paranasal sinuses and mastoid air cells clear. Other: N/A CT CERVICAL SPINE FINDINGS Alignment: Minimal anterolisthesis that C4-C5 and C7-T1. Remaining alignment normal Skull base and vertebrae: Vertebral body heights maintained without fracture or bone destruction. Multilevel facet degenerative changes bilaterally. Visualized skullbase intact Soft tissues and spinal canal: Prevertebral soft tissues normal thickness. 1.6 cm diameter LEFT thyroid nodule. Disc levels: Disc space narrowing endplate spur formation at C5-C6. Additional disc space narrowing C6-C7. Mild  encroachment upon cervical neural foramina by uncovertebral spurs bilaterally at C5-C6 and C6-C7. Upper chest: Lung apices clear Other: Extensive atherosclerotic calcifications at the carotid bifurcations. IMPRESSION: Atrophy with small vessel chronic ischemic changes of deep cerebral white matter. No acute intracranial abnormalities. Degenerative disc and facet disease changes of the cervical spine. No acute cervical spine abnormalities. 1.6 cm diameter nonspecific LEFT thyroid nodule; followup nonemergent thyroid sonography assessment recommended. Electronically Signed   By: Lavonia Dana M.D.   On: 10/30/2016 11:53   Ct Cervical Spine Wo Contrast  Result Date: 10/30/2016 CLINICAL DATA:  [place] this morning, elbow fracture, history COPD, atrial fibrillation, former  smoker EXAM: CT HEAD WITHOUT CONTRAST CT CERVICAL SPINE WITHOUT CONTRAST TECHNIQUE: Multidetector CT imaging of the head and cervical spine was performed following the standard protocol without intravenous contrast. Multiplanar CT image reconstructions of the cervical spine were also generated. COMPARISON:  None FINDINGS: CT HEAD FINDINGS Brain: Generalized atrophy. Normal ventricular morphology. No midline shift or mass effect. Small vessel chronic ischemic changes of deep cerebral white matter. No intracranial hemorrhage, mass lesion, evidence of acute infarction, or extra-axial fluid collection. Vascular: Atherosclerotic calcification of internal carotid and vertebral arteries at skullbase Skull: Osseous demineralization.  Skull intact. Sinuses/Orbits: Bony orbits intact with clear soft tissue planes. Visualized paranasal sinuses and mastoid air cells clear. Other: N/A CT CERVICAL SPINE FINDINGS Alignment: Minimal anterolisthesis that C4-C5 and C7-T1. Remaining alignment normal Skull base and vertebrae: Vertebral body heights maintained without fracture or bone destruction. Multilevel facet degenerative changes bilaterally. Visualized skullbase  intact Soft tissues and spinal canal: Prevertebral soft tissues normal thickness. 1.6 cm diameter LEFT thyroid nodule. Disc levels: Disc space narrowing endplate spur formation at C5-C6. Additional disc space narrowing C6-C7. Mild encroachment upon cervical neural foramina by uncovertebral spurs bilaterally at C5-C6 and C6-C7. Upper chest: Lung apices clear Other: Extensive atherosclerotic calcifications at the carotid bifurcations. IMPRESSION: Atrophy with small vessel chronic ischemic changes of deep cerebral white matter. No acute intracranial abnormalities. Degenerative disc and facet disease changes of the cervical spine. No acute cervical spine abnormalities. 1.6 cm diameter nonspecific LEFT thyroid nodule; followup nonemergent thyroid sonography assessment recommended. Electronically Signed   By: Lavonia Dana M.D.   On: 10/30/2016 11:53   Ct Elbow Left Wo Contrast  Result Date: 10/30/2016 CLINICAL DATA:  Left elbow fracture from fall. EXAM: CT OF THE UPPER LEFT EXTREMITY WITHOUT CONTRAST TECHNIQUE: Multidetector CT imaging of the left elbow was performed according to the standard protocol. COMPARISON:  Radiographs same date. FINDINGS: Bones/Joint/Cartilage The elbow is splinted in flexion. There is a mildly comminuted and significantly displaced supracondylar fracture of the distal humerus. This fracture is displaced posterolaterally and proximally by up to 2.2 cm. There is no intercondylar extension of the fracture or involvement of the articular surface of either humeral condyle. Fracture does extend into the medial humeral epicondyle. The radiocapitellar and ulnohumeral articulations are intact. The proximal radius and ulna are intact. No significant elbow joint effusion. Ligaments Suboptimally assessed by CT. Muscles and Tendons The biceps and triceps tendons appear intact. Soft tissues There is moderate soft tissue contusion/ hemorrhage surrounding the distal humeral fracture, greatest medially. The  ulnar nerve is not well visualized. IMPRESSION: 1. Supracondylar extra-articular fracture of the distal humerus with significant displacement as described. 2. No intra-articular extension common dislocation or proximal forearm fracture. 3. Associated soft tissue injury. The ulnar nerve is not well visualized. Electronically Signed   By: Richardean Sale M.D.   On: 10/30/2016 15:37   Dg Shoulder Left Portable  Result Date: 10/30/2016 CLINICAL DATA:  [place] at home, elbow pain with swelling and abnormal rotation EXAM: LEFT SHOULDER - 1 VIEW COMPARISON:  None FINDINGS: Examination single view due to elbow condition. Marked osseous demineralization. AC joint alignment normal. No gross evidence of glenohumeral fracture or dislocation on single AP view. Visualized LEFT ribs intact. IMPRESSION: Osseous demineralization without acute bony abnormalities on single AP view. Electronically Signed   By: Lavonia Dana M.D.   On: 10/30/2016 11:41    Procedures Procedures (including critical care time)  Medications Ordered in ED Medications  fentaNYL (SUBLIMAZE) injection 100 mcg (100 mcg Intravenous  Given 10/30/16 1058)     Initial Impression / Assessment and Plan / ED Course  I have reviewed the triage vital signs and the nursing notes.  Pertinent labs & imaging results that were available during my care of the patient were reviewed by me and considered in my medical decision making (see chart for details).     Patient is a 83 year old female who presents with EMS after a mechanical fall and resultant left elbow injury. She states that someone rang her doorbell and rushing to get dressed and into the door she tripped landing on her left elbow and hit her head but did not sustain loss of consciousness. She does take Xarelto for A. fib. Only complains of left elbow pain at this time. Further history and exam as above with reassuring vitals and obvious deformity and swelling to left elbow. Imaging of reveals a  displaced supracondylar fracturebut no additional finding this and her head or cervical spine. Orthopedics consult it and patient was placed in a splint and CT elbow obtained for surgical planning. She does have a thyroid nodule on CT scan. At this time patient's pain is well-controlled and she understands the plan. She will receive a call from orthopedics tomorrow for surgical planning.  I have reviewed all imaging. Patient stable for discharge home.  I have reviewed all results with the patient.  Patient agrees to stated plan. All questions answered. Advised to call or return to have any questions, new symptoms, change in symptoms, or symptoms that they do not understand.   Final Clinical Impressions(s) / ED Diagnoses   Final diagnoses:  Closed fracture of left elbow, initial encounter  Closed supracondylar fracture of left humerus, initial encounter  Fall, initial encounter    New Prescriptions Discharge Medication List as of 10/30/2016  3:45 PM    START taking these medications   Details  oxyCODONE-acetaminophen (PERCOCET/ROXICET) 5-325 MG tablet Take 1 tablet by mouth every 8 (eight) hours as needed for severe pain., Starting Mon 10/30/2016, Print         Heriberto Antigua, MD 10/31/16 1525    Margette Fast, MD 10/31/16 1950

## 2016-10-30 NOTE — Discharge Instructions (Addendum)
No lifting, pushing, or pulling with left arm. Keep splint dry and intact. Use sling for comfort. Do not take Xarelto until after surgery.

## 2016-10-30 NOTE — ED Notes (Signed)
Ortho at bedside.

## 2016-10-30 NOTE — ED Notes (Signed)
Provided patient with hot chocolate and scrub pants.

## 2016-10-31 ENCOUNTER — Other Ambulatory Visit (INDEPENDENT_AMBULATORY_CARE_PROVIDER_SITE_OTHER): Payer: Self-pay | Admitting: Orthopaedic Surgery

## 2016-10-31 DIAGNOSIS — S42412A Displaced simple supracondylar fracture without intercondylar fracture of left humerus, initial encounter for closed fracture: Secondary | ICD-10-CM

## 2016-11-01 ENCOUNTER — Encounter (HOSPITAL_COMMUNITY)
Admission: RE | Disposition: A | Discharge status: Skilled Nursing Facility | Payer: Self-pay | Source: Ambulatory Visit | Attending: Orthopaedic Surgery

## 2016-11-01 ENCOUNTER — Inpatient Hospital Stay (HOSPITAL_COMMUNITY)
Admission: RE | Admit: 2016-11-01 | Discharge: 2016-11-04 | DRG: 493 | Disposition: A | Discharge status: Skilled Nursing Facility | Payer: Medicare Other | Source: Ambulatory Visit | Attending: Orthopaedic Surgery | Admitting: Orthopaedic Surgery

## 2016-11-01 ENCOUNTER — Encounter (HOSPITAL_COMMUNITY): Payer: Self-pay | Admitting: *Deleted

## 2016-11-01 ENCOUNTER — Inpatient Hospital Stay (HOSPITAL_COMMUNITY): Payer: Medicare Other

## 2016-11-01 ENCOUNTER — Inpatient Hospital Stay (HOSPITAL_COMMUNITY): Payer: Medicare Other | Admitting: Certified Registered Nurse Anesthetist

## 2016-11-01 DIAGNOSIS — S5290XA Unspecified fracture of unspecified forearm, initial encounter for closed fracture: Secondary | ICD-10-CM | POA: Diagnosis not present

## 2016-11-01 DIAGNOSIS — Z7901 Long term (current) use of anticoagulants: Secondary | ICD-10-CM

## 2016-11-01 DIAGNOSIS — R0602 Shortness of breath: Secondary | ICD-10-CM | POA: Diagnosis not present

## 2016-11-01 DIAGNOSIS — J449 Chronic obstructive pulmonary disease, unspecified: Secondary | ICD-10-CM | POA: Diagnosis present

## 2016-11-01 DIAGNOSIS — S42412A Displaced simple supracondylar fracture without intercondylar fracture of left humerus, initial encounter for closed fracture: Secondary | ICD-10-CM

## 2016-11-01 DIAGNOSIS — R4182 Altered mental status, unspecified: Secondary | ICD-10-CM | POA: Diagnosis not present

## 2016-11-01 DIAGNOSIS — Z87891 Personal history of nicotine dependence: Secondary | ICD-10-CM | POA: Diagnosis not present

## 2016-11-01 DIAGNOSIS — Z419 Encounter for procedure for purposes other than remedying health state, unspecified: Secondary | ICD-10-CM

## 2016-11-01 DIAGNOSIS — M1991 Primary osteoarthritis, unspecified site: Secondary | ICD-10-CM | POA: Diagnosis present

## 2016-11-01 DIAGNOSIS — D62 Acute posthemorrhagic anemia: Secondary | ICD-10-CM | POA: Diagnosis not present

## 2016-11-01 DIAGNOSIS — S42472A Displaced transcondylar fracture of left humerus, initial encounter for closed fracture: Principal | ICD-10-CM | POA: Diagnosis present

## 2016-11-01 DIAGNOSIS — Z9889 Other specified postprocedural states: Secondary | ICD-10-CM

## 2016-11-01 DIAGNOSIS — Z79899 Other long term (current) drug therapy: Secondary | ICD-10-CM

## 2016-11-01 DIAGNOSIS — G5622 Lesion of ulnar nerve, left upper limb: Secondary | ICD-10-CM | POA: Diagnosis not present

## 2016-11-01 DIAGNOSIS — I10 Essential (primary) hypertension: Secondary | ICD-10-CM | POA: Diagnosis present

## 2016-11-01 DIAGNOSIS — I4891 Unspecified atrial fibrillation: Secondary | ICD-10-CM | POA: Diagnosis not present

## 2016-11-01 DIAGNOSIS — F419 Anxiety disorder, unspecified: Secondary | ICD-10-CM | POA: Diagnosis not present

## 2016-11-01 DIAGNOSIS — M1990 Unspecified osteoarthritis, unspecified site: Secondary | ICD-10-CM | POA: Diagnosis not present

## 2016-11-01 DIAGNOSIS — S42412B Displaced simple supracondylar fracture without intercondylar fracture of left humerus, initial encounter for open fracture: Secondary | ICD-10-CM | POA: Diagnosis not present

## 2016-11-01 DIAGNOSIS — S42402D Unspecified fracture of lower end of left humerus, subsequent encounter for fracture with routine healing: Secondary | ICD-10-CM | POA: Diagnosis not present

## 2016-11-01 DIAGNOSIS — I499 Cardiac arrhythmia, unspecified: Secondary | ICD-10-CM | POA: Diagnosis not present

## 2016-11-01 DIAGNOSIS — S42492A Other displaced fracture of lower end of left humerus, initial encounter for closed fracture: Secondary | ICD-10-CM | POA: Diagnosis not present

## 2016-11-01 DIAGNOSIS — R011 Cardiac murmur, unspecified: Secondary | ICD-10-CM | POA: Diagnosis not present

## 2016-11-01 DIAGNOSIS — G8911 Acute pain due to trauma: Secondary | ICD-10-CM | POA: Diagnosis not present

## 2016-11-01 DIAGNOSIS — Z888 Allergy status to other drugs, medicaments and biological substances status: Secondary | ICD-10-CM | POA: Diagnosis not present

## 2016-11-01 DIAGNOSIS — G8918 Other acute postprocedural pain: Secondary | ICD-10-CM | POA: Diagnosis not present

## 2016-11-01 DIAGNOSIS — T82223D Leakage of biological heart valve graft, subsequent encounter: Secondary | ICD-10-CM | POA: Diagnosis not present

## 2016-11-01 DIAGNOSIS — S42442A Displaced fracture (avulsion) of medial epicondyle of left humerus, initial encounter for closed fracture: Secondary | ICD-10-CM | POA: Diagnosis not present

## 2016-11-01 HISTORY — PX: ORIF HUMERUS FRACTURE: SHX2126

## 2016-11-01 HISTORY — DX: Other specified postprocedural states: Z98.890

## 2016-11-01 SURGERY — OPEN REDUCTION INTERNAL FIXATION (ORIF) DISTAL HUMERUS FRACTURE
Anesthesia: Regional | Site: Elbow | Laterality: Left

## 2016-11-01 MED ORDER — METHOCARBAMOL 1000 MG/10ML IJ SOLN
500.0000 mg | Freq: Four times a day (QID) | INTRAVENOUS | Status: DC | PRN
  Filled 2016-11-01: qty 5

## 2016-11-01 MED ORDER — DILTIAZEM HCL 60 MG PO TABS
120.0000 mg | ORAL_TABLET | Freq: Every day | ORAL | Status: DC
  Filled 2016-11-01: qty 2

## 2016-11-01 MED ORDER — LACTATED RINGERS IV SOLN
INTRAVENOUS | Status: DC
  Administered 2016-11-01: 12:00:00 via INTRAVENOUS

## 2016-11-01 MED ORDER — TIOTROPIUM BROMIDE MONOHYDRATE 18 MCG IN CAPS
18.0000 ug | ORAL_CAPSULE | Freq: Every day | RESPIRATORY_TRACT | Status: DC | PRN
  Filled 2016-11-01: qty 5

## 2016-11-01 MED ORDER — OXYCODONE-ACETAMINOPHEN 5-325 MG PO TABS
1.0000 | ORAL_TABLET | Freq: Three times a day (TID) | ORAL | Status: DC | PRN
  Administered 2016-11-02 – 2016-11-04 (×3): 1 via ORAL
  Filled 2016-11-01 (×4): qty 1

## 2016-11-01 MED ORDER — ZINC SULFATE 220 (50 ZN) MG PO CAPS
220.0000 mg | ORAL_CAPSULE | Freq: Every day | ORAL | Status: DC
  Administered 2016-11-02 – 2016-11-04 (×3): 220 mg via ORAL
  Filled 2016-11-01 (×3): qty 1

## 2016-11-01 MED ORDER — PREDNISONE 1 MG PO TABS: 3.0000 mg | ORAL_TABLET | Freq: Every day | ORAL | Status: DC

## 2016-11-01 MED ORDER — ADULT MULTIVITAMIN W/MINERALS CH
1.0000 | ORAL_TABLET | Freq: Every day | ORAL | Status: DC
  Administered 2016-11-01 – 2016-11-04 (×4): 1 via ORAL
  Filled 2016-11-01 (×4): qty 1

## 2016-11-01 MED ORDER — PREDNISONE 1 MG PO TABS: 1.0000 mg | ORAL_TABLET | Freq: Every day | ORAL | Status: DC

## 2016-11-01 MED ORDER — ZINC SULFATE 220 (50 ZN) MG PO CAPS: 220.0000 mg | ORAL_CAPSULE | Freq: Every day | ORAL | 0 refills | Status: DC

## 2016-11-01 MED ORDER — FENTANYL CITRATE (PF) 100 MCG/2ML IJ SOLN: 25.0000 ug | INTRAMUSCULAR | Status: DC | PRN

## 2016-11-01 MED ORDER — IPRATROPIUM BROMIDE 0.02 % IN SOLN
500.0000 ug | Freq: Two times a day (BID) | RESPIRATORY_TRACT | Status: DC
  Administered 2016-11-01 – 2016-11-04 (×5): 500 ug via RESPIRATORY_TRACT
  Filled 2016-11-01 (×7): qty 2.5

## 2016-11-01 MED ORDER — PREDNISONE 10 MG PO TABS: 10.0000 mg | ORAL_TABLET | Freq: Every day | ORAL | Status: DC

## 2016-11-01 MED ORDER — SUGAMMADEX SODIUM 200 MG/2ML IV SOLN
INTRAVENOUS | Status: DC | PRN
  Administered 2016-11-01: 140 mg via INTRAVENOUS

## 2016-11-01 MED ORDER — ONDANSETRON HCL 4 MG/2ML IJ SOLN
INTRAMUSCULAR | Status: AC
  Filled 2016-11-01: qty 2

## 2016-11-01 MED ORDER — PREDNISONE 1 MG PO TABS
4.0000 mg | ORAL_TABLET | Freq: Every day | ORAL | Status: DC
  Filled 2016-11-01: qty 4

## 2016-11-01 MED ORDER — LEVALBUTEROL TARTRATE 45 MCG/ACT IN AERO: 1.0000 | INHALATION_SPRAY | RESPIRATORY_TRACT | Status: DC | PRN

## 2016-11-01 MED ORDER — LEVALBUTEROL HCL 0.63 MG/3ML IN NEBU: 0.6300 mg | INHALATION_SOLUTION | RESPIRATORY_TRACT | Status: DC | PRN

## 2016-11-01 MED ORDER — PREDNISONE 1 MG PO TABS
6.0000 mg | ORAL_TABLET | Freq: Every day | ORAL | Status: AC
Start: 2016-11-03 — End: 2016-11-03
  Administered 2016-11-03: 6 mg via ORAL
  Filled 2016-11-01: qty 1

## 2016-11-01 MED ORDER — VITAMIN E 180 MG (400 UNIT) PO CAPS
800.0000 [IU] | ORAL_CAPSULE | Freq: Every day | ORAL | Status: DC
  Administered 2016-11-02 – 2016-11-04 (×3): 800 [IU] via ORAL
  Filled 2016-11-01 (×3): qty 2

## 2016-11-01 MED ORDER — PREDNISONE 1 MG PO TABS: 2.0000 mg | ORAL_TABLET | Freq: Every day | ORAL | Status: DC

## 2016-11-01 MED ORDER — VITAMIN E 180 MG (400 UNIT) PO CAPS
800.0000 [IU] | ORAL_CAPSULE | Freq: Every day | ORAL | Status: DC
  Filled 2016-11-01: qty 2

## 2016-11-01 MED ORDER — PHENYLEPHRINE 40 MCG/ML (10ML) SYRINGE FOR IV PUSH (FOR BLOOD PRESSURE SUPPORT)
PREFILLED_SYRINGE | INTRAVENOUS | Status: AC
  Filled 2016-11-01: qty 10

## 2016-11-01 MED ORDER — FENTANYL CITRATE (PF) 100 MCG/2ML IJ SOLN
25.0000 ug | Freq: Once | INTRAMUSCULAR | Status: AC
  Administered 2016-11-01: 25 ug via INTRAVENOUS

## 2016-11-01 MED ORDER — MORPHINE SULFATE (PF) 2 MG/ML IV SOLN: 1.0000 mg | INTRAVENOUS | Status: DC | PRN

## 2016-11-01 MED ORDER — ONDANSETRON HCL 4 MG/2ML IJ SOLN: 4.0000 mg | Freq: Four times a day (QID) | INTRAMUSCULAR | Status: DC | PRN

## 2016-11-01 MED ORDER — METOCLOPRAMIDE HCL 5 MG PO TABS: 5.0000 mg | ORAL_TABLET | Freq: Three times a day (TID) | ORAL | Status: DC | PRN

## 2016-11-01 MED ORDER — PHENYLEPHRINE HCL 10 MG/ML IJ SOLN
INTRAMUSCULAR | Status: DC | PRN
  Administered 2016-11-01: 15 ug/min via INTRAVENOUS

## 2016-11-01 MED ORDER — NITROGLYCERIN 0.4 MG SL SUBL: 0.4000 mg | SUBLINGUAL_TABLET | SUBLINGUAL | Status: DC | PRN

## 2016-11-01 MED ORDER — LEVALBUTEROL HCL 0.63 MG/3ML IN NEBU
0.6300 mg | INHALATION_SOLUTION | Freq: Once | RESPIRATORY_TRACT | Status: AC
  Administered 2016-11-01: 0.63 mg via RESPIRATORY_TRACT
  Filled 2016-11-01: qty 3

## 2016-11-01 MED ORDER — MULTI-VITAMINS PO TABS: 1.0000 | ORAL_TABLET | Freq: Every day | ORAL | Status: DC

## 2016-11-01 MED ORDER — CEFAZOLIN SODIUM-DEXTROSE 2-4 GM/100ML-% IV SOLN
2.0000 g | Freq: Three times a day (TID) | INTRAVENOUS | Status: AC
  Administered 2016-11-01 – 2016-11-02 (×3): 2 g via INTRAVENOUS
  Filled 2016-11-01 (×3): qty 100

## 2016-11-01 MED ORDER — PHENYLEPHRINE 40 MCG/ML (10ML) SYRINGE FOR IV PUSH (FOR BLOOD PRESSURE SUPPORT)
PREFILLED_SYRINGE | INTRAVENOUS | Status: DC | PRN
  Administered 2016-11-01: 80 ug via INTRAVENOUS
  Administered 2016-11-01 (×2): 40 ug via INTRAVENOUS

## 2016-11-01 MED ORDER — SODIUM CHLORIDE 0.9 % IV SOLN
INTRAVENOUS | Status: DC
  Administered 2016-11-01: 19:00:00 via INTRAVENOUS

## 2016-11-01 MED ORDER — ONDANSETRON HCL 4 MG/2ML IJ SOLN
INTRAMUSCULAR | Status: DC | PRN
  Administered 2016-11-01: 4 mg via INTRAVENOUS

## 2016-11-01 MED ORDER — CEFAZOLIN SODIUM-DEXTROSE 2-4 GM/100ML-% IV SOLN
2.0000 g | INTRAVENOUS | Status: AC
  Administered 2016-11-01: 2 g via INTRAVENOUS
  Filled 2016-11-01: qty 100

## 2016-11-01 MED ORDER — LIDOCAINE 2% (20 MG/ML) 5 ML SYRINGE
INTRAMUSCULAR | Status: DC | PRN
  Administered 2016-11-01: 70 mg via INTRAVENOUS

## 2016-11-01 MED ORDER — MAGNESIUM CITRATE PO SOLN: 1.0000 | Freq: Once | ORAL | Status: DC | PRN

## 2016-11-01 MED ORDER — SIMVASTATIN 10 MG PO TABS
10.0000 mg | ORAL_TABLET | Freq: Every evening | ORAL | Status: DC
  Administered 2016-11-01 – 2016-11-04 (×4): 10 mg via ORAL
  Filled 2016-11-01 (×4): qty 1

## 2016-11-01 MED ORDER — GUAIFENESIN ER 600 MG PO TB12
1200.0000 mg | ORAL_TABLET | Freq: Two times a day (BID) | ORAL | Status: DC
  Administered 2016-11-01 – 2016-11-04 (×6): 1200 mg via ORAL
  Filled 2016-11-01 (×6): qty 2

## 2016-11-01 MED ORDER — PREDNISONE 10 MG PO TABS
10.0000 mg | ORAL_TABLET | Freq: Every day | ORAL | Status: AC
  Administered 2016-11-02: 10 mg via ORAL
  Filled 2016-11-01: qty 1

## 2016-11-01 MED ORDER — ROCURONIUM BROMIDE 10 MG/ML (PF) SYRINGE
PREFILLED_SYRINGE | INTRAVENOUS | Status: AC
  Filled 2016-11-01: qty 5

## 2016-11-01 MED ORDER — ACETAMINOPHEN 325 MG PO TABS
650.0000 mg | ORAL_TABLET | Freq: Four times a day (QID) | ORAL | Status: DC | PRN
  Filled 2016-11-01: qty 2

## 2016-11-01 MED ORDER — FLUTICASONE PROPIONATE 50 MCG/ACT NA SUSP
1.0000 | Freq: Every day | NASAL | Status: DC | PRN
Start: 2016-11-01 — End: 2016-11-05
  Filled 2016-11-01: qty 16

## 2016-11-01 MED ORDER — POLYETHYLENE GLYCOL 3350 17 G PO PACK
17.0000 g | PACK | Freq: Every day | ORAL | Status: DC | PRN
  Administered 2016-11-01: 17 g via ORAL
  Filled 2016-11-01: qty 1

## 2016-11-01 MED ORDER — DIPHENHYDRAMINE HCL 12.5 MG/5ML PO ELIX: 25.0000 mg | ORAL_SOLUTION | ORAL | Status: DC | PRN

## 2016-11-01 MED ORDER — MIDAZOLAM HCL 2 MG/2ML IJ SOLN
0.5000 mg | Freq: Once | INTRAMUSCULAR | Status: AC
  Administered 2016-11-01: 0.5 mg via INTRAVENOUS

## 2016-11-01 MED ORDER — PREDNISONE 5 MG PO TABS
5.0000 mg | ORAL_TABLET | Freq: Every day | ORAL | Status: AC
  Administered 2016-11-04: 5 mg via ORAL
  Filled 2016-11-01: qty 1

## 2016-11-01 MED ORDER — PROPOFOL 10 MG/ML IV BOLUS
INTRAVENOUS | Status: DC | PRN
  Administered 2016-11-01: 20 mg via INTRAVENOUS
  Administered 2016-11-01: 80 mg via INTRAVENOUS

## 2016-11-01 MED ORDER — OXYCODONE HCL 5 MG PO TABS: 5.0000 mg | ORAL_TABLET | ORAL | 0 refills | Status: DC | PRN

## 2016-11-01 MED ORDER — METOCLOPRAMIDE HCL 5 MG/ML IJ SOLN: 5.0000 mg | Freq: Three times a day (TID) | INTRAMUSCULAR | Status: DC | PRN

## 2016-11-01 MED ORDER — MONTELUKAST SODIUM 10 MG PO TABS
10.0000 mg | ORAL_TABLET | Freq: Every evening | ORAL | Status: DC
  Administered 2016-11-01 – 2016-11-04 (×4): 10 mg via ORAL
  Filled 2016-11-01 (×4): qty 1

## 2016-11-01 MED ORDER — ONDANSETRON HCL 4 MG PO TABS: 4.0000 mg | ORAL_TABLET | Freq: Three times a day (TID) | ORAL | 0 refills | Status: DC | PRN

## 2016-11-01 MED ORDER — OXYCODONE HCL 5 MG PO TABS
5.0000 mg | ORAL_TABLET | ORAL | Status: DC | PRN
  Filled 2016-11-01: qty 3

## 2016-11-01 MED ORDER — METHOCARBAMOL 500 MG PO TABS
500.0000 mg | ORAL_TABLET | Freq: Four times a day (QID) | ORAL | Status: DC | PRN
  Administered 2016-11-02: 500 mg via ORAL
  Filled 2016-11-01 (×3): qty 1

## 2016-11-01 MED ORDER — SORBITOL 70 % SOLN: 30.0000 mL | Freq: Every day | Status: DC | PRN

## 2016-11-01 MED ORDER — BUPIVACAINE HCL (PF) 0.25 % IJ SOLN
INTRAMUSCULAR | Status: DC | PRN
  Administered 2016-11-01: 25 mL

## 2016-11-01 MED ORDER — ONDANSETRON HCL 4 MG PO TABS: 4.0000 mg | ORAL_TABLET | Freq: Four times a day (QID) | ORAL | Status: DC | PRN

## 2016-11-01 MED ORDER — SERTRALINE HCL 100 MG PO TABS
100.0000 mg | ORAL_TABLET | Freq: Every day | ORAL | Status: DC
  Administered 2016-11-01 – 2016-11-03 (×3): 100 mg via ORAL
  Filled 2016-11-01 (×3): qty 1

## 2016-11-01 MED ORDER — SUGAMMADEX SODIUM 200 MG/2ML IV SOLN
INTRAVENOUS | Status: AC
  Filled 2016-11-01: qty 2

## 2016-11-01 MED ORDER — LACTATED RINGERS IV SOLN
INTRAVENOUS | Status: DC | PRN
  Administered 2016-11-01 (×2): via INTRAVENOUS

## 2016-11-01 MED ORDER — FENTANYL CITRATE (PF) 100 MCG/2ML IJ SOLN
INTRAMUSCULAR | Status: AC
  Administered 2016-11-01: 25 ug via INTRAVENOUS
  Filled 2016-11-01: qty 2

## 2016-11-01 MED ORDER — RIVAROXABAN 15 MG PO TABS
15.0000 mg | ORAL_TABLET | Freq: Every evening | ORAL | Status: DC
  Administered 2016-11-02 – 2016-11-04 (×3): 15 mg via ORAL
  Filled 2016-11-01 (×3): qty 1

## 2016-11-01 MED ORDER — ACETAMINOPHEN 650 MG RE SUPP: 650.0000 mg | Freq: Four times a day (QID) | RECTAL | Status: DC | PRN

## 2016-11-01 MED ORDER — ROCURONIUM BROMIDE 10 MG/ML (PF) SYRINGE
PREFILLED_SYRINGE | INTRAVENOUS | Status: DC | PRN
  Administered 2016-11-01: 40 mg via INTRAVENOUS

## 2016-11-01 MED ORDER — MIDAZOLAM HCL 2 MG/2ML IJ SOLN
INTRAMUSCULAR | Status: AC
  Administered 2016-11-01: 0.5 mg via INTRAVENOUS
  Filled 2016-11-01: qty 2

## 2016-11-01 MED ORDER — AMIODARONE HCL 100 MG PO TABS
100.0000 mg | ORAL_TABLET | Freq: Two times a day (BID) | ORAL | Status: DC
  Administered 2016-11-01 – 2016-11-04 (×6): 100 mg via ORAL
  Filled 2016-11-01 (×6): qty 1

## 2016-11-01 MED ORDER — ZINC SULFATE 220 (50 ZN) MG PO CAPS: 220.0000 mg | ORAL_CAPSULE | Freq: Every day | ORAL | Status: DC

## 2016-11-01 MED ORDER — LEVOFLOXACIN 500 MG PO TABS
750.0000 mg | ORAL_TABLET | ORAL | Status: DC
  Administered 2016-11-01 – 2016-11-03 (×2): 750 mg via ORAL
  Filled 2016-11-01 (×2): qty 2

## 2016-11-01 MED ORDER — FENTANYL CITRATE (PF) 250 MCG/5ML IJ SOLN
INTRAMUSCULAR | Status: AC
  Filled 2016-11-01: qty 5

## 2016-11-01 MED ORDER — MORPHINE SULFATE (PF) 4 MG/ML IV SOLN: 1.0000 mg | INTRAVENOUS | Status: DC | PRN

## 2016-11-01 SURGICAL SUPPLY — 71 items
BANDAGE ACE 3X5.8 VEL STRL LF (GAUZE/BANDAGES/DRESSINGS) ×2 IMPLANT
BANDAGE ELASTIC 4 VELCRO ST LF (GAUZE/BANDAGES/DRESSINGS) ×2 IMPLANT
BIT DRILL 2.5X2.75 QC CALB (BIT) ×2 IMPLANT
BIT DRILL CALIBRATED 2.7 (BIT) ×2 IMPLANT
BNDG ESMARK 4X9 LF (GAUZE/BANDAGES/DRESSINGS) IMPLANT
CANISTER SUCTION 1500CC (MISCELLANEOUS) IMPLANT
COVER SURGICAL LIGHT HANDLE (MISCELLANEOUS) ×2 IMPLANT
CUFF TOURNIQUET SINGLE 18IN (TOURNIQUET CUFF) ×2 IMPLANT
CUFF TOURNIQUET SINGLE 24IN (TOURNIQUET CUFF) IMPLANT
DRAPE C-ARM 42X72 X-RAY (DRAPES) ×2 IMPLANT
DRAPE IMP U-DRAPE 54X76 (DRAPES) ×2 IMPLANT
DRAPE INCISE IOBAN 66X45 STRL (DRAPES) ×2 IMPLANT
DRAPE U-SHAPE 47X51 STRL (DRAPES) ×2 IMPLANT
DRSG TEGADERM 4X4.75 (GAUZE/BANDAGES/DRESSINGS) ×8 IMPLANT
ELECT CAUTERY BLADE 6.4 (BLADE) ×2 IMPLANT
ELECT REM PT RETURN 9FT ADLT (ELECTROSURGICAL) ×2
ELECTRODE REM PT RTRN 9FT ADLT (ELECTROSURGICAL) ×1 IMPLANT
FACESHIELD WRAPAROUND (MASK) ×2 IMPLANT
GAUZE SPONGE 4X4 12PLY STRL (GAUZE/BANDAGES/DRESSINGS) ×2 IMPLANT
GAUZE SPONGE 4X4 12PLY STRL LF (GAUZE/BANDAGES/DRESSINGS) ×2 IMPLANT
GAUZE XEROFORM 1X8 LF (GAUZE/BANDAGES/DRESSINGS) ×2 IMPLANT
GAUZE XEROFORM 5X9 LF (GAUZE/BANDAGES/DRESSINGS) ×2 IMPLANT
GLOVE SKINSENSE NS SZ7.5 (GLOVE) ×4
GLOVE SKINSENSE STRL SZ7.5 (GLOVE) ×4 IMPLANT
GOWN STRL REIN XL XLG (GOWN DISPOSABLE) ×4 IMPLANT
K-WIRE FIXATION 2.0X6 (WIRE) ×8
KIT BASIN OR (CUSTOM PROCEDURE TRAY) ×2 IMPLANT
KIT ROOM TURNOVER OR (KITS) ×2 IMPLANT
KWIRE FIXATION 2.0X6 (WIRE) ×4 IMPLANT
MANIFOLD NEPTUNE II (INSTRUMENTS) ×2 IMPLANT
NS IRRIG 1000ML POUR BTL (IV SOLUTION) ×2 IMPLANT
PACK SHOULDER (CUSTOM PROCEDURE TRAY) ×2 IMPLANT
PACK UNIVERSAL I (CUSTOM PROCEDURE TRAY) ×2 IMPLANT
PAD ABD 8X10 STRL (GAUZE/BANDAGES/DRESSINGS) ×2 IMPLANT
PAD ARMBOARD 7.5X6 YLW CONV (MISCELLANEOUS) ×4 IMPLANT
PAD CAST 4YDX4 CTTN HI CHSV (CAST SUPPLIES) ×1 IMPLANT
PADDING CAST COTTON 4X4 STRL (CAST SUPPLIES) ×1
PLATE LATERAL LEFT XLG (Plate) ×2 IMPLANT
PLATE LCK LT LG 97X10.9X2.5X10 (Plate) ×1 IMPLANT
PLATE LOCK LT LRG (Plate) ×1 IMPLANT
SCREW CORTICAL LOW PROF 3.5X20 (Screw) ×6 IMPLANT
SCREW LOCK 3.5X22 DIST TIB (Screw) ×2 IMPLANT
SCREW LOCK CORT STAR 3.5X14 (Screw) ×2 IMPLANT
SCREW LOCK CORT STAR 3.5X16 (Screw) ×8 IMPLANT
SCREW LOCK CORT STAR 3.5X18 (Screw) ×4 IMPLANT
SCREW LOCK CORT STAR 3.5X20 (Screw) ×2 IMPLANT
SCREW LOCK CORT STAR 3.5X22 (Screw) ×2 IMPLANT
SCREW LOCK CORT STAR 3.5X24 (Screw) ×2 IMPLANT
SCREW LOCK CORT STAR 3.5X32 (Screw) ×2 IMPLANT
SCREW LOCK CORT STAR 3.5X40 (Screw) ×2 IMPLANT
SCREW LOCK CORT STAR 3.5X46 (Screw) ×2 IMPLANT
SCREW LOW PROFILE 18MMX3.5MM (Screw) ×2 IMPLANT
SCREW LOW PROFILE 22MMX3.5MM (Screw) ×2 IMPLANT
SLING ARM IMMOBILIZER LRG (SOFTGOODS) ×2 IMPLANT
SPLINT FIBERGLASS 4X30 (CAST SUPPLIES) ×2 IMPLANT
SPONGE LAP 18X18 X RAY DECT (DISPOSABLE) ×4 IMPLANT
STAPLER VISISTAT 35W (STAPLE) IMPLANT
STRIP CLOSURE SKIN 1/2X4 (GAUZE/BANDAGES/DRESSINGS) IMPLANT
SUCTION FRAZIER HANDLE 10FR (MISCELLANEOUS)
SUCTION TUBE FRAZIER 10FR DISP (MISCELLANEOUS) IMPLANT
SUT ETHILON 3 0 PS 1 (SUTURE) ×4 IMPLANT
SUT VIC AB 0 CT1 27 (SUTURE) ×1
SUT VIC AB 0 CT1 27XBRD ANBCTR (SUTURE) ×1 IMPLANT
SUT VIC AB 2-0 CT1 27 (SUTURE) ×1
SUT VIC AB 2-0 CT1 TAPERPNT 27 (SUTURE) ×1 IMPLANT
SYR CONTROL 10ML LL (SYRINGE) IMPLANT
TOWEL OR 17X24 6PK STRL BLUE (TOWEL DISPOSABLE) IMPLANT
TOWEL OR 17X26 10 PK STRL BLUE (TOWEL DISPOSABLE) ×2 IMPLANT
UNDERPAD 30X30 (UNDERPADS AND DIAPERS) ×2 IMPLANT
WASHER 3.5MM (Orthopedic Implant) ×6 IMPLANT
WATER STERILE IRR 1000ML POUR (IV SOLUTION) ×2 IMPLANT

## 2016-11-01 NOTE — Progress Notes (Signed)
Pt arrived from PACU to room 5N11. Pt has foley in place, left arm dressing in place with sling. VSS, no distress noted. Family made aware of pt's location.

## 2016-11-01 NOTE — Transfer of Care (Signed)
Immediate Anesthesia Transfer of Care Note  Patient: Adriana Castro  Procedure(s) Performed: Procedure(s): OPEN REDUCTION INTERNAL FIXATION (ORIF) LEFT SUPRACONDYLAR HUMERUS AND MEDIAL EPICONDYLE (Left)  Patient Location: PACU  Anesthesia Type:General and Regional  Level of Consciousness: awake and alert   Airway & Oxygen Therapy: Patient Spontanous Breathing and Patient connected to nasal cannula oxygen  Post-op Assessment: Report given to RN, Post -op Vital signs reviewed and stable and Patient moving all extremities X 4  Post vital signs: Reviewed and stable  Last Vitals:  Vitals:   11/01/16 1310 11/01/16 1315  BP: (!) 135/46 (!) 144/35  Pulse: 66 66  Resp: 15 19  Temp:      Last Pain:  Vitals:   11/01/16 1211  TempSrc:   PainSc: 1       Patients Stated Pain Goal: 1 (09/38/18 2993)  Complications: No apparent anesthesia complications

## 2016-11-01 NOTE — Anesthesia Procedure Notes (Addendum)
Anesthesia Regional Block: Supraclavicular block   Pre-Anesthetic Checklist: ,, timeout performed,, Correct Site, Correct Laterality, Correct Procedure, Correct Position, site marked, risks and benefits discussed, Surgical consent,  Pre-op evaluation,  At surgeon's request and post-op pain management  Laterality: Left and Upper  Prep: chloraprep       Needles:   Needle Type: Echogenic Stimulator Needle     Needle Length: 9cm  Needle Gauge: 21   Needle insertion depth: 5 cm   Additional Needles:   Procedures: ultrasound guided,,,,,,,,  Narrative:  Start time: 11/01/2016 12:55 PM End time: 11/01/2016 1:05 PM Injection made incrementally with aspirations every 5 mL.  Performed by: Personally  Anesthesiologist: Mkenzie Dotts

## 2016-11-01 NOTE — Progress Notes (Signed)
Dressing noted to right lower leg.   

## 2016-11-01 NOTE — Anesthesia Preprocedure Evaluation (Addendum)
Anesthesia Evaluation  Patient identified by MRN, date of birth, ID band Patient awake    Reviewed: Allergy & Precautions, NPO status , Patient's Chart, lab work & pertinent test results  Airway Mallampati: II  TM Distance: <3 FB Neck ROM: Full    Dental  (+) Teeth Intact, Dental Advisory Given   Pulmonary shortness of breath, COPD, former smoker,    breath sounds clear to auscultation       Cardiovascular hypertension, + dysrhythmias + Valvular Problems/Murmurs  Rhythm:Regular Rate:Normal     Neuro/Psych    GI/Hepatic negative GI ROS, Neg liver ROS,   Endo/Other    Renal/GU negative Renal ROS     Musculoskeletal  (+) Arthritis ,   Abdominal   Peds  Hematology   Anesthesia Other Findings   Reproductive/Obstetrics                           Anesthesia Physical Anesthesia Plan  ASA: IV  Anesthesia Plan: General and Regional   Post-op Pain Management:    Induction: Intravenous  Airway Management Planned: Oral ETT  Additional Equipment:   Intra-op Plan:   Post-operative Plan: Extubation in OR and Possible Post-op intubation/ventilation  Informed Consent:   Plan Discussed with:   Anesthesia Plan Comments:         Anesthesia Quick Evaluation

## 2016-11-01 NOTE — Anesthesia Procedure Notes (Addendum)
Procedure Name: Intubation Date/Time: 11/01/2016 1:44 PM Performed by: Garrison [place] T Pre-anesthesia Checklist: Patient identified, Emergency Drugs available, Suction available and Patient being monitored Patient Re-evaluated:Patient Re-evaluated prior to inductionOxygen Delivery Method: Circle system utilized Preoxygenation: Pre-oxygenation with 100% oxygen Intubation Type: IV induction Ventilation: Mask ventilation without difficulty Laryngoscope Size: Mac and 3 Grade View: Grade II Tube type: Oral Tube size: 7.5 mm Number of attempts: 1 Airway Equipment and Method: Stylet Placement Confirmation: ETT inserted through vocal cords under direct vision,  positive ETCO2 and breath sounds checked- equal and bilateral Secured at: 21 cm Tube secured with: Tape Dental Injury: Teeth and Oropharynx as per pre-operative assessment  Comments: Intubation by Rhae Lerner, CRNA.

## 2016-11-01 NOTE — Op Note (Signed)
   Date of Surgery: 11/01/2016  INDICATIONS: Adriana Castro is a 83 y.o.-year-old female with a left elbow injury;  The patient did consent to the procedure after discussion of the risks and benefits.  PREOPERATIVE DIAGNOSIS:  1. Left supracondylar humerus fracture 2. Left medial humeral condyle fracture  POSTOPERATIVE DIAGNOSIS: Same.  PROCEDURE:  1. Open reduction internal fixation of left supracondylar humerus fracture without intercondylar extension 2. Open reduction internal fixation of left medial humeral epicondyle fracture 3. Subcutaneous transposition of ulnar nerve  SURGEON: N. Eduard Roux, M.D.  ASSIST: Ky Barban, RNFA.  ANESTHESIA:  general, regional  IV FLUIDS AND URINE: See anesthesia.  ESTIMATED BLOOD LOSS: Minimal mL.  IMPLANTS: Biomet  DRAINS: None  COMPLICATIONS: None.  DESCRIPTION OF PROCEDURE: The patient was brought to the operating room and placed lateral on the operating table.  The patient had been signed prior to the procedure and this was documented. The patient had the anesthesia placed by the anesthesiologist.  A time-out was performed to confirm that this was the correct patient, site, side and location. The patient did receive antibiotics prior to the incision and was re-dosed during the procedure as needed at indicated intervals.  A tourniquet was placed.  The patient had the operative extremity prepped and draped in the standard surgical fashion.    A triceps sparing posterior approach to the distal humerus was used. Full-thickness flaps were elevated. The ulnar nerve was identified in the medial arm and traced distally. The ulnar nerve was released from the arcade of Struthers, Osborne's ligament, FCU fascia and transposed anteriorly. We then turned our attention to fixation of the distal humerus. The fracture was exposed. Subperiosteal elevation was performed. The triceps was elevated off of the posterior aspect of the distal humerus. With the fracture  exposed we first realign the lateral column of the fracture. This was then provisionally clamped and fixed with K wires. The appropriate size plate was placed on the lateral aspect of the distal humerus. Nonlocking locking screws were placed through the plate using standard AO technique. Each screw had excellent purchase. The K wire was then removed and the fracture stay reduced. We then turned our attention to fixing the medial column and the separate medial epicondyle fracture. I obtained a reduction of the fracture and this was then clamped and provisionally fixed with K wire. The appropriate precontoured plate was then placed on the medial aspect of the distal humerus. Locking and nonlocking screws were then placed through the plate using standard AO technique. I used unicortical locking screws in order to fix the medial epicondyle to the rest of the distal humerus. Final x-rays were taken to confirm no intra-articular penetration. The wound was then thoroughly irrigated. The ulnar nerve was confirmed to be fully decompressed and in a subcutaneous pocket with good protection. The wound was then closed in layer fashion using 2-0 Vicryl and 3-0 nylon. Sterile dressings were applied. Patient tolerated procedure well and had no immediate complications. A posterior long-arm splint was placed.  POSTOPERATIVE PLAN: Patient will be made overnight for pain control and evaluation with occupational therapy in the morning.  Adriana Cecil, MD [place] 3:56 PM

## 2016-11-02 ENCOUNTER — Encounter (HOSPITAL_COMMUNITY): Payer: Self-pay | Admitting: Orthopaedic Surgery

## 2016-11-02 LAB — BASIC METABOLIC PANEL
Anion gap: 9 (ref 5–15)
BUN: 14 mg/dL (ref 6–20)
CALCIUM: 8 mg/dL — AB (ref 8.9–10.3)
CO2: 23 mmol/L (ref 22–32)
Chloride: 106 mmol/L (ref 101–111)
Creatinine, Ser: 1 mg/dL (ref 0.44–1.00)
GFR calc Af Amer: 57 mL/min — ABNORMAL LOW (ref >60)
GFR, EST NON AFRICAN AMERICAN: 50 mL/min — AB (ref >60)
GLUCOSE: 171 mg/dL — AB (ref 65–99)
Potassium: 3.9 mmol/L (ref 3.5–5.1)
Sodium: 138 mmol/L (ref 135–145)

## 2016-11-02 MED ORDER — DILTIAZEM HCL ER COATED BEADS 120 MG PO CP24
120.0000 mg | ORAL_CAPSULE | Freq: Every day | ORAL | Status: DC
  Administered 2016-11-02 – 2016-11-04 (×3): 120 mg via ORAL
  Filled 2016-11-02 (×3): qty 1

## 2016-11-02 NOTE — Evaluation (Signed)
Occupational Therapy Evaluation Patient Details Name: Adriana Castro MRN: [id number] DOB: [birth year] Today's Date: 11/02/2016    History of Present Illness Pt s/p fall with LUE fxs and now ORIF of left supracondylar humerus fracture; ORIF of left medial humeral epicondyle fracture Subcutaneous transposition of ulnar nerve. PHMx: COPD, anxiety, SOB   Clinical Impression   This 83 yo female admitted and underwent above presents to acute OT with deficits below (see OT problem list) thus affecting her PLOF of being totally independent and living alone. She will benefit from continued acute OT with follow up at SNF to get to a Mod I level to return home.    Follow Up Recommendations  SNF;Supervision/Assistance - 24 hour    Equipment Recommendations  Other (comment) (TBD at next venue)       Precautions / Restrictions Precautions Precautions: Fall Required Braces or Orthoses: Sling Restrictions Weight Bearing Restrictions: Yes LUE Weight Bearing: Non weight bearing      Mobility Bed Mobility Overal bed mobility: Needs Assistance Bed Mobility: Supine to Sit;Sit to Supine     Supine to sit: Mod assist;HOB elevated (use of rail) Sit to supine: Mod assist      Transfers Overall transfer level: Needs assistance Equipment used: Straight cane Transfers: Sit to/from Stand Sit to Stand: Min assist              Balance Overall balance assessment: Needs assistance Sitting-balance support: No upper extremity supported;Feet supported Sitting balance-Leahy Scale: Good     Standing balance support: Single extremity supported;During functional activity Standing balance-Leahy Scale: Poor Standing balance comment: reliant on RUE on SPC                           ADL either performed or assessed with clinical judgement   ADL Overall ADL's : Needs assistance/impaired Eating/Feeding: Set up (supported sitting)   Grooming: Moderate assistance (supported sitting)    Upper Body Bathing: Moderate assistance (supported sitting)   Lower Body Bathing: Maximal assistance (min A sit<>stand)   Upper Body Dressing : Total assistance (supported sitting)   Lower Body Dressing: Total assistance (min A sit<>stand)   Toilet Transfer: Minimal assistance;Ambulation Toilet Transfer Details (indicate cue type and reason): SPC Toileting- Clothing Manipulation and Hygiene: Maximal assistance (min A sit<>stand)               Vision Patient Visual Report: No change from baseline              Pertinent Vitals/Pain Pain Assessment: 0-10 Pain Score: 3  Pain Location: RUE Pain Descriptors / Indicators: Aching;Grimacing Pain Intervention(s): Limited activity within patient's tolerance;Repositioned     Hand Dominance Right   Extremity/Trunk Assessment Upper Extremity Assessment Upper Extremity Assessment: LUE deficits/detail LUE Deficits / Details: Has full movement of digits within constraints of casting; minimal tolerance of AAROM shoulder flexion LUE Coordination: decreased gross motor           Communication Communication Communication: No difficulties   Cognition Arousal/Alertness: Awake/alert Behavior During Therapy: Anxious (about moving ) Overall Cognitive Status: No family/caregiver present to determine baseline cognitive functioning                                 General Comments: PT stated she had been her ever since she fell on 10/30/16; per chart she was D/C'd home and came back in for sx yesterday (11/01/16); pt was aware of  where she is, the year, and why she is here              Home Living Family/patient expects to be discharged to:: [place]: Alone Available Help at Discharge:  (family may be able to A) Type of Home: House Home Access: Stairs to enter [employer] of Steps: 1 and 1 Entrance Stairs-Rails: Right;Left Home Layout: One level                Home Equipment: [employer] - single point          Prior Functioning/Environment Level of Independence: Independent                 OT Problem List: Decreased strength;Decreased range of motion;Decreased activity tolerance;Impaired balance (sitting and/or standing);Decreased safety awareness;Decreased knowledge of precautions;Impaired UE functional use;Obesity;Pain      OT Treatment/Interventions: Self-care/ADL training;DME and/or AE instruction;Patient/family education;Balance training;Therapeutic activities;Therapeutic exercise    OT Goals(Current goals can be found in the care plan section) Acute Rehab OT Goals Patient Stated Goal: to go to rehab then home OT Goal Formulation: With patient Time For Goal Achievement: 11/09/16 Potential to Achieve Goals: Good  OT Frequency: Min 2X/week   Barriers to D/C: Decreased caregiver support             AM-PAC PT "6 Clicks" Daily Activity     Outcome Measure Help from another person eating meals?: A Little Help from another person taking care of personal grooming?: A Lot Help from another person toileting, which includes using toliet, bedpan, or urinal?: A Lot Help from another person bathing (including washing, rinsing, drying)?: A Lot Help from another person to put on and taking off regular upper body clothing?: A Lot Help from another person to put on and taking off regular lower body clothing?: Total 6 Click Score: 12   End of Session Equipment Utilized During Treatment: Gait belt (sling) Nurse Communication:  (Pt needs PT consult and SNF)  Activity Tolerance: Patient limited by fatigue ("I am wore out from being up in recliner for an hour and then doing this") Patient left: in bed;with call bell/phone within reach;with bed alarm set  OT Visit Diagnosis: Unsteadiness on feet (R26.81);History of falling (Z91.81);Other symptoms and signs involving cognitive function;Pain Pain - Right/Left: Left Pain - part of body: Arm                 Time: 7741-2878 OT Time Calculation (min): 23 min Charges:  OT General Charges $OT Visit: 1 Procedure OT Evaluation $OT Eval Moderate Complexity: 1 Procedure OT Treatments $Self Care/Home Management : 8-22 mins [place], OTR/L [phone] 11/02/2016

## 2016-11-02 NOTE — Discharge Summary (Addendum)
Physician Discharge Summary      Patient ID: Adriana Castro MRN: [id number] DOB/AGE: [birth year] 83 y.o.  Admit date: 11/01/2016 Discharge date: 11/04/2016  Admission Diagnoses:  <principal problem not specified>  Discharge Diagnoses:  Active Problems:   History of open reduction and internal fixation (ORIF) procedure   Past Medical History:  Diagnosis Date  . Anxiety   . Arthritis   . COPD (chronic obstructive pulmonary disease) ([place])   . Dysrhythmia    ATRIAL FIBRILATION  . Heart murmur   . Irregular heart beat   . Leaky heart valve   . Shortness of breath     Surgeries: Procedure(s): OPEN REDUCTION INTERNAL FIXATION (ORIF) LEFT SUPRACONDYLAR HUMERUS AND MEDIAL EPICONDYLE on 11/01/2016   Consultants (if any):   Discharged Condition: Improved  Hospital Course: Adriana Castro is an 83 y.o. female who was admitted 11/01/2016 with a diagnosis of <principal problem not specified> and went to the operating room on 11/01/2016 and underwent the above named procedures.    She was given perioperative antibiotics:  Anti-infectives    Start     Dose/Rate Route Frequency Ordered Stop   11/01/16 2200  ceFAZolin (ANCEF) IVPB 2g/100 mL premix     2 g 200 mL/hr over 30 Minutes Intravenous Every 8 hours 11/01/16 1852 11/02/16 1510   11/01/16 2000  levofloxacin (LEVAQUIN) tablet 750 mg     750 mg Oral Every 48 hours 11/01/16 1852     11/01/16 1130  ceFAZolin (ANCEF) IVPB 2g/100 mL premix     2 g 200 mL/hr over 30 Minutes Intravenous On call to O.R. 11/01/16 1129 11/01/16 1400    .  She was given sequential compression devices, early ambulation for DVT prophylaxis.  She benefited maximally from the hospital stay and there were no complications.    Recent vital signs:  Vitals:   11/02/16 2000 11/03/16 0449  BP: (!) 118/40 (!) 125/50  Pulse: 86 66  Resp: 18 20  Temp: 98.6 F (37 C) 98.6 F (37 C)    Recent laboratory studies:  Lab Results  Component Value Date   HGB 11.2  (L) 10/30/2016   HGB 10.9 (L) 10/30/2016   HGB 12.6 02/07/2013   Lab Results  Component Value Date   WBC 6.2 10/30/2016   PLT 212 10/30/2016   Lab Results  Component Value Date   INR 1.07 10/30/2016   Lab Results  Component Value Date   NA 138 11/03/2016   K 4.7 11/03/2016   CL 106 11/03/2016   CO2 24 11/03/2016   BUN 16 11/03/2016   CREATININE 0.83 11/03/2016   GLUCOSE 137 (H) 11/03/2016    Discharge Medications:   Allergies as of 11/03/2016      Reactions   Albuterol Nausea And Vomiting      Medication List    TAKE these medications   amiodarone 200 MG tablet Commonly known as:  PACERONE Take 100 mg by mouth 2 (two) times daily.   diltiazem 120 MG tablet Commonly known as:  CARDIZEM Take 120 mg by mouth daily.   fluticasone 50 MCG/ACT nasal spray Commonly known as:  FLONASE Place 1 spray into both nostrils daily as needed for allergies.   guaiFENesin 600 MG 12 hr tablet Commonly known as:  MUCINEX Take 2 tablets (1,200 mg total) by mouth 2 (two) times daily.   ipratropium 0.02 % nebulizer solution Commonly known as:  ATROVENT Take 500 mcg by nebulization 2 (two) times daily.   levalbuterol 0.63 MG/3ML nebulizer solution  Commonly known as:  XOPENEX Take 3 mLs by nebulization every 4 (four) hours as needed for wheezing.   levalbuterol 45 MCG/ACT inhaler Commonly known as:  XOPENEX HFA Inhale 1-2 puffs into the lungs every 4 (four) hours as needed for wheezing or shortness of breath.   levofloxacin 250 MG tablet Commonly known as:  LEVAQUIN Take 3 tablets (750 mg total) by mouth every other day.   montelukast 10 MG tablet Commonly known as:  SINGULAIR Take 10 mg by mouth every evening.   MULTI-VITAMINS Tabs Take 1 tablet by mouth daily.   nitroGLYCERIN 0.4 MG SL tablet Commonly known as:  NITROSTAT Place 0.4 mg under the tongue every 5 (five) minutes as needed.   omeprazole 20 MG capsule Commonly known as:  PRILOSEC Take 20 mg by mouth every  morning.   ondansetron 4 MG tablet Commonly known as:  ZOFRAN Take 1-2 tablets (4-8 mg total) by mouth every 8 (eight) hours as needed for nausea or vomiting.   oxyCODONE 5 MG immediate release tablet Commonly known as:  Oxy IR/ROXICODONE Take 1-3 tablets (5-15 mg total) by mouth every 4 (four) hours as needed.   oxyCODONE-acetaminophen 5-325 MG tablet Commonly known as:  PERCOCET/ROXICET Take 1 tablet by mouth every 8 (eight) hours as needed for severe pain.   predniSONE 10 MG tablet Commonly known as:  DELTASONE Take 6-5-4-3-2-1 PO daily till gone   sertraline 100 MG tablet Commonly known as:  ZOLOFT Take 100 mg by mouth at bedtime.   simvastatin 10 MG tablet Commonly known as:  ZOCOR Take 10 mg by mouth every evening.   tiotropium 18 MCG inhalation capsule Commonly known as:  SPIRIVA Place 18 mcg into inhaler and inhale daily as needed (wheezing).   Vitamin D (Ergocalciferol) 50000 units Caps capsule Commonly known as:  DRISDOL Take 50,000 Units by mouth as needed. supplement   vitamin E 400 UNIT capsule Take 800 Units by mouth daily.   XARELTO 15 MG Tabs tablet Generic drug:  Rivaroxaban Take 15 mg by mouth every evening.   zinc sulfate 220 (50 Zn) MG capsule Take 1 capsule (220 mg total) by mouth daily.            Durable Medical Equipment        Start     Ordered   11/02/16 1501  For home use only DME Bedside commode  Once    Question:  Patient needs a bedside commode to treat with the following condition  Answer:  Impaired mobility and ADLs   11/02/16 1502   11/01/16 1853  DME 3 n 1  Once     11/01/16 1852      Diagnostic Studies: Dg Elbow 2 Views Left  Result Date: 11/01/2016 CLINICAL DATA:  [place], pain EXAM: DG C-ARM 61-120 MIN; LEFT ELBOW - 2 VIEW COMPARISON:  Multiple priors. FINDINGS: Open reduction internal fixation of a supracondylar humerus fracture. Improved position and alignment. IMPRESSION: As above. Electronically Signed   By: Staci Righter M.D.   On: 11/01/2016 16:40   Dg Elbow Complete Left  Result Date: 10/30/2016 CLINICAL DATA:  [place] at home, elbow pain, swelling and deformity EXAM: LEFT ELBOW - COMPLETE 3+ VIEW COMPARISON:  None FINDINGS: Significant soft tissue swelling and elbow deformity. Marked osseous demineralization. Markedly displaced comminuted transcondylar fracture of the distal LEFT humerus. Joint spaces at LEFT elbow joint preserved. Ulna and radius appear intact. IMPRESSION: Markedly displaced comminuted transcondylar fracture of the distal LEFT humerus. Electronically Signed  By: Lavonia Dana M.D.   On: 10/30/2016 11:44   Dg Wrist 2 Views Left  Result Date: 10/30/2016 CLINICAL DATA:  [place] at home, elbow pain and swelling EXAM: LEFT WRIST - 2 VIEW COMPARISON:  None FINDINGS: Marked osseous demineralization. Joint spaces preserved. Probable old healed distal LEFT ulnar diaphyseal fracture. No acute fracture, dislocation, or bone destruction. IMPRESSION: Old healed distal ulnar fracture. Osseous demineralization without acute bony abnormalities. Electronically Signed   By: Lavonia Dana M.D.   On: 10/30/2016 11:42   Ct Head Wo Contrast  Result Date: 10/30/2016 CLINICAL DATA:  [place] this morning, elbow fracture, history COPD, atrial fibrillation, former smoker EXAM: CT HEAD WITHOUT CONTRAST CT CERVICAL SPINE WITHOUT CONTRAST TECHNIQUE: Multidetector CT imaging of the head and cervical spine was performed following the standard protocol without intravenous contrast. Multiplanar CT image reconstructions of the cervical spine were also generated. COMPARISON:  None FINDINGS: CT HEAD FINDINGS Brain: Generalized atrophy. Normal ventricular morphology. No midline shift or mass effect. Small vessel chronic ischemic changes of deep cerebral white matter. No intracranial hemorrhage, mass lesion, evidence of acute infarction, or extra-axial fluid collection. Vascular: Atherosclerotic calcification of internal carotid and vertebral  arteries at skullbase Skull: Osseous demineralization.  Skull intact. Sinuses/Orbits: Bony orbits intact with clear soft tissue planes. Visualized paranasal sinuses and mastoid air cells clear. Other: N/A CT CERVICAL SPINE FINDINGS Alignment: Minimal anterolisthesis that C4-C5 and C7-T1. Remaining alignment normal Skull base and vertebrae: Vertebral body heights maintained without fracture or bone destruction. Multilevel facet degenerative changes bilaterally. Visualized skullbase intact Soft tissues and spinal canal: Prevertebral soft tissues normal thickness. 1.6 cm diameter LEFT thyroid nodule. Disc levels: Disc space narrowing endplate spur formation at C5-C6. Additional disc space narrowing C6-C7. Mild encroachment upon cervical neural foramina by uncovertebral spurs bilaterally at C5-C6 and C6-C7. Upper chest: Lung apices clear Other: Extensive atherosclerotic calcifications at the carotid bifurcations. IMPRESSION: Atrophy with small vessel chronic ischemic changes of deep cerebral white matter. No acute intracranial abnormalities. Degenerative disc and facet disease changes of the cervical spine. No acute cervical spine abnormalities. 1.6 cm diameter nonspecific LEFT thyroid nodule; followup nonemergent thyroid sonography assessment recommended. Electronically Signed   By: Lavonia Dana M.D.   On: 10/30/2016 11:53   Ct Cervical Spine Wo Contrast  Result Date: 10/30/2016 CLINICAL DATA:  [place] this morning, elbow fracture, history COPD, atrial fibrillation, former smoker EXAM: CT HEAD WITHOUT CONTRAST CT CERVICAL SPINE WITHOUT CONTRAST TECHNIQUE: Multidetector CT imaging of the head and cervical spine was performed following the standard protocol without intravenous contrast. Multiplanar CT image reconstructions of the cervical spine were also generated. COMPARISON:  None FINDINGS: CT HEAD FINDINGS Brain: Generalized atrophy. Normal ventricular morphology. No midline shift or mass effect. Small vessel chronic  ischemic changes of deep cerebral white matter. No intracranial hemorrhage, mass lesion, evidence of acute infarction, or extra-axial fluid collection. Vascular: Atherosclerotic calcification of internal carotid and vertebral arteries at skullbase Skull: Osseous demineralization.  Skull intact. Sinuses/Orbits: Bony orbits intact with clear soft tissue planes. Visualized paranasal sinuses and mastoid air cells clear. Other: N/A CT CERVICAL SPINE FINDINGS Alignment: Minimal anterolisthesis that C4-C5 and C7-T1. Remaining alignment normal Skull base and vertebrae: Vertebral body heights maintained without fracture or bone destruction. Multilevel facet degenerative changes bilaterally. Visualized skullbase intact Soft tissues and spinal canal: Prevertebral soft tissues normal thickness. 1.6 cm diameter LEFT thyroid nodule. Disc levels: Disc space narrowing endplate spur formation at C5-C6. Additional disc space narrowing C6-C7. Mild encroachment upon cervical neural foramina by  uncovertebral spurs bilaterally at C5-C6 and C6-C7. Upper chest: Lung apices clear Other: Extensive atherosclerotic calcifications at the carotid bifurcations. IMPRESSION: Atrophy with small vessel chronic ischemic changes of deep cerebral white matter. No acute intracranial abnormalities. Degenerative disc and facet disease changes of the cervical spine. No acute cervical spine abnormalities. 1.6 cm diameter nonspecific LEFT thyroid nodule; followup nonemergent thyroid sonography assessment recommended. Electronically Signed   By: Lavonia Dana M.D.   On: 10/30/2016 11:53   Ct Elbow Left Wo Contrast  Result Date: 10/30/2016 CLINICAL DATA:  Left elbow fracture from fall. EXAM: CT OF THE UPPER LEFT EXTREMITY WITHOUT CONTRAST TECHNIQUE: Multidetector CT imaging of the left elbow was performed according to the standard protocol. COMPARISON:  Radiographs same date. FINDINGS: Bones/Joint/Cartilage The elbow is splinted in flexion. There is a mildly  comminuted and significantly displaced supracondylar fracture of the distal humerus. This fracture is displaced posterolaterally and proximally by up to 2.2 cm. There is no intercondylar extension of the fracture or involvement of the articular surface of either humeral condyle. Fracture does extend into the medial humeral epicondyle. The radiocapitellar and ulnohumeral articulations are intact. The proximal radius and ulna are intact. No significant elbow joint effusion. Ligaments Suboptimally assessed by CT. Muscles and Tendons The biceps and triceps tendons appear intact. Soft tissues There is moderate soft tissue contusion/ hemorrhage surrounding the distal humeral fracture, greatest medially. The ulnar nerve is not well visualized. IMPRESSION: 1. Supracondylar extra-articular fracture of the distal humerus with significant displacement as described. 2. No intra-articular extension common dislocation or proximal forearm fracture. 3. Associated soft tissue injury. The ulnar nerve is not well visualized. Electronically Signed   By: Richardean Sale M.D.   On: 10/30/2016 15:37   Dg Shoulder Left Portable  Result Date: 10/30/2016 CLINICAL DATA:  [place] at home, elbow pain with swelling and abnormal rotation EXAM: LEFT SHOULDER - 1 VIEW COMPARISON:  None FINDINGS: Examination single view due to elbow condition. Marked osseous demineralization. AC joint alignment normal. No gross evidence of glenohumeral fracture or dislocation on single AP view. Visualized LEFT ribs intact. IMPRESSION: Osseous demineralization without acute bony abnormalities on single AP view. Electronically Signed   By: Lavonia Dana M.D.   On: 10/30/2016 11:41   Dg C-arm 1-60 Min  Result Date: 11/01/2016 CLINICAL DATA:  [place], pain EXAM: DG C-ARM 61-120 MIN; LEFT ELBOW - 2 VIEW COMPARISON:  Multiple priors. FINDINGS: Open reduction internal fixation of a supracondylar humerus fracture. Improved position and alignment. IMPRESSION: As above.  Electronically Signed   By: Staci Righter M.D.   On: 11/01/2016 16:40    Disposition: 01-Home or Self Care  Discharge Instructions    Call MD / Call 911    Complete by:  As directed    If you experience chest pain or shortness of breath, CALL 911 and be transported to the hospital emergency room.  If you develope a fever above 101.5 F, pus (white drainage) or increased drainage or redness at the wound, or calf pain, call your surgeon's office.   Call MD / Call 911    Complete by:  As directed    If you experience chest pain or shortness of breath, CALL 911 and be transported to the hospital emergency room.  If you develope a fever above 101.5 F, pus (white drainage) or increased drainage or redness at the wound, or calf pain, call your surgeon's office.   Constipation Prevention    Complete by:  As directed    Drink  plenty of fluids.  Prune juice may be helpful.  You may use a stool softener, such as Colace (over the counter) 100 mg twice a day.  Use MiraLax (over the counter) for constipation as needed.   Constipation Prevention    Complete by:  As directed    Drink plenty of fluids.  Prune juice may be helpful.  You may use a stool softener, such as Colace (over the counter) 100 mg twice a day.  Use MiraLax (over the counter) for constipation as needed.   Driving restrictions    Complete by:  As directed    No driving while taking narcotic pain meds.   Driving restrictions    Complete by:  As directed    No driving while taking narcotic pain meds.   Increase activity slowly as tolerated    Complete by:  As directed    Increase activity slowly as tolerated    Complete by:  As directed       Follow-up Information    Leandrew Koyanagi, MD In 2 weeks.   Specialty:  Orthopedic Surgery Why:  For suture removal, For wound re-check Contact information: [address] [place] [address] [phone]            Signed: Eduard Roux 11/03/2016, 3:11 PM

## 2016-11-02 NOTE — Anesthesia Postprocedure Evaluation (Signed)
Anesthesia Post Note  Patient: Adriana Castro  Procedure(s) Performed: Procedure(s) (LRB): OPEN REDUCTION INTERNAL FIXATION (ORIF) LEFT SUPRACONDYLAR HUMERUS AND MEDIAL EPICONDYLE (Left)  Patient location during evaluation: PACU Anesthesia Type: Regional and General Level of consciousness: awake and oriented Pain management: pain level controlled Vital Signs Assessment: post-procedure vital signs reviewed and stable Respiratory status: spontaneous breathing, nonlabored ventilation, respiratory function stable and patient connected to nasal cannula oxygen Cardiovascular status: blood pressure returned to baseline and stable Postop Assessment: no signs of nausea or vomiting Anesthetic complications: no       Last Vitals:  Vitals:   11/02/16 0045 11/02/16 0453  BP: (!) 117/45 (!) 111/45  Pulse: 77 75  Resp: 18 18  Temp: 36.9 C 37.1 C    Last Pain:  Vitals:   11/02/16 0453  TempSrc: Oral  PainSc:                  Tenzin Pavon,JAMES TERRILL

## 2016-11-02 NOTE — Discharge Instructions (Signed)
Postoperative instructions:  Weightbearing instructions: non weight bearing  Dressing instructions: Keep your dressing and/or splint clean and dry at all times.  It will be removed at your first post-operative appointment.  Your stitches and/or staples will be removed at this visit.  Incision instructions:  Do not soak your incision for 3 weeks after surgery.  If the incision gets wet, pat dry and do not scrub the incision.  Pain control:  You have been given a prescription to be taken as directed for post-operative pain control.  In addition, elevate the operative extremity above the heart at all times to prevent swelling and throbbing pain.  Take over-the-counter Colace, 100mg  by mouth twice a day while taking narcotic pain medications to help prevent constipation.  Follow up appointments: 1) 10-14 days for suture removal and wound check. 2) Dr. Erlinda Hong as scheduled.   -------------------------------------------------------------------------------------------------------------  After Surgery Pain Control:  After your surgery, post-surgical discomfort or pain is likely. This discomfort can last several days to a few weeks. At certain times of the day your discomfort may be more intense.  Did you receive a nerve block?  A nerve block can provide pain relief for one hour to two days after your surgery. As long as the nerve block is working, you will experience little or no sensation in the area the surgeon operated on.  As the nerve block wears off, you will begin to experience pain or discomfort. It is very important that you begin taking your prescribed pain medication before the nerve block fully wears off. Treating your pain at the first sign of the block wearing off will ensure your pain is better controlled and more tolerable when full-sensation returns. Do not wait until the pain is intolerable, as the medicine will be less effective. It is better to treat pain in advance than to try and  catch up.  General Anesthesia:  If you did not receive a nerve block during your surgery, you will need to start taking your pain medication shortly after your surgery and should continue to do so as prescribed by your surgeon.  Pain Medication:  Most commonly we prescribe Vicodin and Percocet for post-operative pain. Both of these medications contain a combination of acetaminophen (Tylenol) and a narcotic to help control pain.   It takes between 30 and 45 minutes before pain medication starts to work. It is important to take your medication before your pain level gets too intense.   Nausea is a common side effect of many pain medications. You will want to eat something before taking your pain medicine to help prevent nausea.   If you are taking a prescription pain medication that contains acetaminophen, we recommend that you do not take additional over the counter acetaminophen (Tylenol).  Other pain relieving options:   Using a cold pack to ice the affected area a few times a day (15 to 20 minutes at a time) can help to relieve pain, reduce swelling and bruising.   Elevation of the affected area can also help to reduce pain and swelling.        Information on my medicine - XARELTO (Rivaroxaban)  This medication education was reviewed with me or my healthcare representative as part of my discharge preparation.  The pharmacist that spoke with me during my hospital stay was:  Saundra Shelling, [facility]  Why was Xarelto prescribed for you? Xarelto was prescribed for you to reduce the risk of a blood clot forming that can cause a stroke  if you have a medical condition called atrial fibrillation (a type of irregular heartbeat).  What do you need to know about xarelto ? Take your Xarelto ONCE DAILY at the same time every day with your evening meal. If you have difficulty swallowing the tablet whole, you may crush it and mix in applesauce just prior to taking your dose.  Take Xarelto  exactly as prescribed by your doctor and DO NOT stop taking Xarelto without talking to the doctor who prescribed the medication.  Stopping without other stroke prevention medication to take the place of Xarelto may increase your risk of developing a clot that causes a stroke.  Refill your prescription before you run out.  After discharge, you should have regular check-up appointments with your healthcare provider that is prescribing your Xarelto.  In the future your dose may need to be changed if your kidney function or weight changes by a significant amount.  What do you do if you miss a dose? If you are taking Xarelto ONCE DAILY and you miss a dose, take it as soon as you remember on the same day then continue your regularly scheduled once daily regimen the next day. Do not take two doses of Xarelto at the same time or on the same day.   Important Safety Information A possible side effect of Xarelto is bleeding. You should call your healthcare provider right away if you experience any of the following: ? Bleeding from an injury or your nose that does not stop. ? Unusual colored urine (red or dark brown) or unusual colored stools (red or black). ? Unusual bruising for unknown reasons. ? A serious fall or if you hit your head (even if there is no bleeding).  Some medicines may interact with Xarelto and might increase your risk of bleeding while on Xarelto. To help avoid this, consult your healthcare provider or pharmacist prior to using any new prescription or non-prescription medications, including herbals, vitamins, non-steroidal anti-inflammatory drugs (NSAIDs) and supplements.  This website has more information on Xarelto: [email or web address]-benson.[email or web address]

## 2016-11-02 NOTE — Progress Notes (Signed)
   Subjective:  Patient reports pain as mild.    Objective:   VITALS:   Vitals:   11/01/16 1959 11/01/16 2028 11/02/16 0045 11/02/16 0453  BP:  (!) 114/48 (!) 117/45 (!) 111/45  Pulse:  67 77 75  Resp:  18 18 18   Temp:  98.2 F (36.8 C) 98.4 F (36.9 C) 98.7 F (37.1 C)  TempSrc:  Oral Oral Oral  SpO2: 92% 93% 92% 93%  Weight:      Height:        Neurologically intact Neurovascular intact Sensation intact distally Intact pulses distally Incision: dressing C/D/I and no drainage No cellulitis present Compartment soft   Lab Results  Component Value Date   WBC 6.2 10/30/2016   HGB 11.2 (L) 10/30/2016   HCT 33.0 (L) 10/30/2016   MCV 92.1 10/30/2016   PLT 212 10/30/2016     Assessment/Plan:  1 Day Post-Op   - Expected postop acute blood loss anemia - will monitor for symptoms - Up with PT/OT - DVT ppx - SCDs, ambulation - NWB operative extremity, splint to stay on until f/u appt - Pain control - Discharge planning - home today after OT  Eduard Roux 11/02/2016, 7:29 AM [phone]

## 2016-11-02 NOTE — Progress Notes (Signed)
Occupational Therapy Treatment Patient Details Name: Adriana Castro MRN: [id number] DOB: [birth year] Today's Date: 11/02/2016    History of present illness Pt s/p fall with LUE fxs and now ORIF of left supracondylar humerus fracture; ORIF of left medial humeral epicondyle fracture Subcutaneous transposition of ulnar nerve. PHMx: COPD, anxiety, SOB   OT comments  This 83 yo female seen for a second session today due to pt being D/C'd home instead of SNF (my recommendation as of this morning). Seen with family in room and they voiced concern about her going home as well and I continued during session to feel SNF is best option due to her decreased mobility and increased need for A mobility and self care wise. During session pt's O2 sats dropped to 85% on RA and could only get her to come back up to above 90 with 2 liters (93%) with purse lipped breathing and had her do inspirometer. Strongly feel pt needs SNF.    Follow Up Recommendations  SNF;Supervision/Assistance - 24 hour    Equipment Recommendations  3 in 1 bedside commode       Precautions / Restrictions Precautions Precautions: Fall Required Braces or Orthoses: Sling Restrictions Weight Bearing Restrictions: Yes LUE Weight Bearing: Non weight bearing       Mobility Bed Mobility               General bed mobility comments: up in recliner  Transfers Overall transfer level: Needs assistance   Transfers: Sit to/from Stand Sit to Stand: Mod assist (from recliner (lower surface))              Balance Overall balance assessment: Needs assistance Sitting-balance support: No upper extremity supported;Feet supported Sitting balance-Leahy Scale: Good     Standing balance support: Single extremity supported;During functional activity Standing balance-Leahy Scale: Poor Standing balance comment: reliant on RUE on SPC                           ADL either performed or assessed with clinical judgement         Vision Patient Visual Report: No change from baseline            Cognition Arousal/Alertness: Awake/alert Behavior During Therapy: Anxious (about moving and being able to take care of herself) Overall Cognitive Status: Within Functional Limits for tasks assessed                                          Exercises  Pt able to do composite finger flexion/extension and gentle movements at shoulder with S            Pertinent Vitals/ Pain       Pain Assessment: No/denies pain         Frequency  Min 2X/week        Progress Toward Goals  OT Goals(current goals can now be found in the care plan section)  Progress towards OT goals: Progressing toward goals (able to go further this 2nd session)         M-PAC PT "6 Clicks" Daily Activity     Outcome Measure   Help from another person eating meals?: A Little Help from another person taking care of personal grooming?: A Lot Help from another person toileting, which includes using toliet, bedpan, or urinal?: A Lot Help from another person bathing (including washing, rinsing,  drying)?: A Lot Help from another person to put on and taking off regular upper body clothing?: A Lot   6 Click Score: 11    End of Session Equipment Utilized During Treatment: Gait belt (LUE sling)  OT Visit Diagnosis: Unsteadiness on feet (R26.81);History of falling (Z91.81);Other symptoms and signs involving cognitive function;Pain Pain - Right/Left: Left Pain - part of body: Arm   Activity Tolerance Patient tolerated treatment well (better than this morning; however O2 dropping to 85%)   Patient Left in chair;with call bell/phone within reach;with chair alarm set   Nurse Communication  (IV is not taped down, O2 sats on RA at rest and with ambulation as low as 85%)        Time: 1440-1520 OT Time Calculation (min): 40 min  Charges: OT General Charges $OT Visit: 1 Procedure OT Treatments $Self Care/Home Management  : 38-52 mins  Golden Circle, OTR/L [phone] 11/02/2016

## 2016-11-03 ENCOUNTER — Encounter (HOSPITAL_COMMUNITY): Payer: Self-pay | Admitting: Orthopaedic Surgery

## 2016-11-03 LAB — BASIC METABOLIC PANEL
ANION GAP: 8 (ref 5–15)
BUN: 16 mg/dL (ref 6–20)
CHLORIDE: 106 mmol/L (ref 101–111)
CO2: 24 mmol/L (ref 22–32)
Calcium: 8.8 mg/dL — ABNORMAL LOW (ref 8.9–10.3)
Creatinine, Ser: 0.83 mg/dL (ref 0.44–1.00)
GFR calc non Af Amer: >60 mL/min (ref >60)
Glucose, Bld: 137 mg/dL — ABNORMAL HIGH (ref 65–99)
Potassium: 4.7 mmol/L (ref 3.5–5.1)
SODIUM: 138 mmol/L (ref 135–145)

## 2016-11-03 NOTE — OR Nursing (Signed)
Late entry due to delay code documentation. 

## 2016-11-03 NOTE — Progress Notes (Signed)
Patient requiring SNF placement based on OT recs.  Will place order for SW to assist with this.  She is stable from ortho stand point.

## 2016-11-03 NOTE — NC FL2 (Signed)
[place] MEDICAID FL2 LEVEL OF CARE SCREENING TOOL     IDENTIFICATION  Patient Name: Adriana Castro Birthdate: 04-04-1929 Sex: female Admission Date (Current Location): 11/01/2016  [facility] [place] Number:  Herbalist and Address:  [facility], [place] [address] [place], [place] [address]      Provider Number: [id number]  Attending Physician Name and Address:  Leandrew Koyanagi, MD  Relative Name and Phone Number:       Current Level of Care: Hospital Recommended Level of Care: [place] Prior Approval Number:    Date Approved/Denied: 11/03/16 PASRR Number: [id number]  Discharge Plan: SNF    Current Diagnoses: Patient Active Problem List   Diagnosis Date Noted  . History of open reduction and internal fixation (ORIF) procedure 11/01/2016  . Closed bicondylar fracture of distal end of left humerus   . COPD with acute exacerbation ([place]) 02/05/2013  . Hypoxia 02/05/2013  . Chest pain 02/05/2013  . Atrial fibrillation ([place]) 02/05/2013  . Leaky heart valve     Orientation RESPIRATION BLADDER Height & Weight     Self, Time, Situation, Place  Normal Continent Weight: 155 lb (70.3 kg) Height:  5\' 6"  (167.6 cm)  BEHAVIORAL SYMPTOMS/MOOD NEUROLOGICAL BOWEL NUTRITION STATUS      Continent Diet (See DC Summary)  AMBULATORY STATUS COMMUNICATION OF NEEDS Skin   Limited Assist Verbally Surgical wounds (left arm closed Incision with compression wrap)                       Personal Care Assistance Level of Assistance  Bathing, Feeding, Dressing Bathing Assistance: Maximum assistance Feeding assistance: Limited assistance Dressing Assistance: Maximum assistance     Functional Limitations Info  Sight, Hearing, Speech Sight Info: Adequate Hearing Info: Adequate Speech Info: Adequate    SPECIAL CARE FACTORS FREQUENCY  PT (By licensed PT), OT (By licensed OT)     PT Frequency: 5xweek OT Frequency: 5xweek             Contractures      Additional Factors Info  Code Status Code Status Info: Full             Current Medications (11/03/2016):  This is the current hospital active medication list Current Facility-Administered Medications  Medication Dose Route Frequency Provider Last Rate Last Dose  . 0.9 %  sodium chloride infusion   Intravenous Continuous Leandrew Koyanagi, MD 125 mL/hr at 11/01/16 1856    . acetaminophen (TYLENOL) tablet 650 mg  650 mg Oral Q6H PRN Leandrew Koyanagi, MD       Or  . acetaminophen (TYLENOL) suppository 650 mg  650 mg Rectal Q6H PRN Leandrew Koyanagi, MD      . amiodarone (PACERONE) tablet 100 mg  100 mg Oral BID Leandrew Koyanagi, MD   100 mg at 11/03/16 0940  . diltiazem (CARDIZEM CD) 24 hr capsule 120 mg  120 mg Oral Daily Leandrew Koyanagi, MD   120 mg at 11/03/16 0940  . diphenhydrAMINE (BENADRYL) 12.5 MG/5ML elixir 25 mg  25 mg Oral Q4H PRN Leandrew Koyanagi, MD      . fluticasone (FLONASE) 50 MCG/ACT nasal spray 1 spray  1 spray Each Nare Daily PRN Leandrew Koyanagi, MD      . guaiFENesin (MUCINEX) 12 hr tablet 1,200 mg  1,200 mg Oral BID Leandrew Koyanagi, MD   1,200 mg at 11/03/16 0940  . ipratropium (ATROVENT) nebulizer solution  500 mcg  500 mcg Nebulization BID Leandrew Koyanagi, MD   500 mcg at 11/03/16 0940  . lactated ringers infusion   Intravenous Continuous Rica Koyanagi, MD 10 mL/hr at 11/01/16 1225    . levalbuterol (XOPENEX) nebulizer solution 0.63 mg  0.63 mg Nebulization Q4H PRN Leandrew Koyanagi, MD      . levofloxacin [facility]) tablet 750 mg  750 mg Oral Q48H Leandrew Koyanagi, MD   750 mg at 11/01/16 2017  . magnesium citrate solution 1 Bottle  1 Bottle Oral Once PRN Leandrew Koyanagi, MD      . methocarbamol (ROBAXIN) tablet 500 mg  500 mg Oral Q6H PRN Leandrew Koyanagi, MD   500 mg at 11/02/16 2101   Or  . methocarbamol (ROBAXIN) 500 mg in dextrose 5 % 50 mL IVPB  500 mg Intravenous Q6H PRN Leandrew Koyanagi, MD      . metoCLOPramide (REGLAN) tablet 5-10 mg  5-10 mg Oral Q8H PRN Leandrew Koyanagi,  MD       Or  . metoCLOPramide (REGLAN) injection 5-10 mg  5-10 mg Intravenous Q8H PRN Leandrew Koyanagi, MD      . montelukast (SINGULAIR) tablet 10 mg  10 mg Oral QPM Leandrew Koyanagi, MD   10 mg at 11/02/16 1902  . morphine 4 MG/ML injection 1 mg  1 mg Intravenous Q2H PRN Leandrew Koyanagi, MD      . multivitamin with minerals tablet 1 tablet  1 tablet Oral Daily Leandrew Koyanagi, MD   1 tablet at 11/03/16 0941  . nitroGLYCERIN (NITROSTAT) SL tablet 0.4 mg  0.4 mg Sublingual Q5 min PRN Leandrew Koyanagi, MD      . ondansetron [facility]) tablet 4 mg  4 mg Oral Q6H PRN Leandrew Koyanagi, MD       Or  . ondansetron [facility]) injection 4 mg  4 mg Intravenous Q6H PRN Leandrew Koyanagi, MD      . oxyCODONE (Oxy IR/ROXICODONE) immediate release tablet 5-15 mg  5-15 mg Oral Q3H PRN Leandrew Koyanagi, MD      . oxyCODONE-acetaminophen (PERCOCET/ROXICET) 5-325 MG per tablet 1 tablet  1 tablet Oral Q8H PRN Leandrew Koyanagi, MD   1 tablet at 11/02/16 2055  . polyethylene glycol (MIRALAX / GLYCOLAX) packet 17 g  17 g Oral Daily PRN Leandrew Koyanagi, MD   17 g at 11/01/16 2214  . [START ON 11/04/2016] predniSONE (DELTASONE) tablet 5 mg  5 mg Oral Q breakfast Leandrew Koyanagi, MD       Followed by  . [START ON 11/05/2016] predniSONE (DELTASONE) tablet 4 mg  4 mg Oral Q breakfast Leandrew Koyanagi, MD       Followed by  . [START ON 11/06/2016] predniSONE (DELTASONE) tablet 3 mg  3 mg Oral Q breakfast Leandrew Koyanagi, MD       Followed by  . [START ON 11/07/2016] predniSONE (DELTASONE) tablet 2 mg  2 mg Oral Q breakfast Leandrew Koyanagi, MD       Followed by  . [START ON 11/08/2016] predniSONE (DELTASONE) tablet 1 mg  1 mg Oral Q breakfast Leandrew Koyanagi, MD      . Rivaroxaban [facility]) tablet 15 mg  15 mg Oral QPM Leandrew Koyanagi, MD   15 mg at 11/02/16 1902  . sertraline (ZOLOFT) tablet 100 mg  100 mg Oral QHS Leandrew Koyanagi, MD   100 mg at 11/02/16 2101  .  simvastatin (ZOCOR) tablet 10 mg  10 mg Oral QPM Leandrew Koyanagi, MD   10 mg at 11/02/16 1902  . sorbitol 70 %  solution 30 mL  30 mL Oral Daily PRN Leandrew Koyanagi, MD      . tiotropium [facility]) inhalation capsule 18 mcg  18 mcg Inhalation Daily PRN Leandrew Koyanagi, MD      . vitamin E capsule 800 Units  800 Units Oral Daily Leandrew Koyanagi, MD   800 Units at 11/03/16 0941  . zinc sulfate capsule 220 mg  220 mg Oral Daily Leandrew Koyanagi, MD   220 mg at 11/03/16 0940     Discharge Medications: Please see discharge summary for a list of discharge medications.  Relevant Imaging Results:  Relevant Lab Results:   Additional Information [id number]  Normajean Baxter, LCSW

## 2016-11-03 NOTE — H&P (Signed)
PREOPERATIVE H&P  Chief Complaint: Left supracondylar distal humerus and medial humeral epicondyle fracture  HPI: Adriana Castro is a 83 y.o. female who presents for surgical treatment of Left supracondylar distal humerus and medial humeral epicondyle fracture.  She denies any changes in medical history.  Past Medical History:  Diagnosis Date  . Anxiety   . Arthritis   . COPD (chronic obstructive pulmonary disease) ([place])   . Dysrhythmia    ATRIAL FIBRILATION  . Heart murmur   . Irregular heart beat   . Leaky heart valve   . Shortness of breath    Past Surgical History:  Procedure Laterality Date  . ORIF HUMERUS FRACTURE Left 11/01/2016   Procedure: OPEN REDUCTION INTERNAL FIXATION (ORIF) LEFT SUPRACONDYLAR HUMERUS AND MEDIAL EPICONDYLE;  Surgeon: Leandrew Koyanagi, MD;  Location: [facility];  Service: Orthopedics;  Laterality: Left;  . SHOULDER SURGERY Right    ?   2002     . SKIN CANCER EXCISION     X 5  . TONSILLECTOMY     Social History   Social History  . Marital status: Widowed    Spouse name: N/A  . Number of children: N/A  . Years of education: N/A   Social History Main Topics  . Smoking status: Former Smoker    Quit date: 06/07/2012  . Smokeless tobacco: Never Used  . Alcohol use No  . Drug use: No  . Sexual activity: Not Asked   Other Topics Concern  . None   Social History Narrative  . None   Family History  Problem Relation Age of Onset  . Diabetes Mother   . Asthma Father    Allergies  Allergen Reactions  . Albuterol Nausea And Vomiting   Prior to Admission medications   Medication Sig Start Date End Date Taking? Authorizing Provider  amiodarone (PACERONE) 200 MG tablet Take 100 mg by mouth 2 (two) times daily. 09/12/16  Yes [provider]  diltiazem (CARDIZEM) 120 MG tablet Take 120 mg by mouth daily.   Yes [provider]  fluticasone (FLONASE) 50 MCG/ACT nasal spray Place 1 spray into both nostrils daily as needed for  allergies. 11/22/15  Yes [provider]  ipratropium (ATROVENT) 0.02 % nebulizer solution Take 500 mcg by nebulization 2 (two) times daily.    Yes [provider]  levalbuterol (XOPENEX HFA) 45 MCG/ACT inhaler Inhale 1-2 puffs into the lungs every 4 (four) hours as needed for wheezing or shortness of breath.   Yes [provider]  levofloxacin (LEVAQUIN) 250 MG tablet Take 3 tablets (750 mg total) by mouth every other day. 02/08/13  Yes Verlee Monte, MD  montelukast (SINGULAIR) 10 MG tablet Take 10 mg by mouth every evening. 10/09/16  Yes [provider]  Multiple Vitamin (MULTI-VITAMINS) TABS Take 1 tablet by mouth daily.   Yes [provider]  nitroGLYCERIN (NITROSTAT) 0.4 MG SL tablet Place 0.4 mg under the tongue every 5 (five) minutes as needed. 10/23/16  Yes [provider]  omeprazole (PRILOSEC) 20 MG capsule Take 20 mg by mouth every morning. 08/15/16  Yes [provider]  oxyCODONE-acetaminophen (PERCOCET/ROXICET) 5-325 MG tablet Take 1 tablet by mouth every 8 (eight) hours as needed for severe pain. 10/30/16  Yes Heriberto Antigua, MD  sertraline (ZOLOFT) 100 MG tablet Take 100 mg by mouth at bedtime. 10/02/16  Yes [provider]  simvastatin (ZOCOR) 10 MG tablet Take 10 mg by mouth every evening. 08/20/16  Yes [provider]  tiotropium (SPIRIVA) 18 MCG inhalation capsule Place 18 mcg into inhaler and inhale daily as needed (wheezing).    Yes [provider]  Vitamin D, Ergocalciferol, (DRISDOL) 50000 units CAPS capsule Take 50,000 Units by mouth as needed. supplement 10/23/16  Yes [provider]  vitamin E 400 UNIT capsule Take 800 Units by mouth daily.   Yes [provider]  guaiFENesin (MUCINEX) 600 MG 12 hr tablet Take 2 tablets (1,200 mg total) by mouth 2 (two) times daily. Patient not taking: Reported on 10/30/2016 02/08/13   Verlee Monte, MD  levalbuterol Penne Lash) 0.63 MG/3ML nebulizer  solution Take 3 mLs by nebulization every 4 (four) hours as needed for wheezing.    [provider]  ondansetron (ZOFRAN) 4 MG tablet Take 1-2 tablets (4-8 mg total) by mouth every 8 (eight) hours as needed for nausea or vomiting. 11/01/16   Leandrew Koyanagi, MD  oxyCODONE (OXY IR/ROXICODONE) 5 MG immediate release tablet Take 1-3 tablets (5-15 mg total) by mouth every 4 (four) hours as needed. 11/01/16   Leandrew Koyanagi, MD  predniSONE (DELTASONE) 10 MG tablet Take 6-5-4-3-2-1 PO daily till gone Patient not taking: Reported on 10/30/2016 02/08/13   Verlee Monte, MD  XARELTO 15 MG TABS tablet Take 15 mg by mouth every evening.  10/10/16   [provider]  zinc sulfate 220 (50 Zn) MG capsule Take 1 capsule (220 mg total) by mouth daily. 11/01/16   Leandrew Koyanagi, MD     Positive ROS: All other systems have been reviewed and were otherwise negative with the exception of those mentioned in the HPI and as above.  Physical Exam: General: Alert, no acute distress Cardiovascular: No pedal edema Respiratory: No cyanosis, no use of accessory musculature GI: abdomen soft Skin: No lesions in the area of chief complaint Neurologic: Sensation intact distally Psychiatric: Patient is competent for consent with normal mood and affect Lymphatic: no lymphedema  MUSCULOSKELETAL: exam stable  Assessment: Left supracondylar distal humerus and medial humeral epicondyle fracture  Plan: Plan for Procedure(s): OPEN REDUCTION INTERNAL FIXATION (ORIF) LEFT SUPRACONDYLAR HUMERUS AND MEDIAL EPICONDYLE  The risks benefits and alternatives were discussed with the patient including but not limited to the risks of nonoperative treatment, versus surgical intervention including infection, bleeding, nerve injury,  blood clots, cardiopulmonary complications, morbidity, mortality, among others, and they were willing to proceed.   Eduard Roux, MD   11/03/2016 7:42 AM'

## 2016-11-03 NOTE — Progress Notes (Signed)
Occupational Therapy Treatment Patient Details Name: Adriana Castro MRN: [id number] DOB: 07/25/1928 Today's Date: 11/03/2016    History of present illness Pt s/p fall with LUE fxs and now ORIF of left supracondylar humerus fracture; ORIF of left medial humeral epicondyle fracture Subcutaneous transposition of ulnar nerve. PHMx: COPD, anxiety, SOB   OT comments  This 83 yo female admitted with above presents to acute OT making progress with bed mobility and toileting. She will continue to benefit from acute OT with follow up OT at SNF to get to a S-Mod I level to go home with sister or possibly by herself.  Follow Up Recommendations  SNF;Supervision/Assistance - 24 hour    Equipment Recommendations  3 in 1 bedside commode       Precautions / Restrictions Precautions Precautions: Fall Required Braces or Orthoses: Sling Restrictions Weight Bearing Restrictions: Yes LUE Weight Bearing: Non weight bearing       Mobility Bed Mobility Overal bed mobility: Needs Assistance Bed Mobility: Sit to Supine       Sit to supine: Min assist (HOB flat and no rail)      Transfers Overall transfer level: Needs assistance Equipment used: Straight cane Transfers: Sit to/from Stand Sit to Stand: Min assist (from 3n1)              Balance Overall balance assessment: Needs assistance Sitting-balance support: No upper extremity supported;Feet supported Sitting balance-Leahy Scale: Good     Standing balance support: Single extremity supported;During functional activity Standing balance-Leahy Scale: Poor Standing balance comment: reliant on RUE on SPC                           ADL either performed or assessed with clinical judgement   ADL Overall ADL's : Needs assistance/impaired                         Toilet Transfer: Minimal assistance;Ambulation   Toileting- Clothing Manipulation and Hygiene: Moderate assistance (min A sit<>stand and standing balance)               Vision Patient Visual Report: No change from baseline            Cognition Arousal/Alertness: Awake/alert Behavior During Therapy: WFL for tasks assessed/performed Overall Cognitive Status: Within Functional Limits for tasks assessed                                                     Pertinent Vitals/ Pain       Pain Assessment: Faces Faces Pain Scale: Hurts little more Pain Location: RUE Pain Descriptors / Indicators: Grimacing;Moaning (with movement) Pain Intervention(s): Limited activity within patient's tolerance;Repositioned         Frequency  Min 2X/week        Progress Toward Goals  OT Goals(current goals can now be found in the care plan section)   progressing     Plan Discharge plan remains appropriate       AM-PAC PT "6 Clicks" Daily Activity     Outcome Measure   Help from another person eating meals?: A Little Help from another person taking care of personal grooming?: A Lot Help from another person toileting, which includes using toliet, bedpan, or urinal?: A Lot Help from another person bathing (including washing, rinsing,  drying)?: A Lot Help from another person to put on and taking off regular upper body clothing?: A Lot Help from another person to put on and taking off regular lower body clothing?: Total 6 Click Score: 12    End of Session Equipment Utilized During Treatment: Gait belt (LUE sling)  OT Visit Diagnosis: Unsteadiness on feet (R26.81);History of falling (Z91.81);Pain Pain - Right/Left: Right Pain - part of body: Arm   Activity Tolerance Patient tolerated treatment well   Patient Left in bed;with call bell/phone within reach;with bed alarm set   Nurse Communication  (Pt's O2 sats continue to drop in 80's on RA; had pt do 10 reps of inspirometer and 10 reps of purse lipped breathing)        Time: 3428-7681 OT Time Calculation (min): 14 min  Charges: OT General Charges $OT Visit: 1  Procedure OT Treatments $Self Care/Home Management : 8-22 mins  Golden Circle, OTR/L [phone] 11/03/2016

## 2016-11-03 NOTE — Clinical Social Work Note (Signed)
Clinical Social Work Assessment  Patient Details  Name: Adriana Castro MRN: [id number] Date of Birth: 1929-03-22  Date of referral:  11/03/16               Reason for consult:  Facility Placement                Permission sought to share information with:  Facility Art therapist granted to share information::  Yes, Verbal Permission Granted  Name::     sister and brother n Catering manager::  SNF  Relationship::     Contact Information:     Housing/Transportation Living arrangements for the past 2 months:  Single Family Home Source of Information:  Patient, Other (Comment Required) (Family) Patient Interpreter Needed:  None Criminal Activity/Legal Involvement Pertinent to Current Situation/Hospitalization:  No - Comment as needed Significant Relationships:  Siblings, Other Family Members Lives with:  Relatives Do you feel safe going back to the place where you live?  No Need for family participation in patient care:  Yes (Comment)  Care giving concerns:  Patient used a walker and cane prior to hospitalization. She resides with family however cannot be cared for safely at home. Patient will need to go to SNF as she is unsafe to return home at this time.  Social Worker assessment / plan:  CSW discussed clinical team recommendations for DC to SNF. CSW introduced self and discussed SNF options/placement. Family familiar with SNF placement and had experience with SNF in the past. Family gave permission to send offers to facility.  Family indicated that their first choice is [facility]. CSW will f/u on same.  FL2 and passr obtained. Offers sent.  Employment status:  Retired Forensic scientist:  Medicare PT Recommendations:  [place] / Referral to community resources:     Patient/Family's Response to care:  Psychologist, prison and probation services of support with SNF placement. No issues or concerns.  Patient/Family's Understanding of and Emotional  Response to Diagnosis, Current Treatment, and Prognosis:  Patient/family has good understanding of diagnosis, current treatment and prognosis. Family hopeful that rehabilitation will address impairment. No issues or concerns indicated at this time.  Emotional Assessment Appearance:  Appears stated age Attitude/Demeanor/Rapport:   (Cooperative) Affect (typically observed):  Accepting, Appropriate Orientation:  Oriented to Self, Oriented to Place, Oriented to  Time, Oriented to Situation Alcohol / Substance use:  Not Applicable Psych involvement (Current and /or in the community):  No (Comment)  Discharge Needs  Concerns to be addressed:  Care Coordination Readmission within the last 30 days:  No Current discharge risk:  Dependent with Mobility, Physical Impairment Barriers to Discharge:  No Barriers Identified   Normajean Baxter, LCSW 11/03/2016, 3:50 PM

## 2016-11-03 NOTE — Social Work (Addendum)
Facility offered bed and patient accepted [facility].  Pt will DC to facility when medically ready.

## 2016-11-03 NOTE — Evaluation (Signed)
Physical Therapy Evaluation Patient Details Name: Adriana Castro MRN: [id number] DOB: Apr 01, 1929 Today's Date: 11/03/2016   History of Present Illness  Pt s/p fall with LUE fxs and now ORIF of left supracondylar humerus fracture; ORIF of left medial humeral epicondyle fracture Subcutaneous transposition of ulnar nerve. PHMx: COPD, anxiety, SOB  Clinical Impression  Pt admitted as above and presenting with functional mobility limitations 2* non-use of L UE, post op pain, generalized weakness and balance deficits.  Pt would benefit from follow up rehab at SNF level to maximize IND and safety.    Follow Up Recommendations SNF    Equipment Recommendations  None recommended by PT    Recommendations for Other Services OT consult     Precautions / Restrictions Precautions Precautions: Fall Required Braces or Orthoses: Sling Restrictions Weight Bearing Restrictions: Yes LUE Weight Bearing: Non weight bearing      Mobility  Bed Mobility Overal bed mobility: Needs Assistance Bed Mobility: Sit to Supine       Sit to supine: Min assist (HOB flat and no rail)   General bed mobility comments: up in recliner and in bathroom with OT at end of session  Transfers Overall transfer level: Needs assistance Equipment used: Straight cane Transfers: Sit to/from Stand Sit to Stand: Mod assist;Min assist         General transfer comment: Mod assist from lower bench, min assist from arm chair.  Cues for transition position and use of UEs to self assist  Ambulation/Gait Ambulation/Gait assistance: Min assist Ambulation Distance (Feet): 75 Feet (twice) Assistive device: Straight cane Gait Pattern/deviations: Step-through pattern;Decreased step length - right;Decreased step length - left;Shuffle;Trunk flexed;Drifts right/left Gait velocity: decr Gait velocity interpretation: Below normal speed for age/gender General Gait Details: cues for posture and cane placement: min assist for balance,  increased time with multiple standing rests  Stairs            Wheelchair Mobility    Modified Rankin (Stroke Patients Only)       Balance Overall balance assessment: Needs assistance Sitting-balance support: No upper extremity supported;Feet supported Sitting balance-Leahy Scale: Good     Standing balance support: Single extremity supported Standing balance-Leahy Scale: Poor Standing balance comment: reliant on RUE on SPC                             Pertinent Vitals/Pain Pain Assessment: Faces Faces Pain Scale: Hurts little more Pain Location: RUE Pain Descriptors / Indicators: Grimacing;Guarding Pain Intervention(s): Limited activity within patient's tolerance;Monitored during session    Home Living Family/patient expects to be discharged to:: Skilled nursing facility Living Arrangements: Alone   Type of Home: House Home Access: Stairs to enter Entrance Stairs-Rails: Psychiatric nurse of Steps: 1 and 1 Home Layout: One level Home Equipment: Cane - single point      Prior Function Level of Independence: Independent               Hand Dominance   Dominant Hand: Right    Extremity/Trunk Assessment   Upper Extremity Assessment Upper Extremity Assessment: Defer to OT evaluation    Lower Extremity Assessment Lower Extremity Assessment: Generalized weakness    Cervical / Trunk Assessment Cervical / Trunk Assessment: Kyphotic  Communication   Communication: No difficulties  Cognition Arousal/Alertness: Awake/alert Behavior During Therapy: WFL for tasks assessed/performed Overall Cognitive Status: Within Functional Limits for tasks assessed  General Comments: Pt stated she had been here ever since she fell on 10/30/16; per chart she was D/C'd home and came back in for sx yesterday (11/01/16); pt was aware of where she is, the year, and why she is here      General Comments       Exercises     Assessment/Plan    PT Assessment Patient needs continued PT services  PT Problem List Decreased strength;Decreased activity tolerance;Decreased mobility;Decreased balance;Decreased knowledge of use of DME;Pain       PT Treatment Interventions DME instruction;Gait training;Functional mobility training;Therapeutic activities;Therapeutic exercise;Balance training;Patient/family education    PT Goals (Current goals can be found in the Care Plan section)  Acute Rehab PT Goals Patient Stated Goal: to go to rehab then home PT Goal Formulation: With patient Time For Goal Achievement: 11/18/16 Potential to Achieve Goals: Good    Frequency Min 3X/week   Barriers to discharge Decreased caregiver support Pt lives alone.    Co-evaluation               AM-PAC PT "6 Clicks" Daily Activity  Outcome Measure Difficulty turning over in bed (including adjusting bedclothes, sheets and blankets)?: A Lot Difficulty moving from lying on back to sitting on the side of the bed? : A Lot Difficulty sitting down on and standing up from a chair with arms (e.g., wheelchair, bedside commode, etc,.)?: A Lot Help needed moving to and from a bed to chair (including a wheelchair)?: A Lot Help needed walking in hospital room?: A Little Help needed climbing 3-5 steps with a railing? : A Lot 6 Click Score: 13    End of Session Equipment Utilized During Treatment: Gait belt;Oxygen Activity Tolerance: Patient limited by fatigue;Patient tolerated treatment well Patient left: Other (comment) (bathroom with OT) Nurse Communication: Mobility status PT Visit Diagnosis: Unsteadiness on feet (R26.81);History of falling (Z91.81)    Time: 0141-0301 PT Time Calculation (min) (ACUTE ONLY): 26 min   Charges:   PT Evaluation $PT Eval Low Complexity: 1 Procedure PT Treatments $Gait Training: 8-22 mins   PT G Codes:        Pg [phone]   Adriana Castro 11/03/2016, 12:26 PM

## 2016-11-04 DIAGNOSIS — I499 Cardiac arrhythmia, unspecified: Secondary | ICD-10-CM | POA: Diagnosis not present

## 2016-11-04 DIAGNOSIS — R011 Cardiac murmur, unspecified: Secondary | ICD-10-CM | POA: Diagnosis not present

## 2016-11-04 DIAGNOSIS — R4182 Altered mental status, unspecified: Secondary | ICD-10-CM | POA: Diagnosis not present

## 2016-11-04 DIAGNOSIS — R0602 Shortness of breath: Secondary | ICD-10-CM | POA: Diagnosis not present

## 2016-11-04 DIAGNOSIS — S42472D Displaced transcondylar fracture of left humerus, subsequent encounter for fracture with routine healing: Secondary | ICD-10-CM | POA: Diagnosis not present

## 2016-11-04 DIAGNOSIS — G8911 Acute pain due to trauma: Secondary | ICD-10-CM | POA: Diagnosis not present

## 2016-11-04 DIAGNOSIS — S42402D Unspecified fracture of lower end of left humerus, subsequent encounter for fracture with routine healing: Secondary | ICD-10-CM | POA: Diagnosis not present

## 2016-11-04 DIAGNOSIS — J449 Chronic obstructive pulmonary disease, unspecified: Secondary | ICD-10-CM | POA: Diagnosis not present

## 2016-11-04 DIAGNOSIS — M1990 Unspecified osteoarthritis, unspecified site: Secondary | ICD-10-CM | POA: Diagnosis not present

## 2016-11-04 DIAGNOSIS — G8918 Other acute postprocedural pain: Secondary | ICD-10-CM | POA: Diagnosis not present

## 2016-11-04 DIAGNOSIS — D649 Anemia, unspecified: Secondary | ICD-10-CM | POA: Diagnosis not present

## 2016-11-04 DIAGNOSIS — F419 Anxiety disorder, unspecified: Secondary | ICD-10-CM | POA: Diagnosis not present

## 2016-11-04 DIAGNOSIS — S42492D Other displaced fracture of lower end of left humerus, subsequent encounter for fracture with routine healing: Secondary | ICD-10-CM | POA: Diagnosis not present

## 2016-11-04 DIAGNOSIS — S5290XA Unspecified fracture of unspecified forearm, initial encounter for closed fracture: Secondary | ICD-10-CM | POA: Diagnosis not present

## 2016-11-04 DIAGNOSIS — M439 Deforming dorsopathy, unspecified: Secondary | ICD-10-CM | POA: Diagnosis not present

## 2016-11-04 DIAGNOSIS — T82223D Leakage of biological heart valve graft, subsequent encounter: Secondary | ICD-10-CM | POA: Diagnosis not present

## 2016-11-04 LAB — BASIC METABOLIC PANEL
Anion gap: 9 (ref 5–15)
BUN: 16 mg/dL (ref 6–20)
CHLORIDE: 104 mmol/L (ref 101–111)
CO2: 26 mmol/L (ref 22–32)
CREATININE: 0.87 mg/dL (ref 0.44–1.00)
Calcium: 8.5 mg/dL — ABNORMAL LOW (ref 8.9–10.3)
GFR calc Af Amer: >60 mL/min (ref >60)
GFR, EST NON AFRICAN AMERICAN: 59 mL/min — AB (ref >60)
Glucose, Bld: 90 mg/dL (ref 65–99)
Potassium: 4.3 mmol/L (ref 3.5–5.1)
SODIUM: 139 mmol/L (ref 135–145)

## 2016-11-04 NOTE — Progress Notes (Signed)
Subjective: Patient stable.  Pain well controlled.   Objective: Vital signs in last 24 hours: Temp:  [98.1 F (36.7 C)-98.7 F (37.1 C)] 98.3 F (36.8 C) (06/02 0455) Pulse Rate:  [66-77] 66 (06/02 0455) Resp:  [18-20] 18 (06/02 0455) BP: (121-131)/(43-50) 131/50 (06/02 0455) SpO2:  [86 %-97 %] 97 % (06/02 0455)  Intake/Output from previous day: 06/01 0701 - 06/02 0700 In: 480 [P.O.:480] Out: -  Intake/Output this shift: Total I/O In: 120 [P.O.:120] Out: -   Exam:  Intact pulses distally  Labs: No results for input(s): HGB in the last 72 hours. No results for input(s): WBC, RBC, HCT, PLT in the last 72 hours.  Recent Labs  11/03/16 1229 11/04/16 0645  NA 138 139  K 4.7 4.3  CL 106 104  CO2 24 26  BUN 16 16  CREATININE 0.83 0.87  GLUCOSE 137* 90  CALCIUM 8.8* 8.5*   No results for input(s): LABPT, INR in the last 72 hours.  Assessment/Plan: Plan at this time is for discharge to skilled nursing when she is medically ready.  This may be today or tomorrow.  She's currently sitting in her chair and appears to be doing well   G Alphonzo Severance 11/04/2016, 9:30 AM

## 2016-11-04 NOTE — Clinical Social Work Placement (Signed)
   CLINICAL SOCIAL WORK PLACEMENT  NOTE  Date:  11/04/2016  Patient Details  Name: Adriana Castro MRN: [id number] Date of Birth: [birth year]  Clinical Social Work is seeking post-discharge placement for this patient at the [place] level of care (*CSW will initial, date and re-position this form in  chart as items are completed):      Patient/family provided with [facility] Work Department's list of facilities offering this level of care within the geographic area requested by the patient (or if unable, by the patient's family).      Patient/family informed of their freedom to choose among providers that offer the needed level of care, that participate in Medicare, Medicaid or managed care program needed by the patient, have an available bed and are willing to accept the patient.      Patient/family informed of [facility]'s ownership interest in [facility] and [facility], as well as of the fact that they are under no obligation to receive care at these facilities.  PASRR submitted to EDS on 11/03/16     PASRR number received on 11/03/16     Existing PASRR number confirmed on       FL2 transmitted to all facilities in geographic area requested by pt/family on 11/04/16     FL2 transmitted to all facilities within larger geographic area on       Patient informed that his/her managed care company has contracts with or will negotiate with certain facilities, including the following:        Yes   Patient/family informed of bed offers received.  Patient chooses bed at [place]     Physician recommends and patient chooses bed at      Patient to be transferred to [place] on 11/04/16.  Patient to be transferred to facility by [facility]     Patient family notified on 11/04/16 of transfer.  Name of family member notified:  Rene Paci     PHYSICIAN Please prepare prescriptions     Additional Comment:  Pt is ready for  discharge today and will go to [facility]. Pt and BIL are aware and agreeable to discharge plan. CSW sent clinicals to [facility] and confirmed bed offer with Lattie [facility] ([phone]). Clinicals sent to facility. RN communicated with facility for room and report. Transportation arranged with [facility]. CSW is signing off as no further needs identified.  _______________________________________________ Truitt Merle, LCSW 11/04/2016, 4:37 PM

## 2016-11-04 NOTE — Progress Notes (Signed)
Occupational Therapy Treatment Patient Details Name: Adriana Castro MRN: [id number] DOB: 1928-08-19 Today's Date: 11/04/2016    History of present illness Pt s/p fall with LUE fxs and now ORIF of left supracondylar humerus fracture; ORIF of left medial humeral epicondyle fracture Subcutaneous transposition of ulnar nerve. PHMx: COPD, anxiety, SOB   OT comments  Focus of skilled OT session was continued exercise and ROM of L digits and shoulder.  Pt. Able to return demo but was limited this day by fatigue.  Continued to fall asleep during instruction.  Will continue to follow acutely for safety and carryover of therapeutic interventions.    Follow Up Recommendations  SNF;Supervision/Assistance - 24 hour    Equipment Recommendations  3 in 1 bedside commode    Recommendations for Other Services      Precautions / Restrictions Precautions Precautions: Fall Required Braces or Orthoses: Sling Restrictions Weight Bearing Restrictions: Yes LUE Weight Bearing: Non weight bearing       Mobility Bed Mobility                  Transfers                      Balance                                           ADL either performed or assessed with clinical judgement   ADL                                               Vision       Perception     Praxis      Cognition                                                Exercises Other Exercises Other Exercises: gentle composite exercises and rom for edema managment and encouragement of functional use Other Exercises: slow, gentle shoulder rom   Shoulder Instructions       General Comments      Pertinent Vitals/ Pain          Home Living                                          Prior Functioning/Environment              Frequency  Min 2X/week        Progress Toward Goals  OT Goals(current goals can now be found in the  care plan section)  Progress towards OT goals: Progressing toward goals     Plan Discharge plan remains appropriate    Co-evaluation                 AM-PAC PT "6 Clicks" Daily Activity     Outcome Measure   Help from another person eating meals?: A Little Help from another person taking care of personal grooming?: A Lot Help from another person toileting, which includes using toliet, bedpan, or urinal?: A Lot Help from  another person bathing (including washing, rinsing, drying)?: A Lot Help from another person to put on and taking off regular upper body clothing?: A Lot Help from another person to put on and taking off regular lower body clothing?: Total 6 Click Score: 12    End of Session    OT Visit Diagnosis: Unsteadiness on feet (R26.81);History of falling (Z91.81);Pain Pain - Right/Left: Right Pain - part of body: Arm   Activity Tolerance Patient tolerated treatment well   Patient Left in bed;with call bell/phone within reach;with bed alarm set   Nurse Communication          Time: 1041-1100 OT Time Calculation (min): 19 min  Charges: OT General Charges $OT Visit: 1 Procedure OT Treatments $Therapeutic Exercise: 8-22 mins   Janice Coffin, COTA/L 11/04/2016, 11:04 AM

## 2016-11-06 ENCOUNTER — Telehealth (INDEPENDENT_AMBULATORY_CARE_PROVIDER_SITE_OTHER): Payer: Self-pay | Admitting: Orthopaedic Surgery

## 2016-11-06 NOTE — Telephone Encounter (Signed)
Non weight bearing

## 2016-11-06 NOTE — Telephone Encounter (Signed)
Faxed to [phone]

## 2016-11-06 NOTE — Telephone Encounter (Signed)
WB STATUS?

## 2016-11-06 NOTE — Telephone Encounter (Signed)
Leda Roys from [facility] called asking about the weight baring status to be faxed over for the patient. Fax # [phone]

## 2016-11-09 DIAGNOSIS — G8918 Other acute postprocedural pain: Secondary | ICD-10-CM | POA: Diagnosis not present

## 2016-11-09 DIAGNOSIS — D649 Anemia, unspecified: Secondary | ICD-10-CM | POA: Diagnosis not present

## 2016-11-09 DIAGNOSIS — M439 Deforming dorsopathy, unspecified: Secondary | ICD-10-CM | POA: Diagnosis not present

## 2016-11-09 DIAGNOSIS — S42472D Displaced transcondylar fracture of left humerus, subsequent encounter for fracture with routine healing: Secondary | ICD-10-CM | POA: Diagnosis not present

## 2016-11-14 ENCOUNTER — Ambulatory Visit (INDEPENDENT_AMBULATORY_CARE_PROVIDER_SITE_OTHER): Payer: Medicare Other

## 2016-11-14 ENCOUNTER — Ambulatory Visit (INDEPENDENT_AMBULATORY_CARE_PROVIDER_SITE_OTHER): Payer: Medicare Other | Admitting: Orthopaedic Surgery

## 2016-11-14 ENCOUNTER — Encounter (INDEPENDENT_AMBULATORY_CARE_PROVIDER_SITE_OTHER): Payer: Self-pay | Admitting: Orthopaedic Surgery

## 2016-11-14 DIAGNOSIS — S42492D Other displaced fracture of lower end of left humerus, subsequent encounter for fracture with routine healing: Secondary | ICD-10-CM

## 2016-11-14 NOTE — Progress Notes (Signed)
Patient is 2 weeks status post ORIF left distal humerus fracture. [facility]. She is doing well. Her pain is controlled. The incision is healed. She is neurovascular intact. Her ulnar nerve is working normally. She denies any numbness. The sutures were removed. At this point begin gentle range of motion for joint mobilization. Continue nonweightbearing. Sling at all times other than when in therapy. I'll see her back in 4 weeks for recheck with 2 view x-rays of the left elbow.

## 2016-11-25 DIAGNOSIS — K5900 Constipation, unspecified: Secondary | ICD-10-CM | POA: Diagnosis not present

## 2016-11-25 DIAGNOSIS — M80022D Age-related osteoporosis with current pathological fracture, left humerus, subsequent encounter for fracture with routine healing: Secondary | ICD-10-CM | POA: Diagnosis not present

## 2016-11-25 DIAGNOSIS — F418 Other specified anxiety disorders: Secondary | ICD-10-CM | POA: Diagnosis not present

## 2016-11-25 DIAGNOSIS — I10 Essential (primary) hypertension: Secondary | ICD-10-CM | POA: Diagnosis not present

## 2016-11-25 DIAGNOSIS — Z9181 History of falling: Secondary | ICD-10-CM | POA: Diagnosis not present

## 2016-11-25 DIAGNOSIS — I4891 Unspecified atrial fibrillation: Secondary | ICD-10-CM | POA: Diagnosis not present

## 2016-11-25 DIAGNOSIS — Z85828 Personal history of other malignant neoplasm of skin: Secondary | ICD-10-CM | POA: Diagnosis not present

## 2016-11-25 DIAGNOSIS — Z7951 Long term (current) use of inhaled steroids: Secondary | ICD-10-CM | POA: Diagnosis not present

## 2016-11-25 DIAGNOSIS — D649 Anemia, unspecified: Secondary | ICD-10-CM | POA: Diagnosis not present

## 2016-11-25 DIAGNOSIS — Z87891 Personal history of nicotine dependence: Secondary | ICD-10-CM | POA: Diagnosis not present

## 2016-11-25 DIAGNOSIS — W19XXXD Unspecified fall, subsequent encounter: Secondary | ICD-10-CM | POA: Diagnosis not present

## 2016-11-25 DIAGNOSIS — Z8543 Personal history of malignant neoplasm of ovary: Secondary | ICD-10-CM | POA: Diagnosis not present

## 2016-11-25 DIAGNOSIS — Z7901 Long term (current) use of anticoagulants: Secondary | ICD-10-CM | POA: Diagnosis not present

## 2016-11-25 DIAGNOSIS — J449 Chronic obstructive pulmonary disease, unspecified: Secondary | ICD-10-CM | POA: Diagnosis not present

## 2016-11-25 DIAGNOSIS — M1991 Primary osteoarthritis, unspecified site: Secondary | ICD-10-CM | POA: Diagnosis not present

## 2016-11-27 ENCOUNTER — Telehealth (INDEPENDENT_AMBULATORY_CARE_PROVIDER_SITE_OTHER): Payer: Self-pay

## 2016-11-27 DIAGNOSIS — M80022D Age-related osteoporosis with current pathological fracture, left humerus, subsequent encounter for fracture with routine healing: Secondary | ICD-10-CM | POA: Diagnosis not present

## 2016-11-27 DIAGNOSIS — J449 Chronic obstructive pulmonary disease, unspecified: Secondary | ICD-10-CM | POA: Diagnosis not present

## 2016-11-27 DIAGNOSIS — D649 Anemia, unspecified: Secondary | ICD-10-CM | POA: Diagnosis not present

## 2016-11-27 DIAGNOSIS — I4891 Unspecified atrial fibrillation: Secondary | ICD-10-CM | POA: Diagnosis not present

## 2016-11-27 DIAGNOSIS — M1991 Primary osteoarthritis, unspecified site: Secondary | ICD-10-CM | POA: Diagnosis not present

## 2016-11-27 DIAGNOSIS — F418 Other specified anxiety disorders: Secondary | ICD-10-CM | POA: Diagnosis not present

## 2016-11-27 NOTE — Telephone Encounter (Signed)
Adriana Castro with [facility] would like verbal orders for patient for 2 x week for 1 week and 1 x week for 3 weeks.  Gave verbal orders for patient to have home health.  CB# is [phone]. Thank you.

## 2016-11-28 DIAGNOSIS — J449 Chronic obstructive pulmonary disease, unspecified: Secondary | ICD-10-CM | POA: Diagnosis not present

## 2016-11-28 DIAGNOSIS — I4891 Unspecified atrial fibrillation: Secondary | ICD-10-CM | POA: Diagnosis not present

## 2016-11-28 DIAGNOSIS — M80022D Age-related osteoporosis with current pathological fracture, left humerus, subsequent encounter for fracture with routine healing: Secondary | ICD-10-CM | POA: Diagnosis not present

## 2016-11-28 DIAGNOSIS — M1991 Primary osteoarthritis, unspecified site: Secondary | ICD-10-CM | POA: Diagnosis not present

## 2016-11-28 DIAGNOSIS — D649 Anemia, unspecified: Secondary | ICD-10-CM | POA: Diagnosis not present

## 2016-11-28 DIAGNOSIS — F418 Other specified anxiety disorders: Secondary | ICD-10-CM | POA: Diagnosis not present

## 2016-11-30 DIAGNOSIS — J449 Chronic obstructive pulmonary disease, unspecified: Secondary | ICD-10-CM | POA: Diagnosis not present

## 2016-11-30 DIAGNOSIS — I4891 Unspecified atrial fibrillation: Secondary | ICD-10-CM | POA: Diagnosis not present

## 2016-11-30 DIAGNOSIS — F418 Other specified anxiety disorders: Secondary | ICD-10-CM | POA: Diagnosis not present

## 2016-11-30 DIAGNOSIS — M1991 Primary osteoarthritis, unspecified site: Secondary | ICD-10-CM | POA: Diagnosis not present

## 2016-11-30 DIAGNOSIS — M80022D Age-related osteoporosis with current pathological fracture, left humerus, subsequent encounter for fracture with routine healing: Secondary | ICD-10-CM | POA: Diagnosis not present

## 2016-11-30 DIAGNOSIS — D649 Anemia, unspecified: Secondary | ICD-10-CM | POA: Diagnosis not present

## 2016-12-01 DIAGNOSIS — D649 Anemia, unspecified: Secondary | ICD-10-CM | POA: Diagnosis not present

## 2016-12-01 DIAGNOSIS — F418 Other specified anxiety disorders: Secondary | ICD-10-CM | POA: Diagnosis not present

## 2016-12-01 DIAGNOSIS — J449 Chronic obstructive pulmonary disease, unspecified: Secondary | ICD-10-CM | POA: Diagnosis not present

## 2016-12-01 DIAGNOSIS — M1991 Primary osteoarthritis, unspecified site: Secondary | ICD-10-CM | POA: Diagnosis not present

## 2016-12-01 DIAGNOSIS — M80022D Age-related osteoporosis with current pathological fracture, left humerus, subsequent encounter for fracture with routine healing: Secondary | ICD-10-CM | POA: Diagnosis not present

## 2016-12-01 DIAGNOSIS — I4891 Unspecified atrial fibrillation: Secondary | ICD-10-CM | POA: Diagnosis not present

## 2016-12-05 DIAGNOSIS — D649 Anemia, unspecified: Secondary | ICD-10-CM | POA: Diagnosis not present

## 2016-12-05 DIAGNOSIS — M1991 Primary osteoarthritis, unspecified site: Secondary | ICD-10-CM | POA: Diagnosis not present

## 2016-12-05 DIAGNOSIS — M80022D Age-related osteoporosis with current pathological fracture, left humerus, subsequent encounter for fracture with routine healing: Secondary | ICD-10-CM | POA: Diagnosis not present

## 2016-12-05 DIAGNOSIS — I4891 Unspecified atrial fibrillation: Secondary | ICD-10-CM | POA: Diagnosis not present

## 2016-12-05 DIAGNOSIS — J449 Chronic obstructive pulmonary disease, unspecified: Secondary | ICD-10-CM | POA: Diagnosis not present

## 2016-12-05 DIAGNOSIS — F418 Other specified anxiety disorders: Secondary | ICD-10-CM | POA: Diagnosis not present

## 2016-12-07 DIAGNOSIS — J449 Chronic obstructive pulmonary disease, unspecified: Secondary | ICD-10-CM | POA: Diagnosis not present

## 2016-12-07 DIAGNOSIS — M80022D Age-related osteoporosis with current pathological fracture, left humerus, subsequent encounter for fracture with routine healing: Secondary | ICD-10-CM | POA: Diagnosis not present

## 2016-12-07 DIAGNOSIS — F418 Other specified anxiety disorders: Secondary | ICD-10-CM | POA: Diagnosis not present

## 2016-12-07 DIAGNOSIS — I4891 Unspecified atrial fibrillation: Secondary | ICD-10-CM | POA: Diagnosis not present

## 2016-12-07 DIAGNOSIS — D649 Anemia, unspecified: Secondary | ICD-10-CM | POA: Diagnosis not present

## 2016-12-07 DIAGNOSIS — M1991 Primary osteoarthritis, unspecified site: Secondary | ICD-10-CM | POA: Diagnosis not present

## 2016-12-11 DIAGNOSIS — Z6825 Body mass index (BMI) 25.0-25.9, adult: Secondary | ICD-10-CM | POA: Diagnosis not present

## 2016-12-11 DIAGNOSIS — I10 Essential (primary) hypertension: Secondary | ICD-10-CM | POA: Diagnosis not present

## 2016-12-11 DIAGNOSIS — Z79899 Other long term (current) drug therapy: Secondary | ICD-10-CM | POA: Diagnosis not present

## 2016-12-11 DIAGNOSIS — Z09 Encounter for follow-up examination after completed treatment for conditions other than malignant neoplasm: Secondary | ICD-10-CM | POA: Diagnosis not present

## 2016-12-11 DIAGNOSIS — E559 Vitamin D deficiency, unspecified: Secondary | ICD-10-CM | POA: Diagnosis not present

## 2016-12-11 DIAGNOSIS — S42302A Unspecified fracture of shaft of humerus, left arm, initial encounter for closed fracture: Secondary | ICD-10-CM | POA: Diagnosis not present

## 2016-12-11 DIAGNOSIS — E785 Hyperlipidemia, unspecified: Secondary | ICD-10-CM | POA: Diagnosis not present

## 2016-12-11 DIAGNOSIS — J449 Chronic obstructive pulmonary disease, unspecified: Secondary | ICD-10-CM | POA: Diagnosis not present

## 2016-12-12 DIAGNOSIS — M80022D Age-related osteoporosis with current pathological fracture, left humerus, subsequent encounter for fracture with routine healing: Secondary | ICD-10-CM | POA: Diagnosis not present

## 2016-12-12 DIAGNOSIS — J449 Chronic obstructive pulmonary disease, unspecified: Secondary | ICD-10-CM | POA: Diagnosis not present

## 2016-12-12 DIAGNOSIS — I4891 Unspecified atrial fibrillation: Secondary | ICD-10-CM | POA: Diagnosis not present

## 2016-12-12 DIAGNOSIS — M1991 Primary osteoarthritis, unspecified site: Secondary | ICD-10-CM | POA: Diagnosis not present

## 2016-12-12 DIAGNOSIS — D649 Anemia, unspecified: Secondary | ICD-10-CM | POA: Diagnosis not present

## 2016-12-12 DIAGNOSIS — F418 Other specified anxiety disorders: Secondary | ICD-10-CM | POA: Diagnosis not present

## 2016-12-14 ENCOUNTER — Ambulatory Visit (INDEPENDENT_AMBULATORY_CARE_PROVIDER_SITE_OTHER): Payer: Medicare Other | Admitting: Orthopaedic Surgery

## 2016-12-14 ENCOUNTER — Ambulatory Visit (INDEPENDENT_AMBULATORY_CARE_PROVIDER_SITE_OTHER): Payer: Medicare Other

## 2016-12-14 ENCOUNTER — Encounter (INDEPENDENT_AMBULATORY_CARE_PROVIDER_SITE_OTHER): Payer: Self-pay | Admitting: Orthopaedic Surgery

## 2016-12-14 DIAGNOSIS — S42492D Other displaced fracture of lower end of left humerus, subsequent encounter for fracture with routine healing: Secondary | ICD-10-CM | POA: Diagnosis not present

## 2016-12-14 NOTE — Progress Notes (Signed)
Patient is 6 weeks status post ORIF of left supracondylar humerus fracture and medial epicondyle fracture. She is doing well overall. She is progressing with physical therapy and range of motion. Her surgical scars fully healed. She is neurovascularly intact. Range of motion is 45-130. She has full pronation and supination. X-ray show stable fixation with callus formation and bony consolidation. No complications. Patient should continue to work with therapy to improve range of motion strengthening. I'll see her back in 6 weeks with 2 view x-rays of the left elbow.

## 2016-12-15 DIAGNOSIS — J449 Chronic obstructive pulmonary disease, unspecified: Secondary | ICD-10-CM | POA: Diagnosis not present

## 2016-12-15 DIAGNOSIS — D649 Anemia, unspecified: Secondary | ICD-10-CM | POA: Diagnosis not present

## 2016-12-15 DIAGNOSIS — M1991 Primary osteoarthritis, unspecified site: Secondary | ICD-10-CM | POA: Diagnosis not present

## 2016-12-15 DIAGNOSIS — I4891 Unspecified atrial fibrillation: Secondary | ICD-10-CM | POA: Diagnosis not present

## 2016-12-15 DIAGNOSIS — F418 Other specified anxiety disorders: Secondary | ICD-10-CM | POA: Diagnosis not present

## 2016-12-15 DIAGNOSIS — M80022D Age-related osteoporosis with current pathological fracture, left humerus, subsequent encounter for fracture with routine healing: Secondary | ICD-10-CM | POA: Diagnosis not present

## 2017-01-17 DIAGNOSIS — J449 Chronic obstructive pulmonary disease, unspecified: Secondary | ICD-10-CM | POA: Diagnosis not present

## 2017-01-17 DIAGNOSIS — J988 Other specified respiratory disorders: Secondary | ICD-10-CM | POA: Diagnosis not present

## 2017-01-17 DIAGNOSIS — E663 Overweight: Secondary | ICD-10-CM | POA: Diagnosis not present

## 2017-01-17 DIAGNOSIS — Z6825 Body mass index (BMI) 25.0-25.9, adult: Secondary | ICD-10-CM | POA: Diagnosis not present

## 2017-01-25 ENCOUNTER — Encounter (INDEPENDENT_AMBULATORY_CARE_PROVIDER_SITE_OTHER): Payer: Self-pay | Admitting: Orthopaedic Surgery

## 2017-01-25 ENCOUNTER — Ambulatory Visit (INDEPENDENT_AMBULATORY_CARE_PROVIDER_SITE_OTHER): Payer: Medicare Other | Admitting: Orthopaedic Surgery

## 2017-01-25 ENCOUNTER — Ambulatory Visit (INDEPENDENT_AMBULATORY_CARE_PROVIDER_SITE_OTHER): Payer: Medicare Other

## 2017-01-25 DIAGNOSIS — S42492D Other displaced fracture of lower end of left humerus, subsequent encounter for fracture with routine healing: Secondary | ICD-10-CM

## 2017-01-25 NOTE — Progress Notes (Signed)
Patient is almost 3 months status post ORIF left supracondylar humerus fracture. She is overall doing well. Denies any pain. Her range of motion lacks 15 of full extension and has essentially normal for flexion. Her x-rays demonstrate abundant callus formation and stable fixation without any complications. Overall she is doing well. Not taking pain medicines. Follow-up in 2 months with 2 view x-rays of left elbow. Anticipate releasing her at that time.

## 2017-02-22 ENCOUNTER — Encounter: Payer: Self-pay | Admitting: Cardiology

## 2017-02-22 ENCOUNTER — Ambulatory Visit (INDEPENDENT_AMBULATORY_CARE_PROVIDER_SITE_OTHER): Payer: Medicare Other | Admitting: Cardiology

## 2017-02-22 VITALS — BP 120/60 | HR 64 | Resp 12 | Ht 66.0 in | Wt 152.1 lb

## 2017-02-22 DIAGNOSIS — E782 Mixed hyperlipidemia: Secondary | ICD-10-CM | POA: Diagnosis not present

## 2017-02-22 DIAGNOSIS — I10 Essential (primary) hypertension: Secondary | ICD-10-CM | POA: Diagnosis not present

## 2017-02-22 DIAGNOSIS — I4891 Unspecified atrial fibrillation: Secondary | ICD-10-CM

## 2017-02-22 NOTE — Patient Instructions (Signed)
Medication Instructions:   Your physician recommends that you continue on your current medications as directed. Please refer to the Current Medication list given to you today.   Labwork:  NONE  Testing/Procedures:  Your physician has recommended that you have a pulmonary function test. Pulmonary Function Tests are a group of tests that measure how well air moves in and out of your lungs.  A chest x-ray takes a picture of the organs and structures inside the chest, including the heart, lungs, and blood vessels. This test can show several things, including, whether the heart is enlarges; whether fluid is building up in the lungs; and whether pacemaker / defibrillator leads are still in place.   Follow-Up:  Your physician recommends that you schedule a follow-up appointment in: 6 months with Dr. Geraldo Pitter.    Any Other Special Instructions Will Be Listed Below (If Applicable).     If you need a refill on your cardiac medications before your next appointment, please call your pharmacy. '

## 2017-02-23 ENCOUNTER — Other Ambulatory Visit (INDEPENDENT_AMBULATORY_CARE_PROVIDER_SITE_OTHER): Payer: Medicare Other

## 2017-02-23 DIAGNOSIS — I4891 Unspecified atrial fibrillation: Secondary | ICD-10-CM | POA: Diagnosis not present

## 2017-02-28 NOTE — Progress Notes (Signed)
Cardiology Office Note:    Date:  02/28/2017   ID:  Adriana Castro, DOB 12-08-1928, MRN [id number]  PCP:  Nicholos Johns, MD  Cardiologist:  Jenean Lindau, MD   Referring MD: Nicholos Johns, MD    ASSESSMENT:    1. paroxysmal atrial fibrillation   2. Mixed hyperlipidemia   3. Essential hypertension    PLAN:    In order of problems listed above:  1. Patient has been advised to continue current medications. She is in sinus rhythm. Her medications are working appropriately. She leads  Life which is sedentary but age-appropriate. Her gait is stable. She is continuing her current medications. Because of being on amiodarone therapy she will undergo chest x-ray and pulmonary function testing. 2. Her blood pressure stable and lipids are followed by her primary care physician. She will be seen in follow-up appointment in 6 months or earlier if she has any concerns   Medication Adjustments/Labs and Tests Ordered: Current medicines are reviewed at length with the patient today.  Concerns regarding medicines are outlined above.  Orders Placed This Encounter  Procedures  . DG Chest 2 View  . Pulmonary function test  . Electrocardiogram report   No orders of the defined types were placed in this encounter.    History of Present Illness:    Adriana Castro is a 83 y.o. female who is being seen today for the evaluation of paroxysmal atrial fibrillation at the request of Nicholos Johns, MD. Patient is a pleasant 83 year old female. She has past medical history approximately fibrillation, hyperlipidemia and essential hypertension. She also has history of COPD and awaiting cancer. She denies any problems at this time and takes care of activities of daily living. No chest pain orthopnea or PND. She mentions to me that her gait is stable. She is always accompanied by a very supportive daughter. At the time of my evaluation she is alert awake oriented and in no distress.  Past Medical History:  Diagnosis  Date  . Anxiety   . Arthritis   . COPD (chronic obstructive pulmonary disease) ([place])   . Dysrhythmia    ATRIAL FIBRILATION  . Heart murmur   . Irregular heart beat   . Leaky heart valve   . Shortness of breath     Past Surgical History:  Procedure Laterality Date  . ORIF HUMERUS FRACTURE Left 11/01/2016   Procedure: OPEN REDUCTION INTERNAL FIXATION (ORIF) LEFT SUPRACONDYLAR HUMERUS AND MEDIAL EPICONDYLE;  Surgeon: Leandrew Koyanagi, MD;  Location: [facility];  Service: Orthopedics;  Laterality: Left;  . SHOULDER SURGERY Right    ?   2002     . SKIN CANCER EXCISION     X 5  . TONSILLECTOMY      Current Medications: Current Meds  Medication Sig  . diltiazem (CARDIZEM) 120 MG tablet Take 120 mg by mouth daily.  . fluticasone (FLONASE) 50 MCG/ACT nasal spray Place 1 spray into both nostrils daily as needed for allergies.  Marland Kitchen guaiFENesin (MUCINEX) 600 MG 12 hr tablet Take 2 tablets (1,200 mg total) by mouth 2 (two) times daily.  Marland Kitchen ipratropium (ATROVENT) 0.02 % nebulizer solution Take 500 mcg by nebulization 2 (two) times daily.   Marland Kitchen levalbuterol (XOPENEX HFA) 45 MCG/ACT inhaler Inhale 1-2 puffs into the lungs every 4 (four) hours as needed for wheezing or shortness of breath.  . levalbuterol (XOPENEX) 0.63 MG/3ML nebulizer solution Take 3 mLs by nebulization every 4 (four) hours as needed for wheezing.  . montelukast (SINGULAIR) 10  MG tablet Take 10 mg by mouth every evening.  . Multiple Vitamin (MULTI-VITAMINS) TABS Take 1 tablet by mouth daily.  . nitroGLYCERIN (NITROSTAT) 0.4 MG SL tablet Place 0.4 mg under the tongue every 5 (five) minutes as needed.  Marland Kitchen omeprazole (PRILOSEC) 20 MG capsule Take 20 mg by mouth every morning.  . sertraline (ZOLOFT) 100 MG tablet Take 100 mg by mouth at bedtime.  . simvastatin (ZOCOR) 10 MG tablet Take 10 mg by mouth every evening.  . tiotropium (SPIRIVA) 18 MCG inhalation capsule Place 18 mcg into inhaler and inhale daily as needed (wheezing).   . Vitamin D,  Ergocalciferol, (DRISDOL) 50000 units CAPS capsule Take 50,000 Units by mouth as needed. supplement  . vitamin E 400 UNIT capsule Take 800 Units by mouth daily.  Alveda Reasons 15 MG TABS tablet Take 15 mg by mouth every evening.   . [DISCONTINUED] amiodarone (PACERONE) 200 MG tablet Take 100 mg by mouth 2 (two) times daily.  . [DISCONTINUED] levofloxacin (LEVAQUIN) 250 MG tablet Take 3 tablets (750 mg total) by mouth every other day.  . [DISCONTINUED] ondansetron (ZOFRAN) 4 MG tablet Take 1-2 tablets (4-8 mg total) by mouth every 8 (eight) hours as needed for nausea or vomiting.  . [DISCONTINUED] oxyCODONE (OXY IR/ROXICODONE) 5 MG immediate release tablet Take 1-3 tablets (5-15 mg total) by mouth every 4 (four) hours as needed.  . [DISCONTINUED] oxyCODONE-acetaminophen (PERCOCET/ROXICET) 5-325 MG tablet Take 1 tablet by mouth every 8 (eight) hours as needed for severe pain.  . [DISCONTINUED] predniSONE (DELTASONE) 10 MG tablet Take 6-5-4-3-2-1 PO daily till gone  . [DISCONTINUED] zinc sulfate 220 (50 Zn) MG capsule Take 1 capsule (220 mg total) by mouth daily.     Allergies:   Albuterol   Social History   Social History  . Marital status: Widowed    Spouse name: N/A  . Number of children: N/A  . Years of education: N/A   Social History Main Topics  . Smoking status: Former Smoker    Quit date: 06/07/2012  . Smokeless tobacco: Never Used  . Alcohol use No  . Drug use: No  . Sexual activity: Not Asked   Other Topics Concern  . None   Social History Narrative  . None     Family History: The patient's family history includes Asthma in her father; Diabetes in her mother.  ROS:   Please see the history of present illness.    All other systems reviewed and are negative.  EKGs/Labs/Other Studies Reviewed:    The following studies were reviewed today: I reviewed records from primary care physician and hospital records and discussed with her extensively my findings.   Recent  Labs: 10/30/2016: Hemoglobin 11.2; Platelets 212 11/04/2016: BUN 16; Creatinine, Ser 0.87; Potassium 4.3; Sodium 139  Recent Lipid Panel No results found for: CHOL, TRIG, HDL, CHOLHDL, VLDL, LDLCALC, LDLDIRECT  Physical Exam:    VS:  BP 120/60   Pulse 64   Resp 12   Ht 5\' 6"  (1.676 m)   Wt 152 lb 1.9 oz (69 kg)   BMI 24.55 kg/m     Wt Readings from Last 3 Encounters:  02/22/17 152 lb 1.9 oz (69 kg)  11/01/16 155 lb (70.3 kg)  10/30/16 153 lb (69.4 kg)     GEN: Patient is in no acute distress HEENT: Normal NECK: No JVD; No carotid bruits LYMPHATICS: No lymphadenopathy CARDIAC: S1 S2 regular, 2/6 systolic murmur at the apex. RESPIRATORY:  Clear to auscultation without rales,  wheezing or rhonchi  ABDOMEN: Soft, non-tender, non-distended MUSCULOSKELETAL:  No edema; No deformity  SKIN: Warm and dry NEUROLOGIC:  Alert and oriented x 3 PSYCHIATRIC:  Normal affect    Signed, Jenean Lindau, MD  02/28/2017 10:53 AM    [place]

## 2017-03-01 DIAGNOSIS — I6523 Occlusion and stenosis of bilateral carotid arteries: Secondary | ICD-10-CM | POA: Diagnosis not present

## 2017-03-01 DIAGNOSIS — I714 Abdominal aortic aneurysm, without rupture: Secondary | ICD-10-CM | POA: Diagnosis not present

## 2017-03-01 DIAGNOSIS — I70203 Unspecified atherosclerosis of native arteries of extremities, bilateral legs: Secondary | ICD-10-CM | POA: Diagnosis not present

## 2017-03-06 ENCOUNTER — Telehealth: Payer: Self-pay

## 2017-03-06 DIAGNOSIS — Z79899 Other long term (current) drug therapy: Secondary | ICD-10-CM | POA: Diagnosis not present

## 2017-03-06 DIAGNOSIS — I700 Atherosclerosis of aorta: Secondary | ICD-10-CM | POA: Diagnosis not present

## 2017-03-06 DIAGNOSIS — R942 Abnormal results of pulmonary function studies: Secondary | ICD-10-CM | POA: Diagnosis not present

## 2017-03-06 DIAGNOSIS — R918 Other nonspecific abnormal finding of lung field: Secondary | ICD-10-CM | POA: Diagnosis not present

## 2017-03-06 NOTE — Telephone Encounter (Signed)
Discussed with patient the results from her chest xray. The patient states that she is feeling well and that she will follow up with Dr. Geraldo Pitter. She did not have any further concerns or questions.

## 2017-03-07 DIAGNOSIS — Z08 Encounter for follow-up examination after completed treatment for malignant neoplasm: Secondary | ICD-10-CM | POA: Diagnosis not present

## 2017-03-07 DIAGNOSIS — Z90710 Acquired absence of both cervix and uterus: Secondary | ICD-10-CM | POA: Diagnosis not present

## 2017-03-07 DIAGNOSIS — Z9221 Personal history of antineoplastic chemotherapy: Secondary | ICD-10-CM | POA: Diagnosis not present

## 2017-03-07 DIAGNOSIS — Z8543 Personal history of malignant neoplasm of ovary: Secondary | ICD-10-CM | POA: Diagnosis not present

## 2017-03-07 DIAGNOSIS — Z23 Encounter for immunization: Secondary | ICD-10-CM | POA: Diagnosis not present

## 2017-03-08 ENCOUNTER — Other Ambulatory Visit: Payer: Self-pay

## 2017-03-08 DIAGNOSIS — J441 Chronic obstructive pulmonary disease with (acute) exacerbation: Secondary | ICD-10-CM

## 2017-03-09 DIAGNOSIS — Z1389 Encounter for screening for other disorder: Secondary | ICD-10-CM | POA: Diagnosis not present

## 2017-03-09 DIAGNOSIS — Z0000 Encounter for general adult medical examination without abnormal findings: Secondary | ICD-10-CM | POA: Diagnosis not present

## 2017-03-09 DIAGNOSIS — E785 Hyperlipidemia, unspecified: Secondary | ICD-10-CM | POA: Diagnosis not present

## 2017-03-09 DIAGNOSIS — Z1231 Encounter for screening mammogram for malignant neoplasm of breast: Secondary | ICD-10-CM | POA: Diagnosis not present

## 2017-03-09 DIAGNOSIS — Z9181 History of falling: Secondary | ICD-10-CM | POA: Diagnosis not present

## 2017-03-19 DIAGNOSIS — I739 Peripheral vascular disease, unspecified: Secondary | ICD-10-CM | POA: Diagnosis not present

## 2017-03-19 DIAGNOSIS — I6523 Occlusion and stenosis of bilateral carotid arteries: Secondary | ICD-10-CM | POA: Diagnosis not present

## 2017-03-19 DIAGNOSIS — I714 Abdominal aortic aneurysm, without rupture: Secondary | ICD-10-CM | POA: Diagnosis not present

## 2017-03-27 ENCOUNTER — Ambulatory Visit (INDEPENDENT_AMBULATORY_CARE_PROVIDER_SITE_OTHER): Payer: Medicare Other | Admitting: Orthopaedic Surgery

## 2017-03-27 ENCOUNTER — Encounter (INDEPENDENT_AMBULATORY_CARE_PROVIDER_SITE_OTHER): Payer: Self-pay | Admitting: Orthopaedic Surgery

## 2017-03-27 ENCOUNTER — Ambulatory Visit (INDEPENDENT_AMBULATORY_CARE_PROVIDER_SITE_OTHER): Payer: Medicare Other

## 2017-03-27 DIAGNOSIS — S42492D Other displaced fracture of lower end of left humerus, subsequent encounter for fracture with routine healing: Secondary | ICD-10-CM | POA: Diagnosis not present

## 2017-03-27 NOTE — Progress Notes (Signed)
Office Visit Note   Patient: Adriana Castro           Date of Birth: 13-Jun-1928           MRN: [id number] Visit Date: 03/27/2017              Requested by: Nicholos Johns, MD [address] [place], [place] [address] PCP: Nicholos Johns, MD   Assessment & Plan: Visit Diagnoses:  1. Closed bicondylar fracture of distal end of left humerus with routine healing, subsequent encounter     Plan: Patient has demonstrated healing of her distal humerus fracture.  At this point she has reached MMI.  Questions encouraged and answered.  Follow-up as needed.  Follow-Up Instructions: Return if symptoms worsen or fail to improve.   Orders:  Orders Placed This Encounter  Procedures  . XR Elbow 2 Views Left   No orders of the defined types were placed in this encounter.     Procedures: No procedures performed   Clinical Data: No additional findings.   Subjective: Chief Complaint  Patient presents with  . Left Elbow - Pain, Follow-up    Patient is 5 months status post ORIF left distal humerus fracture.  Today for follow-up.  She is doing well overall.  She denies any pain or discomfort.  Denies any numbness and tingling.    Review of Systems   Objective: Vital Signs: There were no vitals taken for this visit.  Physical Exam  Ortho Exam Left elbow exam shows a fully healed surgical scar.  Range of motion is 10 degrees to 140 degrees.  Full pronation supination. Specialty Comments:  No specialty comments available.  Imaging: Xr Elbow 2 Views Left  Result Date: 03/27/2017 Healed distal humerus fracture with abundant callus formation.  No complications.    PMFS History: Patient Active Problem List   Diagnosis Date Noted  . History of open reduction and internal fixation (ORIF) procedure 11/01/2016  . Closed bicondylar fracture of distal end of left humerus   . HTN (hypertension) 09/10/2013  . Hyperlipidemia 09/10/2013  . COPD with acute exacerbation ([place])  02/05/2013  . Hypoxia 02/05/2013  . Chest pain 02/05/2013  . Paroxysmal atrial fibrillation ([place]) 02/05/2013  . Leaky heart valve    Past Medical History:  Diagnosis Date  . Anxiety   . Arthritis   . COPD (chronic obstructive pulmonary disease) ([place])   . Dysrhythmia    ATRIAL FIBRILATION  . Heart murmur   . Irregular heart beat   . Leaky heart valve   . Shortness of breath     Family History  Problem Relation Age of Onset  . Diabetes Mother   . Asthma Father     Past Surgical History:  Procedure Laterality Date  . ORIF HUMERUS FRACTURE Left 11/01/2016   Procedure: OPEN REDUCTION INTERNAL FIXATION (ORIF) LEFT SUPRACONDYLAR HUMERUS AND MEDIAL EPICONDYLE;  Surgeon: Leandrew Koyanagi, MD;  Location: [facility];  Service: Orthopedics;  Laterality: Left;  . SHOULDER SURGERY Right    ?   2002     . SKIN CANCER EXCISION     X 5  . TONSILLECTOMY     Social History   Occupational History  . Not on file.   Social History Main Topics  . Smoking status: Former Smoker    Quit date: 06/07/2012  . Smokeless tobacco: Never Used  . Alcohol use No  . Drug use: No  . Sexual activity: Not on file

## 2017-04-09 ENCOUNTER — Ambulatory Visit (INDEPENDENT_AMBULATORY_CARE_PROVIDER_SITE_OTHER): Payer: Medicare Other | Admitting: Orthopaedic Surgery

## 2017-04-09 DIAGNOSIS — I714 Abdominal aortic aneurysm, without rupture: Secondary | ICD-10-CM | POA: Diagnosis not present

## 2017-04-17 DIAGNOSIS — Z6825 Body mass index (BMI) 25.0-25.9, adult: Secondary | ICD-10-CM | POA: Diagnosis not present

## 2017-04-17 DIAGNOSIS — K5909 Other constipation: Secondary | ICD-10-CM | POA: Diagnosis not present

## 2017-04-17 DIAGNOSIS — I4891 Unspecified atrial fibrillation: Secondary | ICD-10-CM | POA: Diagnosis not present

## 2017-04-17 DIAGNOSIS — E559 Vitamin D deficiency, unspecified: Secondary | ICD-10-CM | POA: Diagnosis not present

## 2017-04-17 DIAGNOSIS — I10 Essential (primary) hypertension: Secondary | ICD-10-CM | POA: Diagnosis not present

## 2017-04-17 DIAGNOSIS — E785 Hyperlipidemia, unspecified: Secondary | ICD-10-CM | POA: Diagnosis not present

## 2017-04-17 DIAGNOSIS — J449 Chronic obstructive pulmonary disease, unspecified: Secondary | ICD-10-CM | POA: Diagnosis not present

## 2017-04-17 DIAGNOSIS — Z79899 Other long term (current) drug therapy: Secondary | ICD-10-CM | POA: Diagnosis not present

## 2017-04-17 DIAGNOSIS — I714 Abdominal aortic aneurysm, without rupture: Secondary | ICD-10-CM | POA: Diagnosis not present

## 2017-06-19 DIAGNOSIS — Z6825 Body mass index (BMI) 25.0-25.9, adult: Secondary | ICD-10-CM | POA: Diagnosis not present

## 2017-06-19 DIAGNOSIS — R5383 Other fatigue: Secondary | ICD-10-CM | POA: Diagnosis not present

## 2017-08-15 DIAGNOSIS — E663 Overweight: Secondary | ICD-10-CM | POA: Diagnosis not present

## 2017-08-15 DIAGNOSIS — J309 Allergic rhinitis, unspecified: Secondary | ICD-10-CM | POA: Diagnosis not present

## 2017-08-15 DIAGNOSIS — E785 Hyperlipidemia, unspecified: Secondary | ICD-10-CM | POA: Diagnosis not present

## 2017-08-15 DIAGNOSIS — D649 Anemia, unspecified: Secondary | ICD-10-CM | POA: Diagnosis not present

## 2017-08-15 DIAGNOSIS — F39 Unspecified mood [affective] disorder: Secondary | ICD-10-CM | POA: Diagnosis not present

## 2017-08-15 DIAGNOSIS — Z6825 Body mass index (BMI) 25.0-25.9, adult: Secondary | ICD-10-CM | POA: Diagnosis not present

## 2017-08-15 DIAGNOSIS — I714 Abdominal aortic aneurysm, without rupture: Secondary | ICD-10-CM | POA: Diagnosis not present

## 2017-08-15 DIAGNOSIS — J449 Chronic obstructive pulmonary disease, unspecified: Secondary | ICD-10-CM | POA: Diagnosis not present

## 2017-08-15 DIAGNOSIS — E559 Vitamin D deficiency, unspecified: Secondary | ICD-10-CM | POA: Diagnosis not present

## 2017-08-15 DIAGNOSIS — K5909 Other constipation: Secondary | ICD-10-CM | POA: Diagnosis not present

## 2017-08-15 DIAGNOSIS — I4891 Unspecified atrial fibrillation: Secondary | ICD-10-CM | POA: Diagnosis not present

## 2017-08-15 DIAGNOSIS — Z79899 Other long term (current) drug therapy: Secondary | ICD-10-CM | POA: Diagnosis not present

## 2017-09-05 DIAGNOSIS — Z08 Encounter for follow-up examination after completed treatment for malignant neoplasm: Secondary | ICD-10-CM | POA: Diagnosis not present

## 2017-09-05 DIAGNOSIS — Z8543 Personal history of malignant neoplasm of ovary: Secondary | ICD-10-CM | POA: Diagnosis not present

## 2017-09-05 DIAGNOSIS — Z888 Allergy status to other drugs, medicaments and biological substances status: Secondary | ICD-10-CM | POA: Diagnosis not present

## 2017-09-13 DIAGNOSIS — L570 Actinic keratosis: Secondary | ICD-10-CM | POA: Diagnosis not present

## 2017-09-13 DIAGNOSIS — R233 Spontaneous ecchymoses: Secondary | ICD-10-CM | POA: Diagnosis not present

## 2017-09-13 DIAGNOSIS — L728 Other follicular cysts of the skin and subcutaneous tissue: Secondary | ICD-10-CM | POA: Diagnosis not present

## 2017-10-17 DIAGNOSIS — S0990XA Unspecified injury of head, initial encounter: Secondary | ICD-10-CM | POA: Diagnosis not present

## 2017-10-17 DIAGNOSIS — S0191XA Laceration without foreign body of unspecified part of head, initial encounter: Secondary | ICD-10-CM | POA: Diagnosis not present

## 2017-10-17 DIAGNOSIS — S022XXA Fracture of nasal bones, initial encounter for closed fracture: Secondary | ICD-10-CM | POA: Diagnosis not present

## 2017-10-17 DIAGNOSIS — S199XXA Unspecified injury of neck, initial encounter: Secondary | ICD-10-CM | POA: Diagnosis not present

## 2017-10-17 DIAGNOSIS — W19XXXA Unspecified fall, initial encounter: Secondary | ICD-10-CM | POA: Diagnosis not present

## 2017-10-17 DIAGNOSIS — S0993XA Unspecified injury of face, initial encounter: Secondary | ICD-10-CM | POA: Diagnosis not present

## 2017-10-17 DIAGNOSIS — S0083XA Contusion of other part of head, initial encounter: Secondary | ICD-10-CM | POA: Diagnosis not present

## 2017-10-24 DIAGNOSIS — S0992XA Unspecified injury of nose, initial encounter: Secondary | ICD-10-CM | POA: Diagnosis not present

## 2017-10-24 DIAGNOSIS — J342 Deviated nasal septum: Secondary | ICD-10-CM | POA: Diagnosis not present

## 2017-10-24 DIAGNOSIS — W19XXXA Unspecified fall, initial encounter: Secondary | ICD-10-CM | POA: Diagnosis not present

## 2017-10-24 DIAGNOSIS — E041 Nontoxic single thyroid nodule: Secondary | ICD-10-CM | POA: Diagnosis not present

## 2017-10-24 DIAGNOSIS — S022XXA Fracture of nasal bones, initial encounter for closed fracture: Secondary | ICD-10-CM | POA: Diagnosis not present

## 2017-11-02 DIAGNOSIS — E041 Nontoxic single thyroid nodule: Secondary | ICD-10-CM | POA: Diagnosis not present

## 2017-11-12 ENCOUNTER — Telehealth: Payer: Self-pay | Admitting: Cardiology

## 2017-11-12 NOTE — Telephone Encounter (Signed)
[place] from [facility] called to say patient is having an US aspiration of neck and she needs to be off Zarelto for 24 hours prior. Is this a problem?

## 2017-11-12 NOTE — Telephone Encounter (Signed)
Attempted to contacted the office, voicemail says it is currently closed. Will continue efforts.

## 2017-11-12 NOTE — Telephone Encounter (Signed)
Please advise 

## 2017-11-13 NOTE — Telephone Encounter (Signed)
Spoke with [place] with [facility] informer her of the advisement of Dr. Geraldo Pitter and to inform the patient that during that hold period her risk for stroke would be increased. Resumption of medication will be determined by the ENT MD.

## 2017-11-20 DIAGNOSIS — E041 Nontoxic single thyroid nodule: Secondary | ICD-10-CM | POA: Diagnosis not present

## 2017-11-27 DIAGNOSIS — J342 Deviated nasal septum: Secondary | ICD-10-CM | POA: Diagnosis not present

## 2017-11-27 DIAGNOSIS — E041 Nontoxic single thyroid nodule: Secondary | ICD-10-CM | POA: Diagnosis not present

## 2017-12-18 DIAGNOSIS — J449 Chronic obstructive pulmonary disease, unspecified: Secondary | ICD-10-CM | POA: Diagnosis not present

## 2017-12-18 DIAGNOSIS — E663 Overweight: Secondary | ICD-10-CM | POA: Diagnosis not present

## 2017-12-18 DIAGNOSIS — Z1339 Encounter for screening examination for other mental health and behavioral disorders: Secondary | ICD-10-CM | POA: Diagnosis not present

## 2017-12-18 DIAGNOSIS — I714 Abdominal aortic aneurysm, without rupture: Secondary | ICD-10-CM | POA: Diagnosis not present

## 2017-12-18 DIAGNOSIS — Z6825 Body mass index (BMI) 25.0-25.9, adult: Secondary | ICD-10-CM | POA: Diagnosis not present

## 2017-12-18 DIAGNOSIS — Z79899 Other long term (current) drug therapy: Secondary | ICD-10-CM | POA: Diagnosis not present

## 2017-12-18 DIAGNOSIS — E559 Vitamin D deficiency, unspecified: Secondary | ICD-10-CM | POA: Diagnosis not present

## 2017-12-19 ENCOUNTER — Encounter: Payer: Self-pay | Admitting: Cardiology

## 2017-12-19 DIAGNOSIS — F329 Major depressive disorder, single episode, unspecified: Secondary | ICD-10-CM | POA: Diagnosis not present

## 2017-12-19 DIAGNOSIS — I4891 Unspecified atrial fibrillation: Secondary | ICD-10-CM

## 2017-12-19 DIAGNOSIS — I482 Chronic atrial fibrillation: Secondary | ICD-10-CM | POA: Diagnosis not present

## 2017-12-19 DIAGNOSIS — I10 Essential (primary) hypertension: Secondary | ICD-10-CM

## 2017-12-19 DIAGNOSIS — Z7901 Long term (current) use of anticoagulants: Secondary | ICD-10-CM | POA: Diagnosis not present

## 2017-12-19 DIAGNOSIS — R002 Palpitations: Secondary | ICD-10-CM | POA: Diagnosis not present

## 2017-12-19 DIAGNOSIS — K219 Gastro-esophageal reflux disease without esophagitis: Secondary | ICD-10-CM | POA: Diagnosis not present

## 2017-12-19 DIAGNOSIS — J449 Chronic obstructive pulmonary disease, unspecified: Secondary | ICD-10-CM

## 2017-12-19 DIAGNOSIS — R8271 Bacteriuria: Secondary | ICD-10-CM

## 2017-12-19 DIAGNOSIS — D649 Anemia, unspecified: Secondary | ICD-10-CM

## 2017-12-19 DIAGNOSIS — E785 Hyperlipidemia, unspecified: Secondary | ICD-10-CM

## 2017-12-19 DIAGNOSIS — R0602 Shortness of breath: Secondary | ICD-10-CM | POA: Diagnosis not present

## 2017-12-19 DIAGNOSIS — Z79899 Other long term (current) drug therapy: Secondary | ICD-10-CM | POA: Diagnosis not present

## 2017-12-20 ENCOUNTER — Ambulatory Visit: Payer: Medicare Other | Admitting: Cardiology

## 2017-12-20 DIAGNOSIS — D649 Anemia, unspecified: Secondary | ICD-10-CM | POA: Diagnosis not present

## 2017-12-20 DIAGNOSIS — R0602 Shortness of breath: Secondary | ICD-10-CM | POA: Diagnosis not present

## 2018-01-14 ENCOUNTER — Telehealth: Payer: Self-pay | Admitting: Cardiology

## 2018-01-14 ENCOUNTER — Other Ambulatory Visit: Payer: Self-pay

## 2018-01-14 MED ORDER — NITROGLYCERIN 0.4 MG SL SUBL: 0.4000 mg | SUBLINGUAL_TABLET | SUBLINGUAL | 11 refills | Status: DC | PRN

## 2018-01-14 NOTE — Telephone Encounter (Signed)
Refill was sent

## 2018-01-14 NOTE — Telephone Encounter (Signed)
°*  STAT* If patient is at the pharmacy, call can be transferred to refill team.   1. Which medications need to be refilled? (please list name of each medication and dose if known) Nitroglycerin 0.4 SL  2. Which pharmacy/location (including street and city if local pharmacy) is medication to be sent to?[facility] [address]  3. Do they need a 30 day or 90 day supply? 30  Patient is scheduled for RRR on 01/31/2018 and she just needs a new script called to keep her current.

## 2018-01-24 DIAGNOSIS — I10 Essential (primary) hypertension: Secondary | ICD-10-CM | POA: Diagnosis not present

## 2018-01-24 DIAGNOSIS — I714 Abdominal aortic aneurysm, without rupture: Secondary | ICD-10-CM | POA: Diagnosis not present

## 2018-01-24 DIAGNOSIS — E663 Overweight: Secondary | ICD-10-CM | POA: Diagnosis not present

## 2018-01-24 DIAGNOSIS — D649 Anemia, unspecified: Secondary | ICD-10-CM | POA: Diagnosis not present

## 2018-01-31 ENCOUNTER — Ambulatory Visit (INDEPENDENT_AMBULATORY_CARE_PROVIDER_SITE_OTHER): Payer: Medicare Other | Admitting: Cardiology

## 2018-01-31 ENCOUNTER — Encounter: Payer: Self-pay | Admitting: Cardiology

## 2018-01-31 VITALS — BP 128/68 | HR 85 | Ht 66.0 in | Wt 143.0 lb

## 2018-01-31 DIAGNOSIS — E782 Mixed hyperlipidemia: Secondary | ICD-10-CM | POA: Diagnosis not present

## 2018-01-31 DIAGNOSIS — I10 Essential (primary) hypertension: Secondary | ICD-10-CM | POA: Diagnosis not present

## 2018-01-31 DIAGNOSIS — I480 Paroxysmal atrial fibrillation: Secondary | ICD-10-CM

## 2018-01-31 NOTE — Progress Notes (Signed)
Cardiology Office Note:    Date:  01/31/2018   ID:  Adriana Castro, DOB [birth year], MRN [id number]  PCP:  Nicholos Johns, MD  Cardiologist:  Jenean Lindau, MD   Referring MD: Nicholos Johns, MD    ASSESSMENT:    1. Paroxysmal atrial fibrillation (HCC)   2. Essential hypertension   3. Mixed hyperlipidemia    PLAN:    In order of problems listed above:   I discussed my findings with the patient at extensive length.  She appears comfortable.  In view of her atrial fibrillation with elevated ventricular rate, we will increase her amiodarone to 200 mg daily for the next few days.  I will also obtain a Chem-7 and a CBC today. She knows to go to the nearest emergency room for any significant concerns.  Her blood pressures have been stable. She will be seen in follow-up appointment in 6 months or earlier if she has any concerns.  Medication Adjustments/Labs and Tests Ordered: Current medicines are reviewed at length with the patient today.  Concerns regarding medicines are outlined above.  Orders Placed This Encounter  Procedures  . EKG 12-Lead   No orders of the defined types were placed in this encounter.    No chief complaint on file.    History of Present Illness:    Adriana Castro is a 84 y.o. female.  Patient has approximately fibrillation.  She denies any problems at this time except some shortness of breath and easy fatigability.  No chest pain orthopnea or PND.  She had an episode of chest tightness a month ago relieved with nitroglycerin.  This has not recurred subsequently.  At the time of my evaluation, the patient is alert awake oriented and in no distress.  Past Medical History:  Diagnosis Date  . Anxiety   . Anxiety   . Arthritis   . COPD (chronic obstructive pulmonary disease) ([place])   . Dyslipidemia   . Dysrhythmia    ATRIAL FIBRILATION  . GERD (gastroesophageal reflux disease)   . Heart murmur   . Hypertension   . Irregular heart beat   . Leaky heart valve     . Mood disorder ([place])   . Ovarian cancer ([place])   . Shortness of breath     Past Surgical History:  Procedure Laterality Date  . BREAST BIOPSY    . CATARACT EXTRACTION, BILATERAL    . ORIF HUMERUS FRACTURE Left 11/01/2016   Procedure: OPEN REDUCTION INTERNAL FIXATION (ORIF) LEFT SUPRACONDYLAR HUMERUS AND MEDIAL EPICONDYLE;  Surgeon: Leandrew Koyanagi, MD;  Location: [place];  Service: Orthopedics;  Laterality: Left;  . SHOULDER SURGERY Right    ?   2002     . SKIN CANCER EXCISION     X 5  . TONSILLECTOMY      Current Medications: Current Meds  Medication Sig  . amiodarone (PACERONE) 100 MG tablet Take 100 mg by mouth daily.  Marland Kitchen aspirin EC 81 MG tablet Take 81 mg by mouth daily.  Marland Kitchen diltiazem (CARDIZEM) 120 MG tablet Take 120 mg by mouth daily.  . fluticasone (FLONASE) 50 MCG/ACT nasal spray Place 1 spray into both nostrils daily as needed for allergies.  Marland Kitchen ipratropium (ATROVENT) 0.02 % nebulizer solution Take 500 mcg by nebulization 2 (two) times daily.   . montelukast (SINGULAIR) 10 MG tablet Take 10 mg by mouth every evening.  . Multiple Vitamin (MULTI-VITAMINS) TABS Take 1 tablet by mouth daily.  . nitroGLYCERIN (NITROSTAT) 0.4 MG SL tablet Place  1 tablet (0.4 mg total) under the tongue every 5 (five) minutes as needed.  Marland Kitchen omeprazole (PRILOSEC) 20 MG capsule Take 20 mg by mouth every morning.  . simvastatin (ZOCOR) 10 MG tablet Take 10 mg by mouth every evening.  . Vitamin D, Ergocalciferol, (DRISDOL) 50000 units CAPS capsule Take 50,000 Units by mouth as needed. supplement  . vitamin E 400 UNIT capsule Take 800 Units by mouth daily.  Alveda Reasons 15 MG TABS tablet Take 15 mg by mouth every evening.      Allergies:   Albuterol   Social History   Socioeconomic History  . Marital status: Widowed    Spouse name: Not on file  . Number of children: Not on file  . Years of education: Not on file  . Highest education level: Not on file  Occupational History  . Not on file  Social  Needs  . Financial resource strain: Not on file  . Food insecurity:    Worry: Not on file    Inability: Not on file  . Transportation needs:    Medical: Not on file    Non-medical: Not on file  Tobacco Use  . Smoking status: Former Smoker    Last attempt to quit: 06/07/2012    Years since quitting: 5.6  . Smokeless tobacco: Never Used  Substance and Sexual Activity  . Alcohol use: No  . Drug use: No  . Sexual activity: Not on file  Lifestyle  . Physical activity:    Days per week: Not on file    Minutes per session: Not on file  . Stress: Not on file  Relationships  . Social connections:    Talks on phone: Not on file    Gets together: Not on file    Attends religious service: Not on file    Active member of club or organization: Not on file    Attends meetings of clubs or organizations: Not on file    Relationship status: Not on file  Other Topics Concern  . Not on file  Social History Narrative  . Not on file     Family History: The patient's family history includes Asthma in her father; Breast cancer in her sister; Diabetes in her brother and mother; Heart attack in her mother; Heart disease in her mother; Hypertension in her mother; Prostate cancer in her brother; Stroke in her brother and father.  ROS:   Please see the history of present illness.    All other systems reviewed and are negative.  EKGs/Labs/Other Studies Reviewed:    The following studies were reviewed today: EKG revealed atrial fibrillation with fairly rapid ventricular rate 46   Recent Labs: No results found for requested labs within last 8760 hours.  Recent Lipid Panel No results found for: CHOL, TRIG, HDL, CHOLHDL, VLDL, LDLCALC, LDLDIRECT  Physical Exam:    VS:  BP 128/68 (BP Location: Right Arm, Patient Position: Sitting, Cuff Size: Normal)   Pulse 85   Ht 5\' 6"  (1.676 m)   Wt 143 lb (64.9 kg)   SpO2 94%   BMI 23.08 kg/m     Wt Readings from Last 3 Encounters:  01/31/18 143 lb  (64.9 kg)  02/22/17 152 lb 1.9 oz (69 kg)  11/01/16 155 lb (70.3 kg)     GEN: Patient is in no acute distress HEENT: Normal NECK: No JVD; No carotid bruits LYMPHATICS: No lymphadenopathy CARDIAC: Hear sounds regular, 2/6 systolic murmur at the apex. RESPIRATORY:  Clear to auscultation  without rales, wheezing or rhonchi  ABDOMEN: Soft, non-tender, non-distended MUSCULOSKELETAL:  No edema; No deformity  SKIN: Warm and dry NEUROLOGIC:  Alert and oriented x 3 PSYCHIATRIC:  Normal affect   Signed, Jenean Lindau, MD  01/31/2018 4:48 PM    [facility]

## 2018-01-31 NOTE — Patient Instructions (Addendum)
Medication Instructions:  Your physician has recommended you make the following change in your medication:  INCREASE amiodrone 200 mg daily  Labwork: Your physician recommends that you have the following labs drawn: BMP and CBC  Testing/Procedures: Your physician has requested that you have an echocardiogram. Echocardiography is a painless test that uses sound waves to create images of your heart. It provides your doctor with information about the size and shape of your heart and how well your heart's chambers and valves are working. This procedure takes approximately one hour. There are no restrictions for this procedure.  Follow-Up: Your physician recommends that you schedule a follow-up appointment in: 6 months  EKG on Tuesday.  Any Other Special Instructions Will Be Listed Below (If Applicable).     If you need a refill on your cardiac medications before your next appointment, please call your pharmacy.   [place], RN, BSN

## 2018-02-01 ENCOUNTER — Telehealth: Payer: Self-pay | Admitting: Emergency Medicine

## 2018-02-01 ENCOUNTER — Telehealth: Payer: Self-pay

## 2018-02-01 LAB — BASIC METABOLIC PANEL
BUN/Creatinine Ratio: 17 (ref 12–28)
BUN: 15 mg/dL (ref 8–27)
CALCIUM: 9.7 mg/dL (ref 8.7–10.3)
CHLORIDE: 102 mmol/L (ref 96–106)
CO2: 23 mmol/L (ref 20–29)
Creatinine, Ser: 0.86 mg/dL (ref 0.57–1.00)
GFR calc Af Amer: 70 mL/min/{1.73_m2} (ref >59)
GFR, EST NON AFRICAN AMERICAN: 61 mL/min/{1.73_m2} (ref >59)
Glucose: 103 mg/dL — ABNORMAL HIGH (ref 65–99)
Potassium: 5 mmol/L (ref 3.5–5.2)
SODIUM: 139 mmol/L (ref 134–144)

## 2018-02-01 LAB — CBC WITH DIFFERENTIAL/PLATELET
BASOS ABS: 0 10*3/uL (ref 0.0–0.2)
BASOS: 0 %
EOS (ABSOLUTE): 0.1 10*3/uL (ref 0.0–0.4)
Eos: 2 %
Hematocrit: 29.7 % — ABNORMAL LOW (ref 34.0–46.6)
Hemoglobin: 8.9 g/dL — ABNORMAL LOW (ref 11.1–15.9)
Immature Grans (Abs): 0 10*3/uL (ref 0.0–0.1)
Immature Granulocytes: 1 %
LYMPHS: 16 %
Lymphocytes Absolute: 1 10*3/uL (ref 0.7–3.1)
MCH: 22.5 pg — AB (ref 26.6–33.0)
MCHC: 30 g/dL — ABNORMAL LOW (ref 31.5–35.7)
MCV: 75 fL — AB (ref 79–97)
MONOS ABS: 0.5 10*3/uL (ref 0.1–0.9)
Monocytes: 9 %
NEUTROS ABS: 4.4 10*3/uL (ref 1.4–7.0)
NEUTROS PCT: 72 %
PLATELETS: 285 10*3/uL (ref 150–450)
RBC: 3.96 x10E6/uL (ref 3.77–5.28)
RDW: 19.9 % — ABNORMAL HIGH (ref 12.3–15.4)
WBC: 6.1 10*3/uL (ref 3.4–10.8)

## 2018-02-01 LAB — SPECIMEN STATUS REPORT

## 2018-02-01 MED ORDER — AMIODARONE HCL 200 MG PO TABS: 200.0000 mg | ORAL_TABLET | Freq: Every day | ORAL | 1 refills | Status: DC

## 2018-02-01 NOTE — Telephone Encounter (Signed)
Informed patient of test results. Patient advised to stop xarelto, and follow up with primary care. Results have been sent to pcp. Will call to confirm.

## 2018-02-01 NOTE — Telephone Encounter (Signed)
Patient's sister called about her stopping her Xarelto wanting to know what she was supposed to take instead. I advised that she was supposed to be on 200 mg of Amiodarone. [facility] [address] [facility] as she did not currently have any. I also advised her to contact Dr. Lauralee Evener office about her anemia.

## 2018-02-05 ENCOUNTER — Ambulatory Visit: Payer: Medicare Other

## 2018-02-13 DIAGNOSIS — D509 Iron deficiency anemia, unspecified: Secondary | ICD-10-CM | POA: Diagnosis not present

## 2018-02-18 DIAGNOSIS — E663 Overweight: Secondary | ICD-10-CM | POA: Diagnosis not present

## 2018-02-18 DIAGNOSIS — Z6825 Body mass index (BMI) 25.0-25.9, adult: Secondary | ICD-10-CM | POA: Diagnosis not present

## 2018-02-18 DIAGNOSIS — K5900 Constipation, unspecified: Secondary | ICD-10-CM | POA: Diagnosis not present

## 2018-02-18 DIAGNOSIS — R1030 Lower abdominal pain, unspecified: Secondary | ICD-10-CM | POA: Diagnosis not present

## 2018-02-18 DIAGNOSIS — Z79899 Other long term (current) drug therapy: Secondary | ICD-10-CM | POA: Diagnosis not present

## 2018-02-21 ENCOUNTER — Telehealth: Payer: Self-pay | Admitting: Cardiology

## 2018-02-21 ENCOUNTER — Other Ambulatory Visit: Payer: Self-pay

## 2018-02-21 MED ORDER — AMIODARONE HCL 200 MG PO TABS: 200.0000 mg | ORAL_TABLET | Freq: Every day | ORAL | 2 refills | Status: DC

## 2018-02-21 NOTE — Telephone Encounter (Signed)
Med refill has been sent. 

## 2018-02-21 NOTE — Telephone Encounter (Signed)
Has felt like nothing since RRR took her off the xarelto

## 2018-02-21 NOTE — Telephone Encounter (Signed)
° ° ° °  1. Which medications need to be refilled? (please list name of each medication and dose if known) amiodarone 200mg   2. Which pharmacy/location (including street and city if local pharmacy) [facility] [address]   3. Do they need a 30 day or 90 day supply? 90 day supply

## 2018-02-21 NOTE — Telephone Encounter (Signed)
Please call at [phone]

## 2018-02-21 NOTE — Telephone Encounter (Signed)
Per [name] she did not remember why she had called and will call the office back when she has a moment. Number was provided to the patient.

## 2018-02-25 DIAGNOSIS — K449 Diaphragmatic hernia without obstruction or gangrene: Secondary | ICD-10-CM | POA: Diagnosis not present

## 2018-02-25 DIAGNOSIS — R109 Unspecified abdominal pain: Secondary | ICD-10-CM | POA: Diagnosis not present

## 2018-02-25 DIAGNOSIS — K5900 Constipation, unspecified: Secondary | ICD-10-CM | POA: Diagnosis not present

## 2018-02-25 DIAGNOSIS — R1084 Generalized abdominal pain: Secondary | ICD-10-CM | POA: Diagnosis not present

## 2018-02-25 DIAGNOSIS — J449 Chronic obstructive pulmonary disease, unspecified: Secondary | ICD-10-CM | POA: Diagnosis not present

## 2018-02-25 DIAGNOSIS — I4891 Unspecified atrial fibrillation: Secondary | ICD-10-CM | POA: Diagnosis not present

## 2018-02-25 DIAGNOSIS — Z8543 Personal history of malignant neoplasm of ovary: Secondary | ICD-10-CM | POA: Diagnosis not present

## 2018-02-25 DIAGNOSIS — A419 Sepsis, unspecified organism: Secondary | ICD-10-CM | POA: Diagnosis not present

## 2018-02-25 DIAGNOSIS — Z7902 Long term (current) use of antithrombotics/antiplatelets: Secondary | ICD-10-CM | POA: Diagnosis not present

## 2018-02-27 DIAGNOSIS — I4891 Unspecified atrial fibrillation: Secondary | ICD-10-CM | POA: Diagnosis not present

## 2018-02-27 DIAGNOSIS — Z6822 Body mass index (BMI) 22.0-22.9, adult: Secondary | ICD-10-CM | POA: Diagnosis not present

## 2018-02-27 DIAGNOSIS — D649 Anemia, unspecified: Secondary | ICD-10-CM | POA: Diagnosis not present

## 2018-02-27 DIAGNOSIS — Z23 Encounter for immunization: Secondary | ICD-10-CM | POA: Diagnosis not present

## 2018-03-05 DIAGNOSIS — D509 Iron deficiency anemia, unspecified: Secondary | ICD-10-CM | POA: Diagnosis not present

## 2018-03-12 DIAGNOSIS — D509 Iron deficiency anemia, unspecified: Secondary | ICD-10-CM | POA: Diagnosis not present

## 2018-03-19 ENCOUNTER — Other Ambulatory Visit: Payer: Self-pay

## 2018-03-19 ENCOUNTER — Ambulatory Visit (INDEPENDENT_AMBULATORY_CARE_PROVIDER_SITE_OTHER): Payer: Medicare Other

## 2018-03-19 DIAGNOSIS — I480 Paroxysmal atrial fibrillation: Secondary | ICD-10-CM | POA: Diagnosis not present

## 2018-03-19 NOTE — Progress Notes (Signed)
Complete echocardiogram has been performed.  Jimmy Cherrill Scrima RDCS, RVT 

## 2018-04-15 DIAGNOSIS — J449 Chronic obstructive pulmonary disease, unspecified: Secondary | ICD-10-CM | POA: Diagnosis not present

## 2018-04-15 DIAGNOSIS — I4891 Unspecified atrial fibrillation: Secondary | ICD-10-CM | POA: Diagnosis not present

## 2018-04-15 DIAGNOSIS — Z9181 History of falling: Secondary | ICD-10-CM | POA: Diagnosis not present

## 2018-04-15 DIAGNOSIS — I10 Essential (primary) hypertension: Secondary | ICD-10-CM | POA: Diagnosis not present

## 2018-04-15 DIAGNOSIS — Z6822 Body mass index (BMI) 22.0-22.9, adult: Secondary | ICD-10-CM | POA: Diagnosis not present

## 2018-04-15 DIAGNOSIS — Z23 Encounter for immunization: Secondary | ICD-10-CM | POA: Diagnosis not present

## 2018-04-19 ENCOUNTER — Other Ambulatory Visit: Payer: Self-pay

## 2018-05-01 DIAGNOSIS — R1030 Lower abdominal pain, unspecified: Secondary | ICD-10-CM | POA: Diagnosis not present

## 2018-05-01 DIAGNOSIS — Z6823 Body mass index (BMI) 23.0-23.9, adult: Secondary | ICD-10-CM | POA: Diagnosis not present

## 2018-05-01 DIAGNOSIS — R110 Nausea: Secondary | ICD-10-CM | POA: Diagnosis not present

## 2018-05-01 DIAGNOSIS — K5909 Other constipation: Secondary | ICD-10-CM | POA: Diagnosis not present

## 2018-05-23 DIAGNOSIS — D509 Iron deficiency anemia, unspecified: Secondary | ICD-10-CM | POA: Diagnosis not present

## 2018-07-16 DIAGNOSIS — J449 Chronic obstructive pulmonary disease, unspecified: Secondary | ICD-10-CM | POA: Diagnosis not present

## 2018-07-16 DIAGNOSIS — E785 Hyperlipidemia, unspecified: Secondary | ICD-10-CM | POA: Diagnosis not present

## 2018-07-16 DIAGNOSIS — Z79899 Other long term (current) drug therapy: Secondary | ICD-10-CM | POA: Diagnosis not present

## 2018-07-16 DIAGNOSIS — E559 Vitamin D deficiency, unspecified: Secondary | ICD-10-CM | POA: Diagnosis not present

## 2018-07-16 DIAGNOSIS — Z6823 Body mass index (BMI) 23.0-23.9, adult: Secondary | ICD-10-CM | POA: Diagnosis not present

## 2018-07-16 DIAGNOSIS — I10 Essential (primary) hypertension: Secondary | ICD-10-CM | POA: Diagnosis not present

## 2018-07-16 DIAGNOSIS — D509 Iron deficiency anemia, unspecified: Secondary | ICD-10-CM | POA: Diagnosis not present

## 2018-07-30 ENCOUNTER — Ambulatory Visit (INDEPENDENT_AMBULATORY_CARE_PROVIDER_SITE_OTHER): Payer: Medicare Other | Admitting: Cardiology

## 2018-07-30 ENCOUNTER — Encounter: Payer: Self-pay | Admitting: Cardiology

## 2018-07-30 VITALS — BP 120/62 | HR 72 | Ht 66.0 in | Wt 143.0 lb

## 2018-07-30 DIAGNOSIS — I714 Abdominal aortic aneurysm, without rupture, unspecified: Secondary | ICD-10-CM

## 2018-07-30 DIAGNOSIS — I4819 Other persistent atrial fibrillation: Secondary | ICD-10-CM | POA: Diagnosis not present

## 2018-07-30 DIAGNOSIS — I10 Essential (primary) hypertension: Secondary | ICD-10-CM

## 2018-07-30 DIAGNOSIS — E782 Mixed hyperlipidemia: Secondary | ICD-10-CM | POA: Diagnosis not present

## 2018-07-30 MED ORDER — DIGOXIN 125 MCG PO TABS: 0.1250 mg | ORAL_TABLET | ORAL | 3 refills | Status: DC

## 2018-07-30 NOTE — Addendum Note (Signed)
Addended by: Tarri Glenn on: 07/30/2018 03:55 PM   Modules accepted: Orders

## 2018-07-30 NOTE — Progress Notes (Signed)
Cardiology Office Note:    Date:  07/30/2018   ID:  Adriana Castro, DOB 1929/02/15, MRN [id number]  PCP:  Nicholos Johns, MD  Cardiologist:  Jenean Lindau, MD   Referring MD: Nicholos Johns, MD    ASSESSMENT:    1. Persistent atrial fibrillation   2. Abdominal aortic aneurysm without rupture ([place])   3. Essential hypertension   4. Mixed hyperlipidemia    PLAN:    In order of problems listed above:  1. I discussed my findings with the patient at extensive length.  Patient has atrial fibrillation.  I think it is persistent now.  She is not on anticoagulation because of unstable gait.  I also mentioned to the patient that because of the fact that she is persistently in A. fib with a significant dose of amiodarone, I am going to quit that medication.  I have initiated her on digoxin 0.125 mg on Monday Wednesday and Friday and I will check a digoxin level when she sees me in follow-up appointment in a month.  She is asymptomatic.  Her rates are fairly well controlled.  I think the digoxin will help her rate control better in view of her sedentary lifestyle.  Some lab work which was available to me was reviewed.  Her creatinine is 1.4.  Patient gives history of abdominal aortic aneurysm and requests an ultrasound and we will schedule I for her. 2. Follow-up appointment in a month or earlier if she has any significant concerns or symptoms.   Medication Adjustments/Labs and Tests Ordered: Current medicines are reviewed at length with the patient today.  Concerns regarding medicines are outlined above.  No orders of the defined types were placed in this encounter.  No orders of the defined types were placed in this encounter.    No chief complaint on file.    History of Present Illness:    Adriana Castro is a 85 y.o. female.  Patient has past medical history of essential hypertension, dyslipidemia, atrial fibrillation and abdominal aortic aneurysm.  She denies any problems at this time and  takes care of activities of daily living.  No chest pain orthopnea or PND.  She continues to take amiodarone 200 mg daily.  Past Medical History:  Diagnosis Date  . Anxiety   . Anxiety   . Arthritis   . COPD (chronic obstructive pulmonary disease) ([place])   . Dyslipidemia   . Dysrhythmia    ATRIAL FIBRILATION  . GERD (gastroesophageal reflux disease)   . Heart murmur   . Hypertension   . Irregular heart beat   . Leaky heart valve   . Mood disorder ([place])   . Ovarian cancer ([place])   . Shortness of breath     Past Surgical History:  Procedure Laterality Date  . BREAST BIOPSY    . CATARACT EXTRACTION, BILATERAL    . ORIF HUMERUS FRACTURE Left 11/01/2016   Procedure: OPEN REDUCTION INTERNAL FIXATION (ORIF) LEFT SUPRACONDYLAR HUMERUS AND MEDIAL EPICONDYLE;  Surgeon: Leandrew Koyanagi, MD;  Location: [facility];  Service: Orthopedics;  Laterality: Left;  . SHOULDER SURGERY Right    ?   2002     . SKIN CANCER EXCISION     X 5  . TONSILLECTOMY      Current Medications: Current Meds  Medication Sig  . amiodarone (PACERONE) 200 MG tablet Take 1 tablet (200 mg total) by mouth daily.  Marland Kitchen diltiazem (CARDIZEM) 120 MG tablet Take 120 mg by mouth daily.  . fluticasone (FLONASE)  50 MCG/ACT nasal spray Place 1 spray into both nostrils daily as needed for allergies.  Marland Kitchen ipratropium (ATROVENT) 0.02 % nebulizer solution Take 500 mcg by nebulization 2 (two) times daily.   . montelukast (SINGULAIR) 10 MG tablet Take 10 mg by mouth every evening.  . Multiple Vitamin (MULTI-VITAMINS) TABS Take 1 tablet by mouth daily.  . nitroGLYCERIN (NITROSTAT) 0.4 MG SL tablet Place 1 tablet (0.4 mg total) under the tongue every 5 (five) minutes as needed.  Marland Kitchen omeprazole (PRILOSEC) 20 MG capsule Take 20 mg by mouth every morning.  . simvastatin (ZOCOR) 10 MG tablet Take 10 mg by mouth every evening.  . Vitamin D, Ergocalciferol, (DRISDOL) 50000 units CAPS capsule Take 50,000 Units by mouth as needed. supplement  . vitamin E  400 UNIT capsule Take 800 Units by mouth daily.     Allergies:   Albuterol   Social History   Socioeconomic History  . Marital status: Widowed    Spouse name: Not on file  . Number of children: Not on file  . Years of education: Not on file  . Highest education level: Not on file  Occupational History  . Not on file  Social Needs  . Financial resource strain: Not on file  . Food insecurity:    Worry: Not on file    Inability: Not on file  . Transportation needs:    Medical: Not on file    Non-medical: Not on file  Tobacco Use  . Smoking status: Former Smoker    Last attempt to quit: 06/07/2012    Years since quitting: 6.1  . Smokeless tobacco: Never Used  Substance and Sexual Activity  . Alcohol use: No  . Drug use: No  . Sexual activity: Not on file  Lifestyle  . Physical activity:    Days per week: Not on file    Minutes per session: Not on file  . Stress: Not on file  Relationships  . Social connections:    Talks on phone: Not on file    Gets together: Not on file    Attends religious service: Not on file    Active member of club or organization: Not on file    Attends meetings of clubs or organizations: Not on file    Relationship status: Not on file  Other Topics Concern  . Not on file  Social History Narrative  . Not on file     Family History: The patient's family history includes Asthma in her father; Breast cancer in her sister; Diabetes in her brother and mother; Heart attack in her mother; Heart disease in her mother; Hypertension in her mother; Prostate cancer in her brother; Stroke in her brother and father.  ROS:   Please see the history of present illness.    All other systems reviewed and are negative.  EKGs/Labs/Other Studies Reviewed:    The following studies were reviewed today: EKG reveals atrial fibrillation in the differential diagnosis of possible atrial tachycardia.   Recent Labs: 01/31/2018: BUN 15; Creatinine, Ser 0.86;  Hemoglobin 8.9; Platelets 285; Potassium 5.0; Sodium 139  Recent Lipid Panel No results found for: CHOL, TRIG, HDL, CHOLHDL, VLDL, LDLCALC, LDLDIRECT  Physical Exam:    VS:  BP 120/62 (BP Location: Right Arm, Patient Position: Sitting, Cuff Size: Normal)   Pulse 72   Ht 5\' 6"  (1.676 m)   Wt 143 lb (64.9 kg)   SpO2 96%   BMI 23.08 kg/m     Wt Readings from Last  3 Encounters:  07/30/18 143 lb (64.9 kg)  01/31/18 143 lb (64.9 kg)  02/22/17 152 lb 1.9 oz (69 kg)     GEN: Patient is in no acute distress HEENT: Normal NECK: No JVD; No carotid bruits LYMPHATICS: No lymphadenopathy CARDIAC: Hear sounds regular, 2/6 systolic murmur at the apex. RESPIRATORY:  Clear to auscultation without rales, wheezing or rhonchi  ABDOMEN: Soft, non-tender, non-distended MUSCULOSKELETAL:  No edema; No deformity  SKIN: Warm and dry NEUROLOGIC:  Alert and oriented x 3 PSYCHIATRIC:  Normal affect   Signed, Jenean Lindau, MD  07/30/2018 2:07 PM    [place]

## 2018-07-30 NOTE — Patient Instructions (Signed)
Medication Instructions:  Your physician has recommended you make the following change in your medication: STOP: Amiodarone 220 mg daily   START: Digoxin 0.125 mg M-W-F  If you need a refill on your cardiac medications before your next appointment, please call your pharmacy.   Lab work: None  If you have labs (blood work) drawn today and your tests are completely normal, you will receive your results only by: Marland Kitchen MyChart Message (if you have MyChart) OR . A paper copy in the mail If you have any lab test that is abnormal or we need to change your treatment, we will call you to review the results.  Testing/Procedures: Your physician has requested that you have an abdominal aorta duplex. During this test, an ultrasound is used to evaluate the aorta. Allow 30 minutes for this exam. Do not eat after midnight the day before and avoid carbonated beverages   Follow-Up: At [facility], you and your health needs are our priority.  As part of our continuing mission to provide you with exceptional heart care, we have created designated Provider Care Teams.  These Care Teams include your primary Cardiologist (physician) and Advanced Practice Providers (APPs -  Physician Assistants and Nurse Practitioners) who all work together to provide you with the care you need, when you need it. You will need a follow up appointment in 1 months.  Please call our office 2 months in advance to schedule this appointment.  You may see No primary care provider on file. or another member of our [employer] Provider Team in [place]: Jenne Campus, MD . Shirlee More, MD  Any Other Special Instructions Will Be Listed Below (If Applicable).

## 2018-09-03 ENCOUNTER — Telehealth (INDEPENDENT_AMBULATORY_CARE_PROVIDER_SITE_OTHER): Payer: Medicare Other | Admitting: Cardiology

## 2018-09-03 ENCOUNTER — Other Ambulatory Visit: Payer: Self-pay

## 2018-09-03 ENCOUNTER — Encounter: Payer: Self-pay | Admitting: Cardiology

## 2018-09-03 VITALS — Ht 66.0 in | Wt 140.0 lb

## 2018-09-03 DIAGNOSIS — I4819 Other persistent atrial fibrillation: Secondary | ICD-10-CM

## 2018-09-03 DIAGNOSIS — I714 Abdominal aortic aneurysm, without rupture, unspecified: Secondary | ICD-10-CM

## 2018-09-03 DIAGNOSIS — C569 Malignant neoplasm of unspecified ovary: Secondary | ICD-10-CM

## 2018-09-03 DIAGNOSIS — I10 Essential (primary) hypertension: Secondary | ICD-10-CM

## 2018-09-03 MED ORDER — RIVAROXABAN 20 MG PO TABS: 20.0000 mg | ORAL_TABLET | Freq: Every day | ORAL | 3 refills | Status: DC

## 2018-09-03 NOTE — Progress Notes (Signed)
Virtual Visit via Telephone Note    Evaluation Performed:  Follow-up visit  This visit type was conducted due to national recommendations for restrictions regarding the COVID-19 Pandemic (e.g. social distancing).  This format is felt to be most appropriate for this patient at this time.  All issues noted in this document were discussed and addressed.  No physical exam was performed (except for noted visual exam findings with Video Visits).  Please refer to the patient's chart (MyChart message for video visits and phone note for telephone visits) for the patient's consent to telehealth for [facility].  Date:  09/03/2018   ID:  Adriana Castro, DOB [birth year], MRN [id number]  Patient Location:  [place]   Provider location:   [place]  PCP:  Adriana Johns, MD  Cardiologist:  No primary care provider on file.  Electrophysiologist:  None   Chief Complaint: Persistent atrial fibrillation  History of Present Illness:    Adriana Castro is a 85 y.o. female who presents via audio/video conferencing for a telehealth visit today.  Patient is a pleasant 85 year old female.  She has past medical history of atrial fibrillation.  She was on anticoagulation at one point but was significantly anemic and this was discontinued and she is seeing a hematologist for the same reason.  She denies any chest pain orthopnea or PND.  At the time of my evaluation, the patient is alert awake oriented and in no distress.  The patient does not have symptoms concerning for COVID-19 infection (fever, chills, cough, or new shortness of breath).    Prior CV studies:   The following studies were reviewed today:  Echocardiogram was reviewed and we will get her on the schedule for ultrasound of the abdomen once the viral situation resolves.  Past Medical History:  Diagnosis Date  . Anxiety   . Anxiety   . Arthritis   . COPD (chronic obstructive pulmonary disease) ([place])   . Dyslipidemia   . Dysrhythmia    ATRIAL  FIBRILATION  . GERD (gastroesophageal reflux disease)   . Heart murmur   . Hypertension   . Irregular heart beat   . Leaky heart valve   . Mood disorder ([place])   . Ovarian cancer ([place])   . Shortness of breath    Past Surgical History:  Procedure Laterality Date  . BREAST BIOPSY    . CATARACT EXTRACTION, BILATERAL    . ORIF HUMERUS FRACTURE Left 11/01/2016   Procedure: OPEN REDUCTION INTERNAL FIXATION (ORIF) LEFT SUPRACONDYLAR HUMERUS AND MEDIAL EPICONDYLE;  Surgeon: Leandrew Koyanagi, MD;  Location: [facility];  Service: Orthopedics;  Laterality: Left;  . SHOULDER SURGERY Right    ?   2002     . SKIN CANCER EXCISION     X 5  . TONSILLECTOMY       Current Meds  Medication Sig  . digoxin (LANOXIN) 0.125 MG tablet Take 1 tablet (0.125 mg total) by mouth every Monday, Wednesday, and Friday.  . diltiazem (CARDIZEM) 120 MG tablet Take 120 mg by mouth daily.  . fluticasone (FLONASE) 50 MCG/ACT nasal spray Place 1 spray into both nostrils daily as needed for allergies.  Marland Kitchen ipratropium (ATROVENT) 0.02 % nebulizer solution Take 500 mcg by nebulization 2 (two) times daily.   . montelukast (SINGULAIR) 10 MG tablet Take 10 mg by mouth every evening.  . Multiple Vitamin (MULTI-VITAMINS) TABS Take 1 tablet by mouth daily.  . nitroGLYCERIN (NITROSTAT) 0.4 MG SL tablet Place 1 tablet (0.4 mg total) under the tongue  every 5 (five) minutes as needed.  Marland Kitchen omeprazole (PRILOSEC) 20 MG capsule Take 20 mg by mouth every morning.  . simvastatin (ZOCOR) 10 MG tablet Take 10 mg by mouth every evening.  . Vitamin D, Ergocalciferol, (DRISDOL) 50000 units CAPS capsule Take 50,000 Units by mouth as needed. supplement  . vitamin E 400 UNIT capsule Take 800 Units by mouth daily.     Allergies:   Albuterol   Social History   Tobacco Use  . Smoking status: Former Smoker    Last attempt to quit: 06/07/2012    Years since quitting: 6.2  . Smokeless tobacco: Never Used  Substance Use Topics  . Alcohol use: No  . Drug  use: No     Family Hx: The patient's family history includes Asthma in her father; Breast cancer in her sister; Diabetes in her brother and mother; Heart attack in her mother; Heart disease in her mother; Hypertension in her mother; Prostate cancer in her brother; Stroke in her brother and father.  ROS:   Please see the history of present illness.    Review of systems was negative. All other systems reviewed and are negative.   Labs/Other Tests and Data Reviewed:    Recent Labs: 01/31/2018: BUN 15; Creatinine, Ser 0.86; Hemoglobin 8.9; Platelets 285; Potassium 5.0; Sodium 139   Recent Lipid Panel No results found for: CHOL, TRIG, HDL, CHOLHDL, LDLCALC, LDLDIRECT  Wt Readings from Last 3 Encounters:  09/03/18 140 lb (63.5 kg)  07/30/18 143 lb (64.9 kg)  01/31/18 143 lb (64.9 kg)     Objective:    Vital Signs:  Ht 5\' 6"  (1.676 m)   Wt 140 lb (63.5 kg)   BMI 22.60 kg/m    Well nourished, well developed female in no acute distress.   ASSESSMENT & PLAN:    1.  Persistent atrial fibrillation: The patient continues to be in atrial fibrillation and the heart rates are well controlled.  She is asymptomatic life appropriate for her age.  She denies any dizziness or any such issues.  Her anemia issues are followed by her hematologist.  Would like to get in touch with her hematologist to understand anticoagulation and anemia issues.  We have left a voicemail and awaiting to hear from them. 2.  Her blood pressure is stable.  She will be seen in follow-up appointment in 3 months or earlier if she has any concerns.  COVID-19 Education: The signs and symptoms of COVID-19 were discussed with the patient and how to seek care for testing (follow up with PCP or arrange E-visit).  The importance of social distancing was discussed today.  Patient Risk:   After full review of this patient's clinical status, I feel that they are at least moderate risk at this time.  Time:   Today, I have spent  20 minutes with the patient with telehealth technology discussing.  Total time was 35 minutes.  This included reviewing her chart at length and discussing various issues related to her visit by telephone today..     Medication Adjustments/Labs and Tests Ordered: Current medicines are reviewed at length with the patient today.  Concerns regarding medicines are outlined above.  Tests Ordered: No orders of the defined types were placed in this encounter.  Medication Changes: No orders of the defined types were placed in this encounter.   Disposition:  Follow up in 3 month(s)  Signed, Jenean Lindau, MD  09/03/2018 10:02 AM    [place]

## 2018-09-03 NOTE — Addendum Note (Signed)
Addended by: Beckey Rutter on: 09/03/2018 05:02 PM   Modules accepted: Orders

## 2018-09-03 NOTE — Progress Notes (Signed)
I had an extensive discussion with the patient's primary care physician and her hematologist.  On the past 2 occasions the patient's hemoglobin has been greater than 14.  For this reason we will initiate the patient on Xarelto 20 mg daily.  We will get her back in 4 to 6 weeks for follow-up hemoglobin check and a Chem-7.  Benefits and potential risks of anticoagulation again visited with her and she vocalized understanding.  Questions were answered to her satisfaction.

## 2018-09-03 NOTE — Patient Instructions (Addendum)
Medication Instructions: Your physician has recommended you make the following change in your medication: START taking xarelto 20 mg (1 tablet) once daily  If you need a refill on your cardiac medications before your next appointment, please call your pharmacy.   Lab work: NONE today, You will need to walk into office in 1 mo between 8-4 with exception of 12-1 to have a BMP and CBC drawn. You will need to fast for these labs. You will need to fast for these labs.  If you have labs (blood work) drawn today and your tests are completely normal, you will receive your results only by: Marland Kitchen MyChart Message (if you have MyChart) OR . A paper copy in the mail If you have any lab test that is abnormal or we need to change your treatment, we will call you to review the results.  Testing/Procedures: NONE  Follow-Up: At [facility], you and your health needs are our priority.  As part of our continuing mission to provide you with exceptional heart care, we have created designated Provider Care Teams.  These Care Teams include your primary Cardiologist (physician) and Advanced Practice Providers (APPs -  Physician Assistants and Nurse Practitioners) who all work together to provide you with the care you need, when you need it. You will need a follow up appointment in 3 months.   Any Other Special Instructions Will Be Listed Below  Rivaroxaban oral tablets What is this medicine? RIVAROXABAN (ri va ROX a ban) is an anticoagulant (blood thinner). It is used to treat blood clots in the lungs or in the veins. It is also used after knee or hip surgeries to prevent blood clots. It is also used to lower the chance of stroke in people with a medical condition called atrial fibrillation. This medicine may be used for other purposes; ask your health care provider or pharmacist if you have questions. COMMON BRAND NAME(S): Xarelto, Xarelto Starter Pack What should I tell my health care provider before I take  this medicine? They need to know if you have any of these conditions: -bleeding disorders -bleeding in the brain -blood in your stools (black or tarry stools) or if you have blood in your vomit -history of stomach bleeding -kidney disease -liver disease -low blood counts, like low white cell, platelet, or red cell counts -recent or planned spinal or epidural procedure -take medicines that treat or prevent blood clots -an unusual or allergic reaction to rivaroxaban, other medicines, foods, dyes, or preservatives -pregnant or trying to get pregnant -breast-feeding How should I use this medicine? Take this medicine by mouth with a glass of water. Follow the directions on the prescription label. Take your medicine at regular intervals. Do not take it more often than directed. Do not stop taking except on your doctor's advice. Stopping this medicine may increase your risk of a blood clot. Be sure to refill your prescription before you run out of medicine. If you are taking this medicine after hip or knee replacement surgery, take it with or without food. If you are taking this medicine for atrial fibrillation, take it with your evening meal. If you are taking this medicine to treat blood clots, take it with food at the same time each day. If you are unable to swallow your tablet, you may crush the tablet and mix it in applesauce. Then, immediately eat the applesauce. You should eat more food right after you eat the applesauce containing the crushed tablet. Talk to your pediatrician regarding the  use of this medicine in children. Special care may be needed. Overdosage: If you think you have taken too much of this medicine contact a poison control center or emergency room at once. NOTE: This medicine is only for you. Do not share this medicine with others. What if I miss a dose? If you take your medicine once a day and miss a dose, take the missed dose as soon as you remember. If it is almost time for  your next dose, take only that dose. Do not take double or extra doses. If you take your medicine twice a day and miss a dose, take the missed dose immediately. In this instance, 2 tablets may be taken at the same time. The next day you should take 1 tablet twice a day as directed. What may interact with this medicine? Do not take this medicine with any of the following medications: -defibrotide This medicine may also interact with the following medications: -aspirin and aspirin-like medicines -certain antibiotics like erythromycin, azithromycin, and clarithromycin -certain medicines for fungal infections like ketoconazole and itraconazole -certain medicines for irregular heart beat like amiodarone, quinidine, dronedarone -certain medicines for seizures like carbamazepine, phenytoin -certain medicines that treat or prevent blood clots like warfarin, enoxaparin, and dalteparin -conivaptan -felodipine -indinavir -lopinavir; ritonavir -NSAIDS, medicines for pain and inflammation, like ibuprofen or naproxen -ranolazine -rifampin -ritonavir -SNRIs, medicines for depression, like desvenlafaxine, duloxetine, levomilnacipran, venlafaxine -SSRIs, medicines for depression, like citalopram, escitalopram, fluoxetine, fluvoxamine, paroxetine, sertraline -St. John's wort -verapamil This list may not describe all possible interactions. Give your health care provider a list of all the medicines, herbs, non-prescription drugs, or dietary supplements you use. Also tell them if you smoke, drink alcohol, or use illegal drugs. Some items may interact with your medicine. What should I watch for while using this medicine? Visit your healthcare professional for regular checks on your progress. You may need blood work done while you are taking this medicine. Your condition will be monitored carefully while you are receiving this medicine. It is important not to miss any appointments. Avoid sports and activities  that might cause injury while you are using this medicine. Severe falls or injuries can cause unseen bleeding. Be careful when using sharp tools or knives. Consider using an Copy. Take special care brushing or flossing your teeth. Report any injuries, bruising, or red spots on the skin to your healthcare professional. If you are going to need surgery or other procedure, tell your healthcare professional that you are taking this medicine. Wear a medical ID bracelet or chain. Carry a card that describes your disease and details of your medicine and dosage times. What side effects may I notice from receiving this medicine? Side effects that you should report to your doctor or health care professional as soon as possible: -allergic reactions like skin rash, itching or hives, swelling of the face, lips, or tongue -back pain -redness, blistering, peeling or loosening of the skin, including inside the mouth -signs and symptoms of bleeding such as bloody or black, tarry stools; red or dark-brown urine; spitting up blood or brown material that looks like coffee grounds; red spots on the skin; unusual bruising or bleeding from the eye, gums, or nose -signs and symptoms of a blood clot such as chest pain; shortness of breath; pain, swelling, or warmth in the leg -signs and symptoms of a stroke such as changes in vision; confusion; trouble speaking or understanding; severe headaches; sudden numbness or weakness of the face, arm or leg;  trouble walking; dizziness; loss of coordination Side effects that usually do not require medical attention (report to your doctor or health care professional if they continue or are bothersome): -dizziness -muscle pain This list may not describe all possible side effects. Call your doctor for medical advice about side effects. You may report side effects to FDA at 1-800-FDA-1088. Where should I keep my medicine? Keep out of the reach of children. Store at room  temperature between 15 and 30 degrees C (59 and 86 degrees F). Throw away any unused medicine after the expiration date. NOTE: This sheet is a summary. It may not cover all possible information. If you have questions about this medicine, talk to your doctor, pharmacist, or health care provider.  2019 Elsevier/Gold Standard (2017-05-17 11:37:12)

## 2018-09-05 ENCOUNTER — Other Ambulatory Visit: Payer: Medicare Other

## 2018-09-06 ENCOUNTER — Ambulatory Visit: Payer: Medicare Other | Admitting: Cardiology

## 2018-11-14 DIAGNOSIS — I10 Essential (primary) hypertension: Secondary | ICD-10-CM | POA: Diagnosis not present

## 2018-11-14 DIAGNOSIS — E785 Hyperlipidemia, unspecified: Secondary | ICD-10-CM | POA: Diagnosis not present

## 2018-11-14 DIAGNOSIS — Z6823 Body mass index (BMI) 23.0-23.9, adult: Secondary | ICD-10-CM | POA: Diagnosis not present

## 2018-11-14 DIAGNOSIS — I4891 Unspecified atrial fibrillation: Secondary | ICD-10-CM | POA: Diagnosis not present

## 2018-11-15 DIAGNOSIS — D509 Iron deficiency anemia, unspecified: Secondary | ICD-10-CM | POA: Diagnosis not present

## 2018-11-18 ENCOUNTER — Other Ambulatory Visit: Payer: Self-pay

## 2018-11-18 MED ORDER — DIGOXIN 125 MCG PO TABS: 0.1250 mg | ORAL_TABLET | ORAL | 1 refills | Status: DC

## 2018-11-27 ENCOUNTER — Other Ambulatory Visit: Payer: Self-pay | Admitting: Cardiology

## 2018-11-27 NOTE — Telephone Encounter (Signed)
xarelto refill sent

## 2018-12-03 ENCOUNTER — Ambulatory Visit: Payer: Medicare Other | Admitting: Cardiology

## 2018-12-05 ENCOUNTER — Encounter: Payer: Self-pay | Admitting: Cardiology

## 2018-12-05 ENCOUNTER — Other Ambulatory Visit: Payer: Self-pay

## 2018-12-05 ENCOUNTER — Ambulatory Visit (INDEPENDENT_AMBULATORY_CARE_PROVIDER_SITE_OTHER): Payer: Medicare Other | Admitting: Cardiology

## 2018-12-05 ENCOUNTER — Telehealth: Payer: Self-pay

## 2018-12-05 VITALS — BP 116/72 | HR 93 | Ht 66.0 in | Wt 144.0 lb

## 2018-12-05 DIAGNOSIS — I4891 Unspecified atrial fibrillation: Secondary | ICD-10-CM

## 2018-12-05 DIAGNOSIS — E782 Mixed hyperlipidemia: Secondary | ICD-10-CM | POA: Diagnosis not present

## 2018-12-05 DIAGNOSIS — I714 Abdominal aortic aneurysm, without rupture, unspecified: Secondary | ICD-10-CM

## 2018-12-05 DIAGNOSIS — E559 Vitamin D deficiency, unspecified: Secondary | ICD-10-CM | POA: Diagnosis not present

## 2018-12-05 DIAGNOSIS — Z79899 Other long term (current) drug therapy: Secondary | ICD-10-CM | POA: Diagnosis not present

## 2018-12-05 DIAGNOSIS — I4819 Other persistent atrial fibrillation: Secondary | ICD-10-CM | POA: Diagnosis not present

## 2018-12-05 DIAGNOSIS — I10 Essential (primary) hypertension: Secondary | ICD-10-CM | POA: Diagnosis not present

## 2018-12-05 DIAGNOSIS — Z5181 Encounter for therapeutic drug level monitoring: Secondary | ICD-10-CM

## 2018-12-05 DIAGNOSIS — Z1329 Encounter for screening for other suspected endocrine disorder: Secondary | ICD-10-CM | POA: Diagnosis not present

## 2018-12-05 MED ORDER — DILTIAZEM HCL 120 MG PO TABS: 120.0000 mg | ORAL_TABLET | Freq: Every day | ORAL | 2 refills | Status: DC

## 2018-12-05 MED ORDER — SIMVASTATIN 10 MG PO TABS: 10.0000 mg | ORAL_TABLET | Freq: Every evening | ORAL | 2 refills | Status: DC

## 2018-12-05 NOTE — Telephone Encounter (Signed)
There is possible hepatic damage when combining diltiazem and simvastatin. Pt had baseline labs drawn today but information referred to Dr. Docia Furl for advisement.

## 2018-12-05 NOTE — Telephone Encounter (Signed)
Change the patient medication from simvastatin to atorvastatin half the strength of dose of simvastatin

## 2018-12-05 NOTE — Progress Notes (Signed)
Cardiology Office Note:    Date:  12/05/2018   ID:  Adriana Castro, DOB 1928-11-03, MRN [id number]  PCP:  Nicholos Johns, MD  Cardiologist:  Jenean Lindau, MD   Referring MD: Nicholos Johns, MD    ASSESSMENT:    1. Essential hypertension   2. Abdominal aortic aneurysm without rupture ([place])   3. Atrial fibrillation, unspecified type ([place])   4. Mixed hyperlipidemia   5. Vitamin D deficiency   6. Encounter for monitoring digoxin therapy   7. Thyroid disorder screen   8. Persistent atrial fibrillation    PLAN:    In order of problems listed above:  1. Persistent atrial fibrillation:I discussed with the patient atrial fibrillation, disease process. Management and therapy including rate and rhythm control, anticoagulation benefits and potential risks were discussed extensively with the patient. Patient had multiple questions which were answered to patient's satisfaction. 2. She will have blood work today including CBC.  We will also assess her hemoglobin.  TSH will be checked and vitamin D too. 3. Essential hypertension: Blood pressure stable 4. She will be seen in follow-up appointment in a month or earlier if she has any concerns.  Medication Adjustments/Labs and Tests Ordered: Current medicines are reviewed at length with the patient today.  Concerns regarding medicines are outlined above.  Orders Placed This Encounter  Procedures  . Basic Metabolic Panel (BMET)  . TSH  . Digoxin level  . Vitamin D (25 hydroxy)  . Lipid Profile  . Hepatic function panel  . Basic Metabolic Panel (BMET)  . EKG 12-Lead   No orders of the defined types were placed in this encounter.    No chief complaint on file.    History of Present Illness:    Adriana Castro is a 85 y.o. female with past medical history of essential hypertension, persistent atrial fibrillation, mitral regurgitation.  She denies any problems at this time and takes care of activities of daily living.  No chest pain orthopnea  or PND.  She was initiated on digoxin and feels much better.  She mentions to me that she has continued with her anticoagulation without any problems.  She has not noticed any blood in the stools or any such issues.  At the time of my evaluation, the patient is alert awake oriented and in no distress.  Past Medical History:  Diagnosis Date  . Anxiety   . Anxiety   . Arthritis   . COPD (chronic obstructive pulmonary disease) ([place])   . Dyslipidemia   . Dysrhythmia    ATRIAL FIBRILATION  . GERD (gastroesophageal reflux disease)   . Heart murmur   . Hypertension   . Irregular heart beat   . Leaky heart valve   . Mood disorder ([place])   . Ovarian cancer ([place])   . Shortness of breath     Past Surgical History:  Procedure Laterality Date  . BREAST BIOPSY    . CATARACT EXTRACTION, BILATERAL    . ORIF HUMERUS FRACTURE Left 11/01/2016   Procedure: OPEN REDUCTION INTERNAL FIXATION (ORIF) LEFT SUPRACONDYLAR HUMERUS AND MEDIAL EPICONDYLE;  Surgeon: Leandrew Koyanagi, MD;  Location: [place];  Service: Orthopedics;  Laterality: Left;  . SHOULDER SURGERY Right    ?   2002     . SKIN CANCER EXCISION     X 5  . TONSILLECTOMY      Current Medications: Current Meds  Medication Sig  . digoxin (LANOXIN) 0.125 MG tablet Take 1 tablet (0.125 mg total) by  mouth every Monday, Wednesday, and Friday.  . diltiazem (CARDIZEM) 120 MG tablet Take 120 mg by mouth daily.  . fluticasone (FLONASE) 50 MCG/ACT nasal spray Place 1 spray into both nostrils daily as needed for allergies.  Marland Kitchen ipratropium (ATROVENT) 0.02 % nebulizer solution Take 500 mcg by nebulization 2 (two) times daily.   . montelukast (SINGULAIR) 10 MG tablet Take 10 mg by mouth every evening.  . Multiple Vitamin (MULTI-VITAMINS) TABS Take 1 tablet by mouth daily.  . nitroGLYCERIN (NITROSTAT) 0.4 MG SL tablet Place 1 tablet (0.4 mg total) under the tongue every 5 (five) minutes as needed.  Marland Kitchen omeprazole (PRILOSEC) 20 MG capsule Take 20 mg by mouth every  morning.  . simvastatin (ZOCOR) 10 MG tablet Take 10 mg by mouth every evening.  . Vitamin D, Ergocalciferol, (DRISDOL) 50000 units CAPS capsule Take 50,000 Units by mouth as needed. supplement  . vitamin E 400 UNIT capsule Take 800 Units by mouth daily.  Alveda Reasons 20 MG TABS tablet TAKE 1 TABLET (20 MG TOTAL) BY MOUTH DAILY WITH SUPPER.     Allergies:   Albuterol   Social History   Socioeconomic History  . Marital status: Widowed    Spouse name: Not on file  . Number of children: Not on file  . Years of education: Not on file  . Highest education level: Not on file  Occupational History  . Not on file  Social Needs  . Financial resource strain: Not on file  . Food insecurity    Worry: Not on file    Inability: Not on file  . Transportation needs    Medical: Not on file    Non-medical: Not on file  Tobacco Use  . Smoking status: Former Smoker    Quit date: 06/07/2012    Years since quitting: 6.4  . Smokeless tobacco: Never Used  Substance and Sexual Activity  . Alcohol use: No  . Drug use: No  . Sexual activity: Not on file  Lifestyle  . Physical activity    Days per week: Not on file    Minutes per session: Not on file  . Stress: Not on file  Relationships  . Social Herbalist on phone: Not on file    Gets together: Not on file    Attends religious service: Not on file    Active member of club or organization: Not on file    Attends meetings of clubs or organizations: Not on file    Relationship status: Not on file  Other Topics Concern  . Not on file  Social History Narrative  . Not on file     Family History: The patient's family history includes Asthma in her father; Breast cancer in her sister; Diabetes in her brother and mother; Heart attack in her mother; Heart disease in her mother; Hypertension in her mother; Prostate cancer in her brother; Stroke in her brother and father.  ROS:   Please see the history of present illness.    All other  systems reviewed and are negative.  EKGs/Labs/Other Studies Reviewed:    The following studies were reviewed today: EKG done today reveals atrial fibrillation with a fairly well-controlled ventricular rate   Recent Labs: 01/31/2018: BUN 15; Creatinine, Ser 0.86; Hemoglobin 8.9; Platelets 285; Potassium 5.0; Sodium 139  Recent Lipid Panel No results found for: CHOL, TRIG, HDL, CHOLHDL, VLDL, LDLCALC, LDLDIRECT  Physical Exam:    VS:  BP 116/72 (BP Location: Left Arm, Patient Position:  Sitting, Cuff Size: Normal)   Pulse 93   Ht 5\' 6"  (1.676 m)   Wt 144 lb (65.3 kg)   SpO2 95%   BMI 23.24 kg/m     Wt Readings from Last 3 Encounters:  12/05/18 144 lb (65.3 kg)  09/03/18 140 lb (63.5 kg)  07/30/18 143 lb (64.9 kg)     GEN: Patient is in no acute distress HEENT: Normal NECK: No JVD; No carotid bruits LYMPHATICS: No lymphadenopathy CARDIAC: Hear sounds regular, 2/6 systolic murmur at the apex. RESPIRATORY:  Clear to auscultation without rales, wheezing or rhonchi  ABDOMEN: Soft, non-tender, non-distended MUSCULOSKELETAL:  No edema; No deformity  SKIN: Warm and dry NEUROLOGIC:  Alert and oriented x 3 PSYCHIATRIC:  Normal affect   Signed, Jenean Lindau, MD  12/05/2018 11:40 AM    [place]

## 2018-12-05 NOTE — Patient Instructions (Signed)
Medication Instructions:  Your physician recommends that you continue on your current medications as directed. Please refer to the Current Medication list given to you today. If you need a refill on your cardiac medications before your next appointment, please call your pharmacy.   Lab work: Your physician recommends that you have a BMP, CBC, TSH, LFT , Lipid, dig level and vitamin D Drawn.   If you have labs (blood work) drawn today and your tests are completely normal, you will receive your results only by: Marland Kitchen MyChart Message (if you have MyChart) OR . A paper copy in the mail If you have any lab test that is abnormal or we need to change your treatment, we will call you to review the results.  Testing/Procedures: You had an EKG performed today.  Follow-Up: At [facility], you and your health needs are our priority.  As part of our continuing mission to provide you with exceptional heart care, we have created designated Provider Care Teams.  These Care Teams include your primary Cardiologist (physician) and Advanced Practice Providers (APPs -  Physician Assistants and Nurse Practitioners) who all work together to provide you with the care you need, when you need it. You will need a follow up appointment in 1 months.

## 2018-12-06 LAB — LIPID PANEL
Chol/HDL Ratio: 4.1 ratio (ref 0.0–4.4)
Cholesterol, Total: 215 mg/dL — ABNORMAL HIGH (ref 100–199)
HDL: 52 mg/dL (ref >39)
LDL Calculated: 130 mg/dL — ABNORMAL HIGH (ref 0–99)
Triglycerides: 166 mg/dL — ABNORMAL HIGH (ref 0–149)
VLDL Cholesterol Cal: 33 mg/dL (ref 5–40)

## 2018-12-06 LAB — HEPATIC FUNCTION PANEL
ALT: 21 IU/L (ref 0–32)
AST: 21 IU/L (ref 0–40)
Albumin: 4.2 g/dL (ref 3.6–4.6)
Alkaline Phosphatase: 82 IU/L (ref 39–117)
Bilirubin Total: 0.3 mg/dL (ref 0.0–1.2)
Bilirubin, Direct: 0.11 mg/dL (ref 0.00–0.40)
Total Protein: 6.3 g/dL (ref 6.0–8.5)

## 2018-12-06 LAB — BASIC METABOLIC PANEL
BUN/Creatinine Ratio: 24 (ref 12–28)
BUN: 24 mg/dL (ref 8–27)
CO2: 25 mmol/L (ref 20–29)
Calcium: 10.2 mg/dL (ref 8.7–10.3)
Chloride: 102 mmol/L (ref 96–106)
Creatinine, Ser: 0.99 mg/dL (ref 0.57–1.00)
GFR calc Af Amer: 59 mL/min/{1.73_m2} — ABNORMAL LOW (ref >59)
GFR calc non Af Amer: 51 mL/min/{1.73_m2} — ABNORMAL LOW (ref >59)
Glucose: 115 mg/dL — ABNORMAL HIGH (ref 65–99)
Potassium: 5 mmol/L (ref 3.5–5.2)
Sodium: 142 mmol/L (ref 134–144)

## 2018-12-06 LAB — TSH: TSH: 2.58 u[IU]/mL (ref 0.450–4.500)

## 2018-12-06 LAB — VITAMIN D 25 HYDROXY (VIT D DEFICIENCY, FRACTURES): Vit D, 25-Hydroxy: 51.7 ng/mL (ref 30.0–100.0)

## 2018-12-06 LAB — DIGOXIN LEVEL: Digoxin, Serum: 0.5 ng/mL (ref 0.5–0.9)

## 2018-12-09 MED ORDER — ATORVASTATIN CALCIUM 10 MG PO TABS: 5.0000 mg | ORAL_TABLET | Freq: Every day | ORAL | 4 refills | Status: DC

## 2018-12-09 NOTE — Telephone Encounter (Signed)
Left message advising pharmacy staff that medication has been changed from simvastatin to atorvastatin.

## 2018-12-09 NOTE — Addendum Note (Signed)
Addended by: Beckey Rutter on: 12/09/2018 08:31 AM   Modules accepted: Orders

## 2018-12-12 ENCOUNTER — Telehealth: Payer: Self-pay

## 2018-12-12 NOTE — Telephone Encounter (Signed)
Information relayed to patient and she will start taking atorvastatin 10 mg (1 tablet) once daily. Patient will come into office last week in Aug 2020 for repeat labs. She states she has been eating a lot of sugary foods and will cut down today. Copy of results sent to Dr. Rica Records per Dr. Docia Furl request.

## 2018-12-12 NOTE — Telephone Encounter (Signed)
-----   Message from Jenean Lindau, MD sent at 12/09/2018  1:31 PM EDT ----- Double statin and liver lipid check in 6 weeks.  Please send copy of labs to primary care ASAP. Jenean Lindau, MD 12/09/2018 1:31 PM

## 2019-01-08 ENCOUNTER — Encounter: Payer: Self-pay | Admitting: Cardiology

## 2019-01-08 ENCOUNTER — Other Ambulatory Visit: Payer: Self-pay

## 2019-01-08 ENCOUNTER — Telehealth: Payer: Self-pay

## 2019-01-08 ENCOUNTER — Telehealth (INDEPENDENT_AMBULATORY_CARE_PROVIDER_SITE_OTHER): Payer: Medicare Other | Admitting: Cardiology

## 2019-01-08 ENCOUNTER — Ambulatory Visit: Payer: Medicare Other | Admitting: Cardiology

## 2019-01-08 VITALS — BP 149/62 | HR 86 | Ht 66.0 in | Wt 135.0 lb

## 2019-01-08 DIAGNOSIS — E782 Mixed hyperlipidemia: Secondary | ICD-10-CM

## 2019-01-08 DIAGNOSIS — I4819 Other persistent atrial fibrillation: Secondary | ICD-10-CM

## 2019-01-08 NOTE — Progress Notes (Signed)
Virtual Visit via Telephone Note   This visit type was conducted due to national recommendations for restrictions regarding the COVID-19 Pandemic (e.g. social distancing) in an effort to limit this patient's exposure and mitigate transmission in our community.  Due to her co-morbid illnesses, this patient is at least at moderate risk for complications without adequate follow up.  This format is felt to be most appropriate for this patient at this time.  The patient did not have access to video technology/had technical difficulties with video requiring transitioning to audio format only (telephone).  All issues noted in this document were discussed and addressed.  No physical exam could be performed with this format.  Please refer to the patient's chart for her  consent to telehealth for [facility].   Date:  01/08/2019   ID:  Adriana Castro, DOB 1928-09-19, MRN [id number]  Patient Location: Home Provider Location: Office  PCP:  Nicholos Johns, MD  Cardiologist:  No primary care provider on file.  Electrophysiologist:  None   Evaluation Performed:  Follow-Up Visit  Chief Complaint: Atrial fibrillation  History of Present Illness:    Adriana Castro is a 85 y.o. female with past medical history of essential hypertension, mixed dyslipidemia and atrial fibrillation.  She denies any problems at this time and takes care of activities of daily living.  No chest pain orthopnea or PND.  She mentions to me that ever she has been on digoxin she is lost her appetite and sometimes has nausea.  No dizziness no syncope no palpitations or any such symptoms.  She would rather like to stop it.  The patient does not have symptoms concerning for COVID-19 infection (fever, chills, cough, or new shortness of breath).    Past Medical History:  Diagnosis Date  . Anxiety   . Anxiety   . Arthritis   . COPD (chronic obstructive pulmonary disease) ([place])   . Dyslipidemia   . Dysrhythmia    ATRIAL FIBRILATION  .  GERD (gastroesophageal reflux disease)   . Heart murmur   . Hypertension   . Irregular heart beat   . Leaky heart valve   . Mood disorder ([place])   . Ovarian cancer ([place])   . Shortness of breath    Past Surgical History:  Procedure Laterality Date  . BREAST BIOPSY    . CATARACT EXTRACTION, BILATERAL    . ORIF HUMERUS FRACTURE Left 11/01/2016   Procedure: OPEN REDUCTION INTERNAL FIXATION (ORIF) LEFT SUPRACONDYLAR HUMERUS AND MEDIAL EPICONDYLE;  Surgeon: Leandrew Koyanagi, MD;  Location: [facility];  Service: Orthopedics;  Laterality: Left;  . SHOULDER SURGERY Right    ?   2002     . SKIN CANCER EXCISION     X 5  . TONSILLECTOMY       Current Meds  Medication Sig  . digoxin (LANOXIN) 0.125 MG tablet Take 1 tablet (0.125 mg total) by mouth every Monday, Wednesday, and Friday.  . diltiazem (CARDIZEM) 120 MG tablet Take 1 tablet (120 mg total) by mouth daily.  . fluticasone (FLONASE) 50 MCG/ACT nasal spray Place 1 spray into both nostrils daily as needed for allergies.  Marland Kitchen ipratropium (ATROVENT) 0.02 % nebulizer solution Take 500 mcg by nebulization 2 (two) times daily.   . montelukast (SINGULAIR) 10 MG tablet Take 10 mg by mouth every evening.  . Multiple Vitamin (MULTI-VITAMINS) TABS Take 1 tablet by mouth daily.  . nitroGLYCERIN (NITROSTAT) 0.4 MG SL tablet Place 1 tablet (0.4 mg total) under the tongue every  5 (five) minutes as needed.  Marland Kitchen omeprazole (PRILOSEC) 20 MG capsule Take 20 mg by mouth every morning.  . Vitamin D, Ergocalciferol, (DRISDOL) 50000 units CAPS capsule Take 50,000 Units by mouth as needed. supplement  . vitamin E 400 UNIT capsule Take 800 Units by mouth daily.  Alveda Reasons 20 MG TABS tablet TAKE 1 TABLET (20 MG TOTAL) BY MOUTH DAILY WITH SUPPER.  . [DISCONTINUED] atorvastatin (LIPITOR) 10 MG tablet Take 0.5 tablets (5 mg total) by mouth daily.     Allergies:   Albuterol   Social History   Tobacco Use  . Smoking status: Former Smoker    Quit date: 06/07/2012    Years  since quitting: 6.5  . Smokeless tobacco: Never Used  Substance Use Topics  . Alcohol use: No  . Drug use: No     Family Hx: The patient's family history includes Asthma in her father; Breast cancer in her sister; Diabetes in her brother and mother; Heart attack in her mother; Heart disease in her mother; Hypertension in her mother; Prostate cancer in her brother; Stroke in her brother and father.  ROS:   Please see the history of present illness.    As mentioned above  All other systems reviewed and are negative.   Prior CV studies:   The following studies were reviewed today:  Previous EKGs were reviewed  Labs/Other Tests and Data Reviewed:    EKG:  Prior EKGs were reviewed  Recent Labs: 01/31/2018: Hemoglobin 8.9; Platelets 285 12/05/2018: ALT 21; BUN 24; Creatinine, Ser 0.99; Potassium 5.0; Sodium 142; TSH 2.580   Recent Lipid Panel Lab Results  Component Value Date/Time   CHOL 215 (H) 12/05/2018 11:46 AM   TRIG 166 (H) 12/05/2018 11:46 AM   HDL 52 12/05/2018 11:46 AM   CHOLHDL 4.1 12/05/2018 11:46 AM   LDLCALC 130 (H) 12/05/2018 11:46 AM    Wt Readings from Last 3 Encounters:  01/08/19 135 lb (61.2 kg)  12/05/18 144 lb (65.3 kg)  09/03/18 140 lb (63.5 kg)     Objective:    Vital Signs:  BP (!) 149/62 (BP Location: Left Arm, Patient Position: Sitting, Cuff Size: Normal)   Pulse 86   Ht 5\' 6"  (1.676 m)   Wt 135 lb (61.2 kg)   BMI 21.79 kg/m    VITAL SIGNS:  reviewed  ASSESSMENT & PLAN:    1. Persistent atrial fibrillation:I discussed with the patient atrial fibrillation, disease process. Management and therapy including rate and rhythm control, anticoagulation benefits and potential risks were discussed extensively with the patient. Patient had multiple questions which were answered to patient's satisfaction. 2. Mixed dyslipidemia: She is not keen on any lipid-lowering medication therapy and she is very firm about it.  I respect her wishes and diet was  discussed 3. Patient will be seen in follow-up appointment in 6 months or earlier if the patient has any concerns   COVID-19 Education: The signs and symptoms of COVID-19 were discussed with the patient and how to seek care for testing (follow up with PCP or arrange E-visit).  The importance of social distancing was discussed today.  Time:   Today, I have spent 12 minutes with the patient with telehealth technology discussing the above problems.     Medication Adjustments/Labs and Tests Ordered: Current medicines are reviewed at length with the patient today.  Concerns regarding medicines are outlined above.   Tests Ordered: No orders of the defined types were placed in this encounter.   Medication  Changes: No orders of the defined types were placed in this encounter.   Follow Up:  Virtual Visit or In Person in 2 month(s)  Signed, Jenean Lindau, MD  01/08/2019 4:28 PM    [place]

## 2019-01-08 NOTE — Telephone Encounter (Signed)
Called patient to review dc instructions post televisit with Dr. Geraldo Pitter. Patient was under the impression that Dr. Geraldo Pitter was going to stop her digoxin since she's struggling with side effects of fatigue and lack of appetite. Informed patient that no order given to stop medication but I will inquire with Dr. Geraldo Pitter and follow-up with her.

## 2019-01-08 NOTE — Patient Instructions (Signed)
Medication Instructions: Your physician recommends that you continue on your current medications as directed. Please refer to the Current Medication list given to you today.  If you need a refill on your cardiac medications before your next appointment, please call your pharmacy.   Lab work: NONE If you have labs (blood work) drawn today and your tests are completely normal, you will receive your results only by: . MyChart Message (if you have MyChart) OR . A paper copy in the mail If you have any lab test that is abnormal or we need to change your treatment, we will call you to review the results.  Testing/Procedures: NONE  Follow-Up: At [facility], you and your health needs are our priority.  As part of our continuing mission to provide you with exceptional heart care, we have created designated Provider Care Teams.  These Care Teams include your primary Cardiologist (physician) and Advanced Practice Providers (APPs -  Physician Assistants and Nurse Practitioners) who all work together to provide you with the care you need, when you need it. You will need a follow up appointment in 2 months.    

## 2019-01-09 NOTE — Telephone Encounter (Signed)
Please refer to my note....... it mentions that she needs to stop her digoxin.

## 2019-01-10 NOTE — Addendum Note (Signed)
Addended by: Polly Cobia A on: 01/10/2019 02:04 PM   Modules accepted: Orders

## 2019-01-10 NOTE — Telephone Encounter (Signed)
Clarified with Dr. Geraldo Pitter that digoxin to be dc'd. Phoned and informed patient to stop digoxin. Instructed to call office with any questions or concerns and confirmed f/u appt. 03/10/19. She verbalizes understanding.

## 2019-01-15 DIAGNOSIS — R739 Hyperglycemia, unspecified: Secondary | ICD-10-CM | POA: Diagnosis not present

## 2019-01-15 DIAGNOSIS — R110 Nausea: Secondary | ICD-10-CM | POA: Diagnosis not present

## 2019-01-15 DIAGNOSIS — E559 Vitamin D deficiency, unspecified: Secondary | ICD-10-CM | POA: Diagnosis not present

## 2019-01-15 DIAGNOSIS — Z6823 Body mass index (BMI) 23.0-23.9, adult: Secondary | ICD-10-CM | POA: Diagnosis not present

## 2019-01-15 DIAGNOSIS — R5383 Other fatigue: Secondary | ICD-10-CM | POA: Diagnosis not present

## 2019-01-15 DIAGNOSIS — J309 Allergic rhinitis, unspecified: Secondary | ICD-10-CM | POA: Diagnosis not present

## 2019-02-04 DIAGNOSIS — K5909 Other constipation: Secondary | ICD-10-CM | POA: Diagnosis not present

## 2019-02-04 DIAGNOSIS — F418 Other specified anxiety disorders: Secondary | ICD-10-CM | POA: Diagnosis not present

## 2019-02-04 DIAGNOSIS — I10 Essential (primary) hypertension: Secondary | ICD-10-CM | POA: Diagnosis not present

## 2019-02-04 DIAGNOSIS — E785 Hyperlipidemia, unspecified: Secondary | ICD-10-CM | POA: Diagnosis not present

## 2019-02-13 ENCOUNTER — Other Ambulatory Visit: Payer: Self-pay

## 2019-02-13 ENCOUNTER — Ambulatory Visit (INDEPENDENT_AMBULATORY_CARE_PROVIDER_SITE_OTHER): Payer: Medicare Other

## 2019-02-13 DIAGNOSIS — I4819 Other persistent atrial fibrillation: Secondary | ICD-10-CM

## 2019-02-13 DIAGNOSIS — I714 Abdominal aortic aneurysm, without rupture: Secondary | ICD-10-CM | POA: Diagnosis not present

## 2019-02-13 NOTE — Progress Notes (Addendum)
Abdominal aortic exam has been performed. An aneurysm was seen and evaluated. The largest diameter of 5.86 cm was measured in the distal segment.  Jimmy Kerby Hockley RDCS, RVT

## 2019-02-14 ENCOUNTER — Telehealth: Payer: Self-pay

## 2019-02-14 DIAGNOSIS — I714 Abdominal aortic aneurysm, without rupture, unspecified: Secondary | ICD-10-CM

## 2019-02-14 NOTE — Telephone Encounter (Signed)
-----   Message from Jenean Lindau, MD sent at 02/14/2019  1:02 PM EDT ----- Needs appointment with vascular surgeon as soon as possible.  Informed patient and send copy to primary care Jenean Lindau, MD 02/14/2019 1:02 PM

## 2019-02-14 NOTE — Telephone Encounter (Signed)
Patient informed of results and not very receptive to being referred to vascular. RN expressed urgency for patient to go to consult. Patient requested that RN also relay information to her sister Naoma Diener. RN called [name]'s home and cell # with no answer. Urgent vascular referral sent. Copy sent to Dr. Rica Records

## 2019-02-19 ENCOUNTER — Other Ambulatory Visit: Payer: Self-pay

## 2019-02-19 ENCOUNTER — Ambulatory Visit (INDEPENDENT_AMBULATORY_CARE_PROVIDER_SITE_OTHER): Payer: Medicare Other | Admitting: Vascular Surgery

## 2019-02-19 ENCOUNTER — Encounter: Payer: Self-pay | Admitting: Vascular Surgery

## 2019-02-19 VITALS — BP 122/72 | HR 90 | Temp 97.3°F | Resp 20 | Ht 66.0 in | Wt 141.0 lb

## 2019-02-19 DIAGNOSIS — I714 Abdominal aortic aneurysm, without rupture, unspecified: Secondary | ICD-10-CM

## 2019-02-19 NOTE — Progress Notes (Signed)
REASON FOR CONSULT:    Abdominal aortic aneurysm.  The consult is requested by Dr. Geraldo Pitter.  ASSESSMENT & PLAN:   ABDOMINAL AORTIC ANEURYSM: This patient has a 5.9 cm infrarenal abdominal aortic aneurysm.  I explained that in a normal risk patient we would consider elective repair at 5.5 cm.  Given her age I do not think she is normal risk.  I have recommended a CT angiogram to further evaluate her aneurysm.  If this shows that she is a candidate for endovascular approach that I think we should consider elective repair if she were agreeable.  She would need preoperative cardiac evaluation.  If she would only be a candidate for open repair and I would favor a follow-up study in 6 months and continue to follow this.  We will only consider open repair, given the increased risk, if the aneurysm enlarges significantly.  PERIPHERAL VASCULAR DISEASE: I am unable to palpate pedal pulses.  I have ordered follow-up ABIs when she returns after her CT scan.  Fortunately she quit smoking in 2014.  Of note she is is on Xarelto for atrial fibrillation.   [name], MD, FACS Beeper [phone] Office: [phone]    HPI:   Adriana Castro is a pleasant 85 y.o. female, who was referred with a 5.9 cm infrarenal abdominal aortic aneurysm.  She tells me that this was discovered 5 to 6 years ago and was followed in [place].  I do not have any of those records.  She had an ultrasound on 02/13/2019 which showed a 5.9 cm infrarenal abdominal aortic aneurysm.  She denies any abdominal pain or back pain.  She does have a history of ovarian cancer and underwent abdominal surgery for this.  She says that she is done well from that standpoint.  She denies any significant cardiac history.  She denies any history of myocardial infarction, congestive heart failure or recent chest pain.  She does admit to some dyspnea on exertion.  Risk factors for peripheral vascular disease include hypertension,  hyperlipidemia, and history of tobacco use.  She quit in 2014.  She denies any history of diabetes.  She denies any family history of aneurysmal disease or premature cardiovascular disease.  Past Medical History:  Diagnosis Date  . Anxiety   . Anxiety   . Arthritis   . COPD (chronic obstructive pulmonary disease) ([place])   . Dyslipidemia   . Dysrhythmia    ATRIAL FIBRILATION  . GERD (gastroesophageal reflux disease)   . Heart murmur   . Hypertension   . Irregular heart beat   . Leaky heart valve   . Mood disorder ([place])   . Ovarian cancer ([place])   . Shortness of breath     Family History  Problem Relation Age of Onset  . Diabetes Mother   . Heart attack Mother   . Heart disease Mother   . Hypertension Mother   . Asthma Father   . Stroke Father   . Breast cancer Sister   . Prostate cancer Brother   . Diabetes Brother   . Stroke Brother     SOCIAL HISTORY: Social History   Socioeconomic History  . Marital status: Widowed    Spouse name: Not on file  . Number of children: Not on file  . Years of education: Not on file  . Highest education level: Not on file  Occupational History  . Not on file  Social Needs  . Financial resource strain: Not on file  . Food insecurity  Worry: Not on file    Inability: Not on file  . Transportation needs    Medical: Not on file    Non-medical: Not on file  Tobacco Use  . Smoking status: Former Smoker    Quit date: 06/07/2012    Years since quitting: 6.7  . Smokeless tobacco: Never Used  Substance and Sexual Activity  . Alcohol use: No  . Drug use: No  . Sexual activity: Not on file  Lifestyle  . Physical activity    Days per week: Not on file    Minutes per session: Not on file  . Stress: Not on file  Relationships  . Social Herbalist on phone: Not on file    Gets together: Not on file    Attends religious service: Not on file    Active member of club or organization: Not on file    Attends meetings of clubs  or organizations: Not on file    Relationship status: Not on file  . Intimate partner violence    Fear of current or ex partner: Not on file    Emotionally abused: Not on file    Physically abused: Not on file    Forced sexual activity: Not on file  Other Topics Concern  . Not on file  Social History Narrative  . Not on file    Allergies  Allergen Reactions  . Albuterol Nausea And Vomiting    Current Outpatient Medications  Medication Sig Dispense Refill  . digoxin (LANOXIN) 0.25 MG tablet Take 0.25 mg by mouth daily. M-W-F only    . diltiazem (CARDIZEM) 120 MG tablet Take 1 tablet (120 mg total) by mouth daily. 90 tablet 2  . fluticasone (FLONASE) 50 MCG/ACT nasal spray Place 1 spray into both nostrils daily as needed for allergies.    Marland Kitchen ipratropium (ATROVENT) 0.02 % nebulizer solution Take 500 mcg by nebulization 2 (two) times daily.     . montelukast (SINGULAIR) 10 MG tablet Take 10 mg by mouth every evening.  2  . Multiple Vitamin (MULTI-VITAMINS) TABS Take 1 tablet by mouth daily.    . nitroGLYCERIN (NITROSTAT) 0.4 MG SL tablet Place 1 tablet (0.4 mg total) under the tongue every 5 (five) minutes as needed. 25 tablet 11  . Vitamin D, Ergocalciferol, (DRISDOL) 50000 units CAPS capsule Take 50,000 Units by mouth as needed. supplement  3  . vitamin E 400 UNIT capsule Take 800 Units by mouth daily.    Alveda Reasons 20 MG TABS tablet TAKE 1 TABLET (20 MG TOTAL) BY MOUTH DAILY WITH SUPPER. 90 tablet 1  . dicyclomine (BENTYL) 20 MG tablet TAKE 1/2 TABLET TWO TIMES DAILY, AS NEEDED FOR ABDOMINAL PAIN    . omeprazole (PRILOSEC) 20 MG capsule Take 20 mg by mouth every morning.  1   No current facility-administered medications for this visit.     REVIEW OF SYSTEMS:  [X]  denotes positive finding, [ ]  denotes negative finding Cardiac  Comments:  Chest pain or chest pressure:    Shortness of breath upon exertion: x   Short of breath when lying flat:    Irregular heart rhythm: x        Vascular    Pain in calf, thigh, or hip brought on by ambulation:    Pain in feet at night that wakes you up from your sleep:     Blood clot in your veins:    Leg swelling:  Pulmonary    Oxygen at home:    Productive cough:     Wheezing:  x       Neurologic    Sudden weakness in arms or legs:     Sudden numbness in arms or legs:     Sudden onset of difficulty speaking or slurred speech:    Temporary loss of vision in one eye:     Problems with dizziness:         Gastrointestinal    Blood in stool:     Vomited blood:         Genitourinary    Burning when urinating:     Blood in urine:        Psychiatric    Major depression:         Hematologic    Bleeding problems:    Problems with blood clotting too easily:        Skin    Rashes or ulcers:        Constitutional    Fever or chills:     PHYSICAL EXAM:   Vitals:   02/19/19 0927  BP: 122/72  Pulse: 90  Resp: 20  Temp: (!) 97.3 F (36.3 C)  SpO2: 95%  Weight: 141 lb (64 kg)  Height: 5\' 6"  (1.676 m)    GENERAL: The patient is a well-nourished female, in no acute distress. The vital signs are documented above. CARDIAC: There is a regular rate and rhythm.  VASCULAR: I do not detect carotid bruits. She has palpable femoral pulses. I cannot palpate popliteal or pedal pulses. PULMONARY: There is good air exchange bilaterally without wheezing or rales. ABDOMEN: Soft and non-tender with normal pitched bowel sounds.  Her aneurysm is palpable and nontender. MUSCULOSKELETAL: There are no major deformities or cyanosis. NEUROLOGIC: No focal weakness or paresthesias are detected. SKIN: There are no ulcers or rashes noted. PSYCHIATRIC: The patient has a normal affect.  DATA:    ULTRASOUND: I reviewed the ultrasound that was done on 02/13/2019.  This showed that the maximum diameter of the infrarenal aorta was 5.9 cm.  The iliac arteries were not especially dilated.  I cannot find any previous CT scans of the  abdomen or pelvis.  LABS: Creatinine is 0.99.  Hemoglobin is 8.9.  Platelets are 285.  GFR is 51.

## 2019-02-20 ENCOUNTER — Other Ambulatory Visit: Payer: Self-pay | Admitting: Vascular Surgery

## 2019-02-20 DIAGNOSIS — I714 Abdominal aortic aneurysm, without rupture, unspecified: Secondary | ICD-10-CM

## 2019-03-05 DIAGNOSIS — Z23 Encounter for immunization: Secondary | ICD-10-CM | POA: Diagnosis not present

## 2019-03-10 ENCOUNTER — Encounter: Payer: Self-pay | Admitting: Cardiology

## 2019-03-10 ENCOUNTER — Ambulatory Visit (INDEPENDENT_AMBULATORY_CARE_PROVIDER_SITE_OTHER): Payer: Medicare Other | Admitting: Cardiology

## 2019-03-10 ENCOUNTER — Other Ambulatory Visit: Payer: Self-pay

## 2019-03-10 VITALS — BP 118/72 | HR 78 | Wt 141.0 lb

## 2019-03-10 DIAGNOSIS — I714 Abdominal aortic aneurysm, without rupture, unspecified: Secondary | ICD-10-CM

## 2019-03-10 DIAGNOSIS — E782 Mixed hyperlipidemia: Secondary | ICD-10-CM

## 2019-03-10 DIAGNOSIS — I10 Essential (primary) hypertension: Secondary | ICD-10-CM | POA: Diagnosis not present

## 2019-03-10 DIAGNOSIS — I4819 Other persistent atrial fibrillation: Secondary | ICD-10-CM

## 2019-03-10 NOTE — Progress Notes (Signed)
Cardiology Office Note:    Date:  03/10/2019   ID:  [name], DOB 09-25-1928, MRN [id number]  PCP:  Nicholos Johns, MD  Cardiologist:  Jenean Lindau, MD   Referring MD: Nicholos Johns, MD    ASSESSMENT:    1. Persistent atrial fibrillation ([place])   2. Essential hypertension   3. Abdominal aortic aneurysm without rupture ([place])   4. Mixed hyperlipidemia    PLAN:    In order of problems listed above:  1. Atrial fibrillation:I discussed with the patient atrial fibrillation, disease process. Management and therapy including rate and rhythm control, anticoagulation benefits and potential risks were discussed extensively with the patient. Patient had multiple questions which were answered to patient's satisfaction. 2. I reviewed the records and notes pertaining to the abdominal aortic ultrasound and discussed with her at extensive length.  They are mentioned below.  I told her that I am very agreeable with her surgeon about getting stenting for the aneurysm as it is of significant size and she plans to proceed.  She has been made aware of the benefits and risks specialist. 3. Patient will be seen in follow-up appointment in 3 months or earlier if the patient has any concerns    Medication Adjustments/Labs and Tests Ordered: Current medicines are reviewed at length with the patient today.  Concerns regarding medicines are outlined above.  No orders of the defined types were placed in this encounter.  No orders of the defined types were placed in this encounter.    No chief complaint on file.    History of Present Illness:    Millisa Gilkeson is a 85 y.o. female.  Patient has past medical history of atrial fibrillation.  She recently was diagnosed with 5.9 cm abdominal aortic ultrasound based on ultrasonography.  A CT scan has been planned.  Her vascular doctor has mentioned to her about stenting which I feel is very appropriate.  She denies any problems at this time.  No chest pain  orthopnea or PND.  No abdominal pain.  Past Medical History:  Diagnosis Date   Anxiety    Anxiety    Arthritis    COPD (chronic obstructive pulmonary disease) (HCC)    Dyslipidemia    Dysrhythmia    ATRIAL FIBRILATION   GERD (gastroesophageal reflux disease)    Heart murmur    Hypertension    Irregular heart beat    Leaky heart valve    Mood disorder (HCC)    Ovarian cancer (HCC)    Shortness of breath     Past Surgical History:  Procedure Laterality Date   BREAST BIOPSY     CATARACT EXTRACTION, BILATERAL     ORIF HUMERUS FRACTURE Left 11/01/2016   Procedure: OPEN REDUCTION INTERNAL FIXATION (ORIF) LEFT SUPRACONDYLAR HUMERUS AND MEDIAL EPICONDYLE;  Surgeon: Leandrew Koyanagi, MD;  Location: [facility];  Service: Orthopedics;  Laterality: Left;   SHOULDER SURGERY Right    ?   2002      SKIN CANCER EXCISION     X 5   TONSILLECTOMY      Current Medications: Current Meds  Medication Sig   atorvastatin (LIPITOR) 10 MG tablet    digoxin (LANOXIN) 0.25 MG tablet Take 0.25 mg by mouth daily. M-W-F only   diltiazem (CARDIZEM) 120 MG tablet Take 1 tablet (120 mg total) by mouth daily.   fluticasone (FLONASE) 50 MCG/ACT nasal spray Place 1 spray into both nostrils daily as needed for allergies.   ipratropium (ATROVENT) 0.02 %  nebulizer solution Take 500 mcg by nebulization 2 (two) times daily.    montelukast (SINGULAIR) 10 MG tablet Take 10 mg by mouth every evening.   Multiple Vitamin (MULTI-VITAMINS) TABS Take 1 tablet by mouth daily.   nitroGLYCERIN (NITROSTAT) 0.4 MG SL tablet Place 1 tablet (0.4 mg total) under the tongue every 5 (five) minutes as needed.   omeprazole (PRILOSEC) 20 MG capsule Take 20 mg by mouth every morning.   Vitamin D, Ergocalciferol, (DRISDOL) 50000 units CAPS capsule Take 50,000 Units by mouth as needed. supplement   vitamin E 400 UNIT capsule Take 800 Units by mouth daily.   XARELTO 20 MG TABS tablet TAKE 1 TABLET (20 MG TOTAL)  BY MOUTH DAILY WITH SUPPER.     Allergies:   Albuterol   Social History   Socioeconomic History   Marital status: Widowed    Spouse name: Not on file   Number of children: Not on file   Years of education: Not on file   Highest education level: Not on file  Occupational History   Not on file  Social Needs   Financial resource strain: Not on file   Food insecurity    Worry: Not on file    Inability: Not on file   Transportation needs    Medical: Not on file    Non-medical: Not on file  Tobacco Use   Smoking status: Former Smoker    Quit date: 06/07/2012    Years since quitting: 6.7   Smokeless tobacco: Never Used  Substance and Sexual Activity   Alcohol use: No   Drug use: No   Sexual activity: Not on file  Lifestyle   Physical activity    Days per week: Not on file    Minutes per session: Not on file   Stress: Not on file  Relationships   Social connections    Talks on phone: Not on file    Gets together: Not on file    Attends religious service: Not on file    Active member of club or organization: Not on file    Attends meetings of clubs or organizations: Not on file    Relationship status: Not on file  Other Topics Concern   Not on file  Social History Narrative   Not on file     Family History: The patient's family history includes Asthma in her father; Breast cancer in her sister; Diabetes in her brother and mother; Heart attack in her mother; Heart disease in her mother; Hypertension in her mother; Prostate cancer in her brother; Stroke in her brother and father.  ROS:   Please see the history of present illness.    All other systems reviewed and are negative.  EKGs/Labs/Other Studies Reviewed:    The following studies were reviewed today: Summary: Abdominal Aorta: There is evidence of abnormal dilatation of the distal Abdominal aorta. The largest aortic measurement is 5.9 cm. No previous exam available for comparison.  Previous  CTA diameter 4.6 cm in 03/01/2016   *See table(s) above for measurements and observations. Vascular consult recommended.   Electronically signed by Shirlee More MD on 02/13/2019 at 7:43:06 PM.   Recent Labs: 12/05/2018: ALT 21; BUN 24; Creatinine, Ser 0.99; Potassium 5.0; Sodium 142; TSH 2.580  Recent Lipid Panel    Component Value Date/Time   CHOL 215 (H) 12/05/2018 1146   TRIG 166 (H) 12/05/2018 1146   HDL 52 12/05/2018 1146   CHOLHDL 4.1 12/05/2018 1146   LDLCALC 130 (H)  12/05/2018 1146    Physical Exam:    VS:  BP 118/72 (BP Location: Left Arm, Patient Position: Sitting, Cuff Size: Normal)    Pulse 78    Wt 141 lb (64 kg)    SpO2 97%    BMI 22.76 kg/m     Wt Readings from Last 3 Encounters:  03/10/19 141 lb (64 kg)  02/19/19 141 lb (64 kg)  01/08/19 135 lb (61.2 kg)     GEN: Patient is in no acute distress HEENT: Normal NECK: No JVD; No carotid bruits LYMPHATICS: No lymphadenopathy CARDIAC: Hear sounds regular, 2/6 systolic murmur at the apex. RESPIRATORY:  Clear to auscultation without rales, wheezing or rhonchi  ABDOMEN: Soft, non-tender, non-distended MUSCULOSKELETAL:  No edema; No deformity  SKIN: Warm and dry NEUROLOGIC:  Alert and oriented x 3 PSYCHIATRIC:  Normal affect   Signed, Jenean Lindau, MD  03/10/2019 11:53 AM    [place]

## 2019-03-10 NOTE — Patient Instructions (Signed)

## 2019-03-19 ENCOUNTER — Inpatient Hospital Stay: Admission: RE | Admit: 2019-03-19 | Payer: Medicare Other | Source: Ambulatory Visit

## 2019-03-19 ENCOUNTER — Other Ambulatory Visit: Payer: Self-pay

## 2019-03-19 ENCOUNTER — Ambulatory Visit
Admission: RE | Admit: 2019-03-19 | Discharge: 2019-03-19 | Disposition: A | Discharge status: Home / Self Care | Payer: Medicare Other | Source: Ambulatory Visit | Attending: Vascular Surgery | Admitting: Vascular Surgery

## 2019-03-19 DIAGNOSIS — I714 Abdominal aortic aneurysm, without rupture, unspecified: Secondary | ICD-10-CM

## 2019-03-19 DIAGNOSIS — I701 Atherosclerosis of renal artery: Secondary | ICD-10-CM | POA: Diagnosis not present

## 2019-03-19 DIAGNOSIS — I700 Atherosclerosis of aorta: Secondary | ICD-10-CM | POA: Diagnosis not present

## 2019-03-19 MED ORDER — IOPAMIDOL (ISOVUE-370) INJECTION 76%
75.0000 mL | Freq: Once | INTRAVENOUS | Status: AC | PRN
  Administered 2019-03-19: 75 mL via INTRAVENOUS

## 2019-03-26 ENCOUNTER — Encounter: Payer: Self-pay | Admitting: Vascular Surgery

## 2019-03-26 ENCOUNTER — Other Ambulatory Visit: Payer: Self-pay

## 2019-03-26 ENCOUNTER — Ambulatory Visit (INDEPENDENT_AMBULATORY_CARE_PROVIDER_SITE_OTHER): Payer: Medicare Other | Admitting: Vascular Surgery

## 2019-03-26 VITALS — BP 120/74 | HR 70 | Temp 97.4°F | Resp 20 | Ht 66.0 in | Wt 140.0 lb

## 2019-03-26 DIAGNOSIS — I714 Abdominal aortic aneurysm, without rupture, unspecified: Secondary | ICD-10-CM

## 2019-03-26 NOTE — Progress Notes (Signed)
Patient name: Adriana Castro MRN: [id number] DOB: [birth year] Sex: female  REASON FOR VISIT:   Follow-up of abdominal aortic aneurysm.  HPI:   Adriana Castro is a pleasant 85 y.o. female who I saw on 02/19/2019 with an abdominal aortic aneurysm.  This was 5.9 cm in maximum diameter.  I explained that under normal risk patient we would consider elective repair at 5.5 cm.  However, given her age I did not think she was normal risk.  I recommended a CT angiogram to determine if she was an endovascular candidate.  If she was I thought this would be worth considering.  However if she was not a candidate for endovascular approach I felt she would be at high risk for open surgery and therefore would likely follow this up in 6 months.  She was also supposed to have ABIs when she returns but this has not been done.  Since I saw her last she denies any abdominal pain or back pain.  There have been no changes to her medical history.  Past Medical History:  Diagnosis Date  . Anxiety   . Anxiety   . Arthritis   . COPD (chronic obstructive pulmonary disease) ([place])   . Dyslipidemia   . Dysrhythmia    ATRIAL FIBRILATION  . GERD (gastroesophageal reflux disease)   . Heart murmur   . Hypertension   . Irregular heart beat   . Leaky heart valve   . Mood disorder ([place])   . Ovarian cancer ([place])   . Shortness of breath     Family History  Problem Relation Age of Onset  . Diabetes Mother   . Heart attack Mother   . Heart disease Mother   . Hypertension Mother   . Asthma Father   . Stroke Father   . Breast cancer Sister   . Prostate cancer Brother   . Diabetes Brother   . Stroke Brother     SOCIAL HISTORY: Social History   Tobacco Use  . Smoking status: Former Smoker    Quit date: 06/07/2012    Years since quitting: 6.8  . Smokeless tobacco: Never Used  Substance Use Topics  . Alcohol use: No    Allergies  Allergen Reactions  . Albuterol Nausea And Vomiting    Current Outpatient  Medications  Medication Sig Dispense Refill  . atorvastatin (LIPITOR) 10 MG tablet     . dicyclomine (BENTYL) 20 MG tablet TAKE 1/2 TABLET TWO TIMES DAILY, AS NEEDED FOR ABDOMINAL PAIN    . digoxin (LANOXIN) 0.25 MG tablet Take 0.25 mg by mouth daily. M-W-F only    . diltiazem (CARDIZEM) 120 MG tablet Take 1 tablet (120 mg total) by mouth daily. 90 tablet 2  . fluticasone (FLONASE) 50 MCG/ACT nasal spray Place 1 spray into both nostrils daily as needed for allergies.    Marland Kitchen ipratropium (ATROVENT) 0.02 % nebulizer solution Take 500 mcg by nebulization 2 (two) times daily.     . montelukast (SINGULAIR) 10 MG tablet Take 10 mg by mouth every evening.  2  . Multiple Vitamin (MULTI-VITAMINS) TABS Take 1 tablet by mouth daily.    . nitroGLYCERIN (NITROSTAT) 0.4 MG SL tablet Place 1 tablet (0.4 mg total) under the tongue every 5 (five) minutes as needed. 25 tablet 11  . omeprazole (PRILOSEC) 20 MG capsule Take 20 mg by mouth every morning.  1  . Vitamin D, Ergocalciferol, (DRISDOL) 50000 units CAPS capsule Take 50,000 Units by mouth as needed. supplement  3  .  vitamin E 400 UNIT capsule Take 800 Units by mouth daily.    Alveda Reasons 20 MG TABS tablet TAKE 1 TABLET (20 MG TOTAL) BY MOUTH DAILY WITH SUPPER. 90 tablet 1   No current facility-administered medications for this visit.     REVIEW OF SYSTEMS:  [X]  denotes positive finding, [ ]  denotes negative finding Cardiac  Comments:  Chest pain or chest pressure: x   Shortness of breath upon exertion: x   Short of breath when lying flat: x   Irregular heart rhythm:        Vascular    Pain in calf, thigh, or hip brought on by ambulation: x   Pain in feet at night that wakes you up from your sleep:  x   Blood clot in your veins:    Leg swelling:  x       Pulmonary    Oxygen at home:    Productive cough:  x   Wheezing:  x       Neurologic    Sudden weakness in arms or legs:     Sudden numbness in arms or legs:     Sudden onset of difficulty  speaking or slurred speech:    Temporary loss of vision in one eye:     Problems with dizziness:  x       Gastrointestinal    Blood in stool:     Vomited blood:         Genitourinary    Burning when urinating:     Blood in urine:        Psychiatric    Major depression:         Hematologic    Bleeding problems:    Problems with blood clotting too easily:        Skin    Rashes or ulcers:        Constitutional    Fever or chills:     PHYSICAL EXAM:   Vitals:   03/26/19 0959  BP: 120/74  Pulse: 70  Resp: 20  Temp: (!) 97.4 F (36.3 C)  SpO2: 94%  Weight: 140 lb (63.5 kg)  Height: 5\' 6"  (1.676 m)    GENERAL: The patient is a well-nourished female, in no acute distress. The vital signs are documented above. CARDIAC: There is a regular rate and rhythm.  VASCULAR: I do not detect carotid bruits. She has palpable femoral pulses. PULMONARY: There is good air exchange bilaterally without wheezing or rales. ABDOMEN: Soft and non-tender with normal pitched bowel sounds.  Her aneurysm is nontender. MUSCULOSKELETAL: There are no major deformities or cyanosis. NEUROLOGIC: No focal weakness or paresthesias are detected. SKIN: There are no ulcers or rashes noted. PSYCHIATRIC: The patient has a normal affect.  DATA:    CT ANGIOGRAM: I have reviewed the images of her CT angiogram.  The maximum diameter of her aneurysm was 5.8 cm.  She does not have an adequate neck for an endovascular aneurysm repair.    MEDICAL ISSUES:   5.8 CM INFRARENAL ABDOMINAL AORTIC ANEURYSM: This patient has a 5.8 cm infrarenal abdominal aortic aneurysm.  She does not have an adequate neck for an endovascular aneurysm repair.  Given that she would require an open repair or a fenestrated graft I would not recommend considering addressing the aneurysm unless it enlarges significantly in size and she feels quite strongly in agreement with this.  We have discussed potential risk associated with a more  aggressive approach i.e. open repair  versus a fenestrated graft, and she would like to continue with conservative treatment.  She is agreeable to a follow-up duplex in 1 year and I will see her back at that time.  I have explained that if she does develop significant severe abdominal or back pain she would need to get to the emergency department where a CT scan could quickly sort out if this was related to her aneurysm.  I also explained that she needs to give serious consideration as to whether or not she wants to undergo attempted repair if it does rupture.  Fortunately she is not a smoker.  Her blood pressures under good control.  I will see her back in 1 year unless she call sooner.   [name] [facility] [phone]

## 2019-03-27 ENCOUNTER — Other Ambulatory Visit: Payer: Self-pay

## 2019-03-27 DIAGNOSIS — I714 Abdominal aortic aneurysm, without rupture, unspecified: Secondary | ICD-10-CM

## 2019-04-04 DIAGNOSIS — Z9181 History of falling: Secondary | ICD-10-CM | POA: Diagnosis not present

## 2019-04-04 DIAGNOSIS — Z0000 Encounter for general adult medical examination without abnormal findings: Secondary | ICD-10-CM | POA: Diagnosis not present

## 2019-04-04 DIAGNOSIS — Z1331 Encounter for screening for depression: Secondary | ICD-10-CM | POA: Diagnosis not present

## 2019-04-04 DIAGNOSIS — E785 Hyperlipidemia, unspecified: Secondary | ICD-10-CM | POA: Diagnosis not present

## 2019-05-05 DIAGNOSIS — I4891 Unspecified atrial fibrillation: Secondary | ICD-10-CM | POA: Diagnosis not present

## 2019-05-05 DIAGNOSIS — E785 Hyperlipidemia, unspecified: Secondary | ICD-10-CM | POA: Diagnosis not present

## 2019-05-05 DIAGNOSIS — E559 Vitamin D deficiency, unspecified: Secondary | ICD-10-CM | POA: Diagnosis not present

## 2019-05-05 DIAGNOSIS — I10 Essential (primary) hypertension: Secondary | ICD-10-CM | POA: Diagnosis not present

## 2019-05-12 ENCOUNTER — Other Ambulatory Visit: Payer: Self-pay | Admitting: Cardiology

## 2019-08-04 DIAGNOSIS — I4891 Unspecified atrial fibrillation: Secondary | ICD-10-CM | POA: Diagnosis not present

## 2019-08-04 DIAGNOSIS — E785 Hyperlipidemia, unspecified: Secondary | ICD-10-CM | POA: Diagnosis not present

## 2019-08-04 DIAGNOSIS — I10 Essential (primary) hypertension: Secondary | ICD-10-CM | POA: Diagnosis not present

## 2019-08-04 DIAGNOSIS — E559 Vitamin D deficiency, unspecified: Secondary | ICD-10-CM | POA: Diagnosis not present

## 2019-08-05 DIAGNOSIS — L821 Other seborrheic keratosis: Secondary | ICD-10-CM | POA: Diagnosis not present

## 2019-08-05 DIAGNOSIS — L853 Xerosis cutis: Secondary | ICD-10-CM | POA: Diagnosis not present

## 2019-08-05 DIAGNOSIS — L570 Actinic keratosis: Secondary | ICD-10-CM | POA: Diagnosis not present

## 2019-09-24 ENCOUNTER — Other Ambulatory Visit: Payer: Self-pay

## 2019-09-25 ENCOUNTER — Ambulatory Visit: Payer: Medicare Other | Admitting: Cardiology

## 2019-09-25 ENCOUNTER — Ambulatory Visit (INDEPENDENT_AMBULATORY_CARE_PROVIDER_SITE_OTHER): Payer: Medicare Other | Admitting: Cardiology

## 2019-09-25 ENCOUNTER — Other Ambulatory Visit: Payer: Self-pay

## 2019-09-25 ENCOUNTER — Encounter: Payer: Self-pay | Admitting: Cardiology

## 2019-09-25 VITALS — BP 128/78 | HR 90 | Temp 97.0°F | Ht 65.0 in | Wt 150.2 lb

## 2019-09-25 DIAGNOSIS — R5383 Other fatigue: Secondary | ICD-10-CM | POA: Diagnosis not present

## 2019-09-25 DIAGNOSIS — Z5181 Encounter for therapeutic drug level monitoring: Secondary | ICD-10-CM

## 2019-09-25 DIAGNOSIS — E782 Mixed hyperlipidemia: Secondary | ICD-10-CM

## 2019-09-25 DIAGNOSIS — I10 Essential (primary) hypertension: Secondary | ICD-10-CM | POA: Diagnosis not present

## 2019-09-25 DIAGNOSIS — I714 Abdominal aortic aneurysm, without rupture, unspecified: Secondary | ICD-10-CM

## 2019-09-25 DIAGNOSIS — I480 Paroxysmal atrial fibrillation: Secondary | ICD-10-CM

## 2019-09-25 DIAGNOSIS — E875 Hyperkalemia: Secondary | ICD-10-CM

## 2019-09-25 DIAGNOSIS — E559 Vitamin D deficiency, unspecified: Secondary | ICD-10-CM | POA: Diagnosis not present

## 2019-09-25 DIAGNOSIS — Z79899 Other long term (current) drug therapy: Secondary | ICD-10-CM | POA: Diagnosis not present

## 2019-09-25 NOTE — Patient Instructions (Signed)
Medication Instructions:  No medication changes *If you need a refill on your cardiac medications before your next appointment, please call your pharmacy*   Lab Work: Your physician recommends that you have labs done today in the office. We checked a BMET, CBC, TSH, LFT's, lipids, Vitamin D and Dig level.  If you have labs (blood work) drawn today and your tests are completely normal, you will receive your results only by: Marland Kitchen MyChart Message (if you have MyChart) OR . A paper copy in the mail If you have any lab test that is abnormal or we need to change your treatment, we will call you to review the results.   Testing/Procedures: None ordered   Follow-Up: At [facility], you and your health needs are our priority.  As part of our continuing mission to provide you with exceptional heart care, we have created designated Provider Care Teams.  These Care Teams include your primary Cardiologist (physician) and Advanced Practice Providers (APPs -  Physician Assistants and Nurse Practitioners) who all work together to provide you with the care you need, when you need it.  We recommend signing up for the patient portal called "MyChart".  Sign up information is provided on this After Visit Summary.  MyChart is used to connect with patients for Virtual Visits (Telemedicine).  Patients are able to view lab/test results, encounter notes, upcoming appointments, etc.  Non-urgent messages can be sent to your provider as well.   To learn more about what you can do with MyChart, go to [email or web address].    Your next appointment:   6 month(s)  The format for your next appointment:   In Person  Provider:   Jyl Heinz, MD   Other Instructions NAl

## 2019-09-25 NOTE — Progress Notes (Signed)
Cardiology Office Note:    Date:  09/25/2019   ID:  Adriana Castro, DOB April 12, 1929, MRN [id number]  PCP:  Nicholos Johns, MD  Cardiologist:  Jenean Lindau, MD   Referring MD: Nicholos Johns, MD    ASSESSMENT:    1. Abdominal aortic aneurysm without rupture (HCC)   2. Paroxysmal atrial fibrillation ([place])   3. Mixed hyperlipidemia    PLAN:    In order of problems listed above:  1. Secondary prevention stressed with the patient.  Importance of compliance with diet and medication stressed and she vocalized understanding. 2. Essential hypertension: Blood pressure is stable. 3. Mixed dyslipidemia: I discussed my findings with the patient at length.  She is fasting this morning and will have all blood work.  She is on statin therapy. 4. Abdominal aortic aneurysm: I reviewed records from vascular surgeon with her at extensive length she is followed very closely by vascular surgery and deemed to be very high risk for the procedure for repair.  Patient is aware of this.  Her daughter accompanied her for this visit also.  Details of that note mentioned below. 5. She has swelling in the right foot.  This is at the length level of her ankle and below.  There is no inflammation.  She tells me that it is not present when she wakes up in the morning and increases by the day and gets better again next morning.  She has no pain inflammation or any such issues and I asked her to discuss this with her primary care physician.  There is no abnormality above the level of the ankle.  This has been going on for the past 2 to 3 months. 6. Patient will be seen in follow-up appointment in 6 months or earlier if the patient has any concerns    Medication Adjustments/Labs and Tests Ordered: Current medicines are reviewed at length with the patient today.  Concerns regarding medicines are outlined above.  No orders of the defined types were placed in this encounter.  No orders of the defined types were placed in this  encounter.    No chief complaint on file.    History of Present Illness:    Adriana Castro is a 86 y.o. female.  Patient has past medical history of essential hypertension, atrial fibrillation, dyslipidemia and abdominal aortic aneurysm.  Her aneurysm is 5.9 cm and she is followed by vascular surgeon for this.  She denies any chest pain orthopnea or PND.  She takes care of activities of daily living.  Past Medical History:  Diagnosis Date  . Abdominal aortic aneurysm without rupture ([place]) 01/06/2016  . Adrenal adenoma 09/10/2013   Overview:  Left, unchanged per CT 09/10/2013  Formatting of this note might be different from the original. Left, unchanged per CT 09/10/2013  . Aneurysm ([place]) 09/10/2013   Formatting of this note might be different from the original. Infrarenal aortic aneurysm 4.7 cm in greatest anteroposterior dimensions per CT ( 09/10/2013 )  . Anxiety   . Anxiety   . Arthritis   . Atrial fibrillation ([place]) 09/10/2013   Formatting of this note might be different from the original. f/b Dr. Geraldo Pitter ( [place], [place] ) Xarelto 15mg  daily (decreased from 20mg  daily 4/25) Diltiazem 120 BID Amioderone 200mg  daily added 4/25  . Bilateral carotid artery stenosis 01/06/2016  . Chest pain 02/05/2013  . Closed bicondylar fracture of distal end of left humerus   . COPD (chronic obstructive pulmonary disease) ([place])   . Depression  09/10/2013  . Dyslipidemia   . Dysrhythmia    ATRIAL FIBRILATION  . Encounter for antineoplastic chemotherapy 11/09/2013  . GERD (gastroesophageal reflux disease)   . Heart murmur   . History of open reduction and internal fixation (ORIF) procedure 11/01/2016  . HTN (hypertension) 09/10/2013  . Hyperlipidemia 09/10/2013  . Hypertension   . Hypoxia 02/05/2013  . Irregular heart beat   . Leaky heart valve   . Malignant neoplasm of ovary ([place]) 09/28/2013   Overview:  S/p 4 cycles neoadjuvant chemotherapy 09/23/12 exploratory laparotomy, TAH/BSO, omentectomy  . Malignant ovarian  neoplasm ([place]) 09/28/2013   Formatting of this note might be different from the original. S/p 4 cycles neoadjuvant chemotherapy 09/23/12 exploratory laparotomy, TAH/BSO, omentectomy  . Mood disorder ([place])   . Ovarian cancer ([place])   . Shortness of breath     Past Surgical History:  Procedure Laterality Date  . BREAST BIOPSY    . CATARACT EXTRACTION, BILATERAL    . ORIF HUMERUS FRACTURE Left 11/01/2016   Procedure: OPEN REDUCTION INTERNAL FIXATION (ORIF) LEFT SUPRACONDYLAR HUMERUS AND MEDIAL EPICONDYLE;  Surgeon: Leandrew Koyanagi, MD;  Location: [facility];  Service: Orthopedics;  Laterality: Left;  . SHOULDER SURGERY Right    ?   2002     . SKIN CANCER EXCISION     X 5  . TONSILLECTOMY      Current Medications: Current Meds  Medication Sig  . ammonium lactate (LAC-HYDRIN) 12 % lotion Apply 1 application topically in the morning and at bedtime.  Marland Kitchen atorvastatin (LIPITOR) 10 MG tablet TAKE 1/2 TABLET BY MOUTH DAILY  . dicyclomine (BENTYL) 20 MG tablet TAKE 1/2 TABLET TWO TIMES DAILY, AS NEEDED FOR ABDOMINAL PAIN  . digoxin (LANOXIN) 0.125 MG tablet Take 125 mcg by mouth 3 (three) times a week.  . diltiazem (CARDIZEM) 120 MG tablet Take 1 tablet (120 mg total) by mouth daily.  . fluticasone (FLONASE) 50 MCG/ACT nasal spray Place 1 spray into both nostrils daily as needed for allergies.  Marland Kitchen ipratropium (ATROVENT) 0.02 % nebulizer solution Take 500 mcg by nebulization 2 (two) times daily.   . montelukast (SINGULAIR) 10 MG tablet Take 10 mg by mouth every evening.  . Multiple Vitamin (MULTI-VITAMINS) TABS Take 1 tablet by mouth daily.  . nitroGLYCERIN (NITROSTAT) 0.4 MG SL tablet Place 1 tablet (0.4 mg total) under the tongue every 5 (five) minutes as needed.  Marland Kitchen omeprazole (PRILOSEC) 20 MG capsule Take 20 mg by mouth every morning.  . Vitamin D, Ergocalciferol, (DRISDOL) 50000 units CAPS capsule Take 50,000 Units by mouth as needed. supplement  . vitamin E 400 UNIT capsule Take 800 Units by mouth  daily.  Alveda Reasons 20 MG TABS tablet TAKE 1 TABLET (20 MG TOTAL) BY MOUTH DAILY WITH SUPPER.     Allergies:   Albuterol   Social History   Socioeconomic History  . Marital status: Widowed    Spouse name: Not on file  . Number of children: Not on file  . Years of education: Not on file  . Highest education level: Not on file  Occupational History  . Not on file  Tobacco Use  . Smoking status: Former Smoker    Quit date: 06/07/2012    Years since quitting: 7.3  . Smokeless tobacco: Never Used  Substance and Sexual Activity  . Alcohol use: No  . Drug use: No  . Sexual activity: Not on file  Other Topics Concern  . Not on file  Social History Narrative  .  Not on file   Social Determinants of Health   Financial Resource Strain:   . Difficulty of Paying Living Expenses:   Food Insecurity:   . Worried About Charity fundraiser in the Last Year:   . Arboriculturist in the Last Year:   Transportation Needs:   . Film/video editor (Medical):   Marland Kitchen Lack of Transportation (Non-Medical):   Physical Activity:   . Days of Exercise per Week:   . Minutes of Exercise per Session:   Stress:   . Feeling of Stress :   Social Connections:   . Frequency of Communication with Friends and Family:   . Frequency of Social Gatherings with Friends and Family:   . Attends Religious Services:   . Active Member of Clubs or Organizations:   . Attends Archivist Meetings:   Marland Kitchen Marital Status:      Family History: The patient's family history includes Asthma in her father; Breast cancer in her sister; Diabetes in her brother and mother; Heart attack in her mother; Heart disease in her mother; Hypertension in her mother; Prostate cancer in her brother; Stroke in her brother and father.  ROS:   Please see the history of present illness.    All other systems reviewed and are negative.  EKGs/Labs/Other Studies Reviewed:    The following studies were reviewed today: I reviewed  vascular surgeons report at length.  Summary:  Abdominal Aorta: There is evidence of abnormal dilatation of the distal  Abdominal aorta. The largest aortic measurement is 5.9 cm. No previous  exam available for comparison.   Previous CTA diameter 4.6 cm in 03/01/2016    *See table(s) above for measurements and observations.  Vascular consult recommended.    Electronically signed by Shirlee More MD on 02/13/2019 at 7:43:06 PM.   REASON FOR VISIT:   Follow-up of abdominal aortic aneurysm.  HPI:   Adriana Castro is a pleasant 86 y.o. female who I saw on 02/19/2019 with an abdominal aortic aneurysm.  This was 5.9 cm in maximum diameter.  I explained that under normal risk patient we would consider elective repair at 5.5 cm.  However, given her age I did not think she was normal risk.  I recommended a CT angiogram to determine if she was an endovascular candidate.  If she was I thought this would be worth considering.  However if she was not a candidate for endovascular approach I felt she would be at high risk for open surgery and therefore would likely follow this up in 6 months.  She was also supposed to have ABIs when she returns but this has not been done.  Since I saw her last she denies any abdominal pain or back pain.  There have been no changes to her medical history.      Past Medical History:  Diagnosis Date  . Anxiety   . Anxiety   . Arthritis   . COPD (chronic obstructive pulmonary disease) ([place])   . Dyslipidemia   . Dysrhythmia    ATRIAL FIBRILATION  . GERD (gastroesophageal reflux disease)   . Heart murmur   . Hypertension   . Irregular heart beat   . Leaky heart valve   . Mood disorder ([place])   . Ovarian cancer ([place])   . Shortness of breath          Family History  Problem Relation Age of Onset  . Diabetes Mother   . Heart attack Mother   .  Heart disease Mother   . Hypertension Mother   . Asthma Father   . Stroke Father   .  Breast cancer Sister   . Prostate cancer Brother   . Diabetes Brother   . Stroke Brother     SOCIAL HISTORY: Social History   Tobacco Use  . Smoking status: Former Smoker    Quit date: 06/07/2012    Years since quitting: 6.8  . Smokeless tobacco: Never Used  Substance Use Topics  . Alcohol use: No        Allergies  Allergen Reactions  . Albuterol Nausea And Vomiting          Current Outpatient Medications  Medication Sig Dispense Refill  . atorvastatin (LIPITOR) 10 MG tablet     . dicyclomine (BENTYL) 20 MG tablet TAKE 1/2 TABLET TWO TIMES DAILY, AS NEEDED FOR ABDOMINAL PAIN    . digoxin (LANOXIN) 0.25 MG tablet Take 0.25 mg by mouth daily. M-W-F only    . diltiazem (CARDIZEM) 120 MG tablet Take 1 tablet (120 mg total) by mouth daily. 90 tablet 2  . fluticasone (FLONASE) 50 MCG/ACT nasal spray Place 1 spray into both nostrils daily as needed for allergies.    Marland Kitchen ipratropium (ATROVENT) 0.02 % nebulizer solution Take 500 mcg by nebulization 2 (two) times daily.     . montelukast (SINGULAIR) 10 MG tablet Take 10 mg by mouth every evening.  2  . Multiple Vitamin (MULTI-VITAMINS) TABS Take 1 tablet by mouth daily.    . nitroGLYCERIN (NITROSTAT) 0.4 MG SL tablet Place 1 tablet (0.4 mg total) under the tongue every 5 (five) minutes as needed. 25 tablet 11  . omeprazole (PRILOSEC) 20 MG capsule Take 20 mg by mouth every morning.  1  . Vitamin D, Ergocalciferol, (DRISDOL) 50000 units CAPS capsule Take 50,000 Units by mouth as needed. supplement  3  . vitamin E 400 UNIT capsule Take 800 Units by mouth daily.    Alveda Reasons 20 MG TABS tablet TAKE 1 TABLET (20 MG TOTAL) BY MOUTH DAILY WITH SUPPER. 90 tablet 1   No current facility-administered medications for this visit.     REVIEW OF SYSTEMS:  [X]  denotes positive finding, [ ]  denotes negative finding Cardiac  Comments:  Chest pain or chest pressure: x   Shortness of breath upon exertion: x     Short of breath when lying flat: x   Irregular heart rhythm:        Vascular    Pain in calf, thigh, or hip brought on by ambulation: x   Pain in feet at night that wakes you up from your sleep:  x   Blood clot in your veins:    Leg swelling:  x       Pulmonary    Oxygen at home:    Productive cough:  x   Wheezing:  x       Neurologic    Sudden weakness in arms or legs:     Sudden numbness in arms or legs:     Sudden onset of difficulty speaking or slurred speech:    Temporary loss of vision in one eye:     Problems with dizziness:  x       Gastrointestinal    Blood in stool:     Vomited blood:         Genitourinary    Burning when urinating:     Blood in urine:        Psychiatric  Major depression:         Hematologic    Bleeding problems:    Problems with blood clotting too easily:        Skin    Rashes or ulcers:        Constitutional    Fever or chills:     PHYSICAL EXAM:      Vitals:   03/26/19 0959  BP: 120/74  Pulse: 70  Resp: 20  Temp: (!) 97.4 F (36.3 C)  SpO2: 94%  Weight: 140 lb (63.5 kg)  Height: 5\' 6"  (1.676 m)    GENERAL: The patient is a well-nourished female, in no acute distress. The vital signs are documented above. CARDIAC: There is a regular rate and rhythm.  VASCULAR: I do not detect carotid bruits. She has palpable femoral pulses. PULMONARY: There is good air exchange bilaterally without wheezing or rales. ABDOMEN: Soft and non-tender with normal pitched bowel sounds.  Her aneurysm is nontender. MUSCULOSKELETAL: There are no major deformities or cyanosis. NEUROLOGIC: No focal weakness or paresthesias are detected. SKIN: There are no ulcers or rashes noted. PSYCHIATRIC: The patient has a normal affect.  DATA:    CT ANGIOGRAM: I have reviewed the images of her CT angiogram.  The maximum diameter of her aneurysm was 5.8 cm.  She does  not have an adequate neck for an endovascular aneurysm repair.    MEDICAL ISSUES:   5.8 CM INFRARENAL ABDOMINAL AORTIC ANEURYSM: This patient has a 5.8 cm infrarenal abdominal aortic aneurysm.  She does not have an adequate neck for an endovascular aneurysm repair.  Given that she would require an open repair or a fenestrated graft I would not recommend considering addressing the aneurysm unless it enlarges significantly in size and she feels quite strongly in agreement with this.  We have discussed potential risk associated with a more aggressive approach i.e. open repair versus a fenestrated graft, and she would like to continue with conservative treatment.  She is agreeable to a follow-up duplex in 1 year and I will see her back at that time.  I have explained that if she does develop significant severe abdominal or back pain she would need to get to the emergency department where a CT scan could quickly sort out if this was related to her aneurysm.  I also explained that she needs to give serious consideration as to whether or not she wants to undergo attempted repair if it does rupture.  Fortunately she is not a smoker.  Her blood pressures under good control.  I will see her back in 1 year unless she call sooner.   [name] [facility] [phone]    Recent Labs: 12/05/2018: ALT 21; BUN 24; Creatinine, Ser 0.99; Potassium 5.0; Sodium 142; TSH 2.580  Recent Lipid Panel    Component Value Date/Time   CHOL 215 (H) 12/05/2018 1146   TRIG 166 (H) 12/05/2018 1146   HDL 52 12/05/2018 1146   CHOLHDL 4.1 12/05/2018 1146   LDLCALC 130 (H) 12/05/2018 1146    Physical Exam:    VS:  BP 128/78   Pulse 90   Temp (!) 97 F (36.1 C)   Ht 5\' 5"  (1.651 m)   Wt 150 lb 3.2 oz (68.1 kg)   SpO2 95%   BMI 24.99 kg/m     Wt Readings from Last 3 Encounters:  09/25/19 150 lb 3.2 oz (68.1 kg)  03/26/19 140 lb (63.5 kg)  03/10/19 141 lb (  64 kg)      GEN: Patient is in no acute distress HEENT: Normal NECK: No JVD; No carotid bruits LYMPHATICS: No lymphadenopathy CARDIAC: Hear sounds regular, 2/6 systolic murmur at the apex. RESPIRATORY:  Clear to auscultation without rales, wheezing or rhonchi  ABDOMEN: Soft, non-tender, non-distended MUSCULOSKELETAL:  No edema; No deformity  SKIN: Warm and dry NEUROLOGIC:  Alert and oriented x 3 PSYCHIATRIC:  Normal affect   Signed, Jenean Lindau, MD  09/25/2019 11:01 AM    [place]

## 2019-09-26 ENCOUNTER — Telehealth: Payer: Self-pay

## 2019-09-26 LAB — LIPID PANEL
Chol/HDL Ratio: 3.6 ratio (ref 0.0–4.4)
Cholesterol, Total: 167 mg/dL (ref 100–199)
HDL: 47 mg/dL (ref >39)
LDL Chol Calc (NIH): 95 mg/dL (ref 0–99)
Triglycerides: 143 mg/dL (ref 0–149)
VLDL Cholesterol Cal: 25 mg/dL (ref 5–40)

## 2019-09-26 LAB — BASIC METABOLIC PANEL
BUN/Creatinine Ratio: 19 (ref 12–28)
BUN: 21 mg/dL (ref 8–27)
CO2: 26 mmol/L (ref 20–29)
Calcium: 10.1 mg/dL (ref 8.7–10.3)
Chloride: 101 mmol/L (ref 96–106)
Creatinine, Ser: 1.13 mg/dL — ABNORMAL HIGH (ref 0.57–1.00)
GFR calc Af Amer: 50 mL/min/{1.73_m2} — ABNORMAL LOW (ref >59)
GFR calc non Af Amer: 43 mL/min/{1.73_m2} — ABNORMAL LOW (ref >59)
Glucose: 105 mg/dL — ABNORMAL HIGH (ref 65–99)
Potassium: 5.9 mmol/L — ABNORMAL HIGH (ref 3.5–5.2)
Sodium: 142 mmol/L (ref 134–144)

## 2019-09-26 LAB — CBC WITH DIFFERENTIAL/PLATELET
Basophils Absolute: 0 10*3/uL (ref 0.0–0.2)
Basos: 0 %
EOS (ABSOLUTE): 0.1 10*3/uL (ref 0.0–0.4)
Eos: 1 %
Hematocrit: 41.4 % (ref 34.0–46.6)
Hemoglobin: 13.5 g/dL (ref 11.1–15.9)
Immature Grans (Abs): 0 10*3/uL (ref 0.0–0.1)
Immature Granulocytes: 0 %
Lymphocytes Absolute: 0.9 10*3/uL (ref 0.7–3.1)
Lymphs: 9 %
MCH: 30 pg (ref 26.6–33.0)
MCHC: 32.6 g/dL (ref 31.5–35.7)
MCV: 92 fL (ref 79–97)
Monocytes Absolute: 1.1 10*3/uL — ABNORMAL HIGH (ref 0.1–0.9)
Monocytes: 11 %
Neutrophils Absolute: 7.9 10*3/uL — ABNORMAL HIGH (ref 1.4–7.0)
Neutrophils: 79 %
Platelets: 272 10*3/uL (ref 150–450)
RBC: 4.5 x10E6/uL (ref 3.77–5.28)
RDW: 14.2 % (ref 11.7–15.4)
WBC: 10.1 10*3/uL (ref 3.4–10.8)

## 2019-09-26 LAB — DIGOXIN LEVEL: Digoxin, Serum: 0.6 ng/mL (ref 0.5–0.9)

## 2019-09-26 LAB — HEPATIC FUNCTION PANEL
ALT: 10 IU/L (ref 0–32)
AST: 19 IU/L (ref 0–40)
Albumin: 4.2 g/dL (ref 3.6–4.6)
Alkaline Phosphatase: 120 IU/L — ABNORMAL HIGH (ref 39–117)
Bilirubin Total: 0.4 mg/dL (ref 0.0–1.2)
Bilirubin, Direct: 0.16 mg/dL (ref 0.00–0.40)
Total Protein: 6.6 g/dL (ref 6.0–8.5)

## 2019-09-26 LAB — TSH: TSH: 2.46 u[IU]/mL (ref 0.450–4.500)

## 2019-09-26 LAB — VITAMIN D 25 HYDROXY (VIT D DEFICIENCY, FRACTURES): Vit D, 25-Hydroxy: 66.8 ng/mL (ref 30.0–100.0)

## 2019-09-26 MED ORDER — SODIUM POLYSTYRENE SULFONATE 15 GM/60ML PO SUSP: 15.0000 g | Freq: Once | ORAL | 0 refills | Status: AC

## 2019-09-26 NOTE — Addendum Note (Signed)
Addended by: Truddie Hidden on: 09/26/2019 12:02 PM   Modules accepted: Orders

## 2019-09-29 ENCOUNTER — Telehealth: Payer: Self-pay

## 2019-09-29 MED ORDER — SODIUM POLYSTYRENE SULFONATE 15 GM/60ML PO SUSP: 15.0000 g | Freq: Once | ORAL | 0 refills | Status: AC

## 2019-09-29 NOTE — Telephone Encounter (Signed)
Rx called to [facility]. Pt and family aware.

## 2019-09-29 NOTE — Addendum Note (Signed)
Addended by: Truddie Hidden on: 09/29/2019 04:34 PM   Modules accepted: Orders

## 2019-09-29 NOTE — Telephone Encounter (Signed)
Spoke with pt's sister who states that she cannot find the Kayexlate that the pt is needing. [place] who states they will order the medication.  Family aware and will pick up 09/30/19.

## 2019-09-30 DIAGNOSIS — E785 Hyperlipidemia, unspecified: Secondary | ICD-10-CM | POA: Diagnosis not present

## 2019-09-30 DIAGNOSIS — Z8709 Personal history of other diseases of the respiratory system: Secondary | ICD-10-CM | POA: Diagnosis not present

## 2019-09-30 DIAGNOSIS — E559 Vitamin D deficiency, unspecified: Secondary | ICD-10-CM | POA: Diagnosis not present

## 2019-09-30 DIAGNOSIS — I10 Essential (primary) hypertension: Secondary | ICD-10-CM | POA: Diagnosis not present

## 2019-10-01 DIAGNOSIS — E785 Hyperlipidemia, unspecified: Secondary | ICD-10-CM | POA: Diagnosis not present

## 2019-10-01 DIAGNOSIS — Z79899 Other long term (current) drug therapy: Secondary | ICD-10-CM | POA: Diagnosis not present

## 2019-10-01 DIAGNOSIS — E559 Vitamin D deficiency, unspecified: Secondary | ICD-10-CM | POA: Diagnosis not present

## 2019-11-30 ENCOUNTER — Other Ambulatory Visit: Payer: Self-pay | Admitting: Cardiology

## 2019-12-09 ENCOUNTER — Other Ambulatory Visit: Payer: Self-pay | Admitting: Cardiology

## 2019-12-09 MED ORDER — RIVAROXABAN 20 MG PO TABS: ORAL_TABLET | ORAL | 2 refills | Status: DC

## 2019-12-09 MED ORDER — NITROGLYCERIN 0.4 MG SL SUBL: 0.4000 mg | SUBLINGUAL_TABLET | SUBLINGUAL | 11 refills | Status: DC | PRN

## 2019-12-09 NOTE — Telephone Encounter (Signed)
*  STAT* If patient is at the pharmacy, call can be transferred to refill team.   1. Which medications need to be refilled? (please list name of each medication and dose if known)  XARELTO 20 MG TABS tablet nitroGLYCERIN (NITROSTAT) 0.4 MG SL tablet 2. Which pharmacy/location (including street and city if local pharmacy) is medication to be sent to? [id number] [place], [place] - [address]  3. Do they need a 30 day or 90 day supply? 90 day supply

## 2019-12-09 NOTE — Telephone Encounter (Signed)
Refill sent in per request.  

## 2020-01-06 DIAGNOSIS — E559 Vitamin D deficiency, unspecified: Secondary | ICD-10-CM | POA: Diagnosis not present

## 2020-01-06 DIAGNOSIS — J309 Allergic rhinitis, unspecified: Secondary | ICD-10-CM | POA: Diagnosis not present

## 2020-01-06 DIAGNOSIS — I10 Essential (primary) hypertension: Secondary | ICD-10-CM | POA: Diagnosis not present

## 2020-01-06 DIAGNOSIS — E785 Hyperlipidemia, unspecified: Secondary | ICD-10-CM | POA: Diagnosis not present

## 2020-01-06 DIAGNOSIS — K5909 Other constipation: Secondary | ICD-10-CM | POA: Diagnosis not present

## 2020-01-06 DIAGNOSIS — Z6824 Body mass index (BMI) 24.0-24.9, adult: Secondary | ICD-10-CM | POA: Diagnosis not present

## 2020-01-06 DIAGNOSIS — K219 Gastro-esophageal reflux disease without esophagitis: Secondary | ICD-10-CM | POA: Diagnosis not present

## 2020-01-06 DIAGNOSIS — R600 Localized edema: Secondary | ICD-10-CM | POA: Diagnosis not present

## 2020-01-06 DIAGNOSIS — Z8709 Personal history of other diseases of the respiratory system: Secondary | ICD-10-CM | POA: Diagnosis not present

## 2020-03-09 ENCOUNTER — Other Ambulatory Visit: Payer: Self-pay

## 2020-03-09 DIAGNOSIS — C569 Malignant neoplasm of unspecified ovary: Secondary | ICD-10-CM | POA: Insufficient documentation

## 2020-03-09 DIAGNOSIS — M1990 Unspecified osteoarthritis, unspecified site: Secondary | ICD-10-CM | POA: Insufficient documentation

## 2020-03-09 DIAGNOSIS — F39 Unspecified mood [affective] disorder: Secondary | ICD-10-CM | POA: Insufficient documentation

## 2020-03-09 DIAGNOSIS — E785 Hyperlipidemia, unspecified: Secondary | ICD-10-CM | POA: Insufficient documentation

## 2020-03-09 DIAGNOSIS — R0602 Shortness of breath: Secondary | ICD-10-CM | POA: Insufficient documentation

## 2020-03-09 DIAGNOSIS — R011 Cardiac murmur, unspecified: Secondary | ICD-10-CM | POA: Insufficient documentation

## 2020-03-09 DIAGNOSIS — I499 Cardiac arrhythmia, unspecified: Secondary | ICD-10-CM | POA: Insufficient documentation

## 2020-03-09 DIAGNOSIS — I10 Essential (primary) hypertension: Secondary | ICD-10-CM | POA: Insufficient documentation

## 2020-03-10 ENCOUNTER — Ambulatory Visit (INDEPENDENT_AMBULATORY_CARE_PROVIDER_SITE_OTHER): Payer: Medicare Other | Admitting: Cardiology

## 2020-03-10 ENCOUNTER — Encounter: Payer: Self-pay | Admitting: Cardiology

## 2020-03-10 ENCOUNTER — Other Ambulatory Visit: Payer: Self-pay

## 2020-03-10 VITALS — BP 121/73 | HR 86 | Ht 65.0 in | Wt 145.6 lb

## 2020-03-10 DIAGNOSIS — E782 Mixed hyperlipidemia: Secondary | ICD-10-CM

## 2020-03-10 DIAGNOSIS — I480 Paroxysmal atrial fibrillation: Secondary | ICD-10-CM | POA: Diagnosis not present

## 2020-03-10 DIAGNOSIS — I714 Abdominal aortic aneurysm, without rupture, unspecified: Secondary | ICD-10-CM

## 2020-03-10 DIAGNOSIS — E785 Hyperlipidemia, unspecified: Secondary | ICD-10-CM

## 2020-03-10 NOTE — Progress Notes (Signed)
Cardiology Office Note:    Date:  03/10/2020   ID:  Adriana Castro, DOB 04-Dec-1928, MRN [id number]  PCP:  Nicholos Johns, MD  Cardiologist:  Jenean Lindau, MD   Referring MD: Nicholos Johns, MD    ASSESSMENT:    1. Abdominal aortic aneurysm without rupture (HCC)   2. Paroxysmal atrial fibrillation ([place])   3. Dyslipidemia    PLAN:    In order of problems listed above:  1. Secondary prevention stressed with the patient.  Importance of compliance with diet medication stressed and she vocalized understanding. 2. Atrial fibrillation: Persistent:I discussed with the patient atrial fibrillation, disease process. Management and therapy including rate and rhythm control, anticoagulation benefits and potential risks were discussed extensively with the patient. Patient had multiple questions which were answered to patient's satisfaction. 3. Mixed dyslipidemia: On statin therapy and we will do a lipid check today. 4. Abdominal aortic aneurysm: Significant.  Followed by vascular surgery and deemed to risk for any invasive intervention.  Patient is also of the same opinion.  Medical management. 5. Patient and daughter had multiple questions which were answered to their satisfaction.Patient will be seen in follow-up appointment in 6 months or earlier if the patient has any concerns    Medication Adjustments/Labs and Tests Ordered: Current medicines are reviewed at length with the patient today.  Concerns regarding medicines are outlined above.  No orders of the defined types were placed in this encounter.  No orders of the defined types were placed in this encounter.    No chief complaint on file.    History of Present Illness:    Adriana Castro is a 86 y.o. female.  Patient has past medical history of essential hypertension, dyslipidemia and abdominal aortic aneurysm and atrial fibrillation.  She denies any problems at this time and takes care of activities of daily living.  She ambulates age  appropriately.  No chest pain orthopnea or PND.  No abdominal pain.  At the time of my evaluation, the patient is alert awake oriented and in no distress.  Past Medical History:  Diagnosis Date  . Abdominal aortic aneurysm without rupture ([place]) 01/06/2016  . Adrenal adenoma 09/10/2013   Overview:  Left, unchanged per CT 09/10/2013  Formatting of this note might be different from the original. Left, unchanged per CT 09/10/2013  . Aneurysm ([place]) 09/10/2013   Formatting of this note might be different from the original. Infrarenal aortic aneurysm 4.7 cm in greatest anteroposterior dimensions per CT ( 09/10/2013 )  . Anxiety   . Anxiety   . Arthritis   . Atrial fibrillation ([place]) 09/10/2013   Formatting of this note might be different from the original. f/b Dr. Geraldo Pitter ( [place], [place] ) Xarelto 15mg  daily (decreased from 20mg  daily 4/25) Diltiazem 120 BID Amioderone 200mg  daily added 4/25  . Bilateral carotid artery stenosis 01/06/2016  . Chest pain 02/05/2013  . Closed bicondylar fracture of distal end of left humerus   . COPD (chronic obstructive pulmonary disease) ([place])   . Depression 09/10/2013  . Dyslipidemia   . Dysrhythmia    ATRIAL FIBRILATION  . Encounter for antineoplastic chemotherapy 11/09/2013  . GERD (gastroesophageal reflux disease)   . Heart murmur   . History of open reduction and internal fixation (ORIF) procedure 11/01/2016  . HTN (hypertension) 09/10/2013  . Hyperlipidemia 09/10/2013  . Hypertension   . Hypoxia 02/05/2013  . Irregular heart beat   . Leaky heart valve   . Malignant neoplasm of ovary ([place]) 09/28/2013  Overview:  S/p 4 cycles neoadjuvant chemotherapy 09/23/12 exploratory laparotomy, TAH/BSO, omentectomy  . Malignant ovarian neoplasm ([place]) 09/28/2013   Formatting of this note might be different from the original. S/p 4 cycles neoadjuvant chemotherapy 09/23/12 exploratory laparotomy, TAH/BSO, omentectomy  . Mood disorder ([place])   . Ovarian cancer ([place])   . Shortness of breath      Past Surgical History:  Procedure Laterality Date  . BREAST BIOPSY    . CATARACT EXTRACTION, BILATERAL    . ORIF HUMERUS FRACTURE Left 11/01/2016   Procedure: OPEN REDUCTION INTERNAL FIXATION (ORIF) LEFT SUPRACONDYLAR HUMERUS AND MEDIAL EPICONDYLE;  Surgeon: Leandrew Koyanagi, MD;  Location: [facility];  Service: Orthopedics;  Laterality: Left;  . SHOULDER SURGERY Right    ?   2002     . SKIN CANCER EXCISION     X 5  . TONSILLECTOMY      Current Medications: No outpatient medications have been marked as taking for the 03/10/20 encounter (Office Visit) with Gregrey Bloyd, Reita Cliche, MD.     Allergies:   Albuterol   Social History   Socioeconomic History  . Marital status: Widowed    Spouse name: Not on file  . Number of children: Not on file  . Years of education: Not on file  . Highest education level: Not on file  Occupational History  . Not on file  Tobacco Use  . Smoking status: Former Smoker    Quit date: 06/07/2012    Years since quitting: 7.7  . Smokeless tobacco: Never Used  Vaping Use  . Vaping Use: Never used  Substance and Sexual Activity  . Alcohol use: No  . Drug use: No  . Sexual activity: Not on file  Other Topics Concern  . Not on file  Social History Narrative  . Not on file   Social Determinants of Health   Financial Resource Strain:   . Difficulty of Paying Living Expenses: Not on file  Food Insecurity:   . Worried About Charity fundraiser in the Last Year: Not on file  . Ran Out of Food in the Last Year: Not on file  Transportation Needs:   . Lack of Transportation (Medical): Not on file  . Lack of Transportation (Non-Medical): Not on file  Physical Activity:   . Days of Exercise per Week: Not on file  . Minutes of Exercise per Session: Not on file  Stress:   . Feeling of Stress : Not on file  Social Connections:   . Frequency of Communication with Friends and Family: Not on file  . Frequency of Social Gatherings with Friends and Family: Not on file   . Attends Religious Services: Not on file  . Active Member of Clubs or Organizations: Not on file  . Attends Archivist Meetings: Not on file  . Marital Status: Not on file     Family History: The patient's family history includes Asthma in her father; Breast cancer in her sister; Diabetes in her brother and mother; Heart attack in her mother; Heart disease in her mother; Hypertension in her mother; Prostate cancer in her brother; Stroke in her brother and father.  ROS:   Please see the history of present illness.    All other systems reviewed and are negative.  EKGs/Labs/Other Studies Reviewed:    The following studies were reviewed today: Summary:  Abdominal Aorta: There is evidence of abnormal dilatation of the distal  Abdominal aorta. The largest aortic measurement is 5.9 cm. No  previous  exam available for comparison.   Previous CTA diameter 4.6 cm in 03/01/2016    Recent Labs: 09/25/2019: ALT 10; BUN 21; Creatinine, Ser 1.13; Hemoglobin 13.5; Platelets 272; Potassium 5.9; Sodium 142; TSH 2.460  Recent Lipid Panel    Component Value Date/Time   CHOL 167 09/25/2019 1122   TRIG 143 09/25/2019 1122   HDL 47 09/25/2019 1122   CHOLHDL 3.6 09/25/2019 1122   LDLCALC 95 09/25/2019 1122    Physical Exam:    VS:  There were no vitals taken for this visit.    Wt Readings from Last 3 Encounters:  09/25/19 150 lb 3.2 oz (68.1 kg)  03/26/19 140 lb (63.5 kg)  03/10/19 141 lb (64 kg)     GEN: Patient is in no acute distress HEENT: Normal NECK: No JVD; No carotid bruits LYMPHATICS: No lymphadenopathy CARDIAC: Hear sounds regular, 2/6 systolic murmur at the apex. RESPIRATORY:  Clear to auscultation without rales, wheezing or rhonchi  ABDOMEN: Soft, non-tender, non-distended MUSCULOSKELETAL:  No edema; No deformity  SKIN: Warm and dry NEUROLOGIC:  Alert and oriented x 3 PSYCHIATRIC:  Normal affect   Signed, Jenean Lindau, MD  03/10/2020 2:04 PM    [place]

## 2020-03-10 NOTE — Patient Instructions (Signed)
Medication Instructions:  No medication changes. *If you need a refill on your cardiac medications before your next appointment, please call your pharmacy*   Lab Work: None ordered If you have labs (blood work) drawn today and your tests are completely normal, you will receive your results only by: Marland Kitchen MyChart Message (if you have MyChart) OR . A paper copy in the mail If you have any lab test that is abnormal or we need to change your treatment, we will call you to review the results.   Testing/Procedures: Your physician recommends that you have labs done in the office today. Your test included  basic metabolic panel, complete blood count, TSH, liver function and lipids.    Follow-Up: At [facility], you and your health needs are our priority.  As part of our continuing mission to provide you with exceptional heart care, we have created designated Provider Care Teams.  These Care Teams include your primary Cardiologist (physician) and Advanced Practice Providers (APPs -  Physician Assistants and Nurse Practitioners) who all work together to provide you with the care you need, when you need it.  We recommend signing up for the patient portal called "MyChart".  Sign up information is provided on this After Visit Summary.  MyChart is used to connect with patients for Virtual Visits (Telemedicine).  Patients are able to view lab/test results, encounter notes, upcoming appointments, etc.  Non-urgent messages can be sent to your provider as well.   To learn more about what you can do with MyChart, go to [email or web address].    Your next appointment:   6 month(s)  The format for your next appointment:   In Person  Provider:   Jyl Heinz, MD   Other Instructions NA

## 2020-03-11 LAB — CBC WITH DIFFERENTIAL/PLATELET
Basophils Absolute: 0.1 10*3/uL (ref 0.0–0.2)
Basos: 1 %
EOS (ABSOLUTE): 0.2 10*3/uL (ref 0.0–0.4)
Eos: 2 %
Hematocrit: 39.3 % (ref 34.0–46.6)
Hemoglobin: 12.7 g/dL (ref 11.1–15.9)
Immature Grans (Abs): 0 10*3/uL (ref 0.0–0.1)
Immature Granulocytes: 0 %
Lymphocytes Absolute: 1.3 10*3/uL (ref 0.7–3.1)
Lymphs: 17 %
MCH: 28.9 pg (ref 26.6–33.0)
MCHC: 32.3 g/dL (ref 31.5–35.7)
MCV: 89 fL (ref 79–97)
Monocytes Absolute: 0.7 10*3/uL (ref 0.1–0.9)
Monocytes: 9 %
Neutrophils Absolute: 5.4 10*3/uL (ref 1.4–7.0)
Neutrophils: 71 %
Platelets: 280 10*3/uL (ref 150–450)
RBC: 4.4 x10E6/uL (ref 3.77–5.28)
RDW: 15.3 % (ref 11.7–15.4)
WBC: 7.7 10*3/uL (ref 3.4–10.8)

## 2020-03-11 LAB — LIPID PANEL
Chol/HDL Ratio: 3.3 ratio (ref 0.0–4.4)
Cholesterol, Total: 139 mg/dL (ref 100–199)
HDL: 42 mg/dL (ref >39)
LDL Chol Calc (NIH): 76 mg/dL (ref 0–99)
Triglycerides: 119 mg/dL (ref 0–149)
VLDL Cholesterol Cal: 21 mg/dL (ref 5–40)

## 2020-03-11 LAB — HEPATIC FUNCTION PANEL
ALT: 17 IU/L (ref 0–32)
AST: 17 IU/L (ref 0–40)
Albumin: 4.3 g/dL (ref 3.6–4.6)
Alkaline Phosphatase: 125 IU/L — ABNORMAL HIGH (ref 44–121)
Bilirubin Total: 0.3 mg/dL (ref 0.0–1.2)
Bilirubin, Direct: <0.1 mg/dL (ref 0.00–0.40)
Total Protein: 6.8 g/dL (ref 6.0–8.5)

## 2020-03-11 LAB — BASIC METABOLIC PANEL
BUN/Creatinine Ratio: 14 (ref 12–28)
BUN: 22 mg/dL (ref 8–27)
CO2: 26 mmol/L (ref 20–29)
Calcium: 10 mg/dL (ref 8.7–10.3)
Chloride: 100 mmol/L (ref 96–106)
Creatinine, Ser: 1.52 mg/dL — ABNORMAL HIGH (ref 0.57–1.00)
GFR calc Af Amer: 35 mL/min/{1.73_m2} — ABNORMAL LOW (ref >59)
GFR calc non Af Amer: 30 mL/min/{1.73_m2} — ABNORMAL LOW (ref >59)
Glucose: 87 mg/dL (ref 65–99)
Potassium: 4.8 mmol/L (ref 3.5–5.2)
Sodium: 142 mmol/L (ref 134–144)

## 2020-03-11 LAB — TSH: TSH: 2.89 u[IU]/mL (ref 0.450–4.500)

## 2020-03-31 DIAGNOSIS — Z23 Encounter for immunization: Secondary | ICD-10-CM | POA: Diagnosis not present

## 2020-04-08 DIAGNOSIS — K219 Gastro-esophageal reflux disease without esophagitis: Secondary | ICD-10-CM | POA: Diagnosis not present

## 2020-04-08 DIAGNOSIS — E785 Hyperlipidemia, unspecified: Secondary | ICD-10-CM | POA: Diagnosis not present

## 2020-04-08 DIAGNOSIS — E559 Vitamin D deficiency, unspecified: Secondary | ICD-10-CM | POA: Diagnosis not present

## 2020-04-08 DIAGNOSIS — Z6824 Body mass index (BMI) 24.0-24.9, adult: Secondary | ICD-10-CM | POA: Diagnosis not present

## 2020-04-08 DIAGNOSIS — R600 Localized edema: Secondary | ICD-10-CM | POA: Diagnosis not present

## 2020-04-08 DIAGNOSIS — J309 Allergic rhinitis, unspecified: Secondary | ICD-10-CM | POA: Diagnosis not present

## 2020-04-08 DIAGNOSIS — K5909 Other constipation: Secondary | ICD-10-CM | POA: Diagnosis not present

## 2020-04-08 DIAGNOSIS — I10 Essential (primary) hypertension: Secondary | ICD-10-CM | POA: Diagnosis not present

## 2020-04-08 DIAGNOSIS — Z8709 Personal history of other diseases of the respiratory system: Secondary | ICD-10-CM | POA: Diagnosis not present

## 2020-04-17 ENCOUNTER — Other Ambulatory Visit: Payer: Self-pay | Admitting: Cardiology

## 2020-06-01 DIAGNOSIS — Z9181 History of falling: Secondary | ICD-10-CM | POA: Diagnosis not present

## 2020-06-01 DIAGNOSIS — Z139 Encounter for screening, unspecified: Secondary | ICD-10-CM | POA: Diagnosis not present

## 2020-06-01 DIAGNOSIS — E785 Hyperlipidemia, unspecified: Secondary | ICD-10-CM | POA: Diagnosis not present

## 2020-06-01 DIAGNOSIS — Z0000 Encounter for general adult medical examination without abnormal findings: Secondary | ICD-10-CM | POA: Diagnosis not present

## 2020-06-01 DIAGNOSIS — Z1331 Encounter for screening for depression: Secondary | ICD-10-CM | POA: Diagnosis not present

## 2020-07-11 ENCOUNTER — Other Ambulatory Visit: Payer: Self-pay | Admitting: Cardiology

## 2020-08-03 DIAGNOSIS — I10 Essential (primary) hypertension: Secondary | ICD-10-CM | POA: Diagnosis not present

## 2020-08-03 DIAGNOSIS — R600 Localized edema: Secondary | ICD-10-CM | POA: Diagnosis not present

## 2020-08-03 DIAGNOSIS — Z6824 Body mass index (BMI) 24.0-24.9, adult: Secondary | ICD-10-CM | POA: Diagnosis not present

## 2020-08-03 DIAGNOSIS — E559 Vitamin D deficiency, unspecified: Secondary | ICD-10-CM | POA: Diagnosis not present

## 2020-08-03 DIAGNOSIS — E785 Hyperlipidemia, unspecified: Secondary | ICD-10-CM | POA: Diagnosis not present

## 2020-08-03 DIAGNOSIS — K5909 Other constipation: Secondary | ICD-10-CM | POA: Diagnosis not present

## 2020-08-03 DIAGNOSIS — J309 Allergic rhinitis, unspecified: Secondary | ICD-10-CM | POA: Diagnosis not present

## 2020-08-03 DIAGNOSIS — E538 Deficiency of other specified B group vitamins: Secondary | ICD-10-CM | POA: Diagnosis not present

## 2020-08-03 DIAGNOSIS — Z79899 Other long term (current) drug therapy: Secondary | ICD-10-CM | POA: Diagnosis not present

## 2020-08-03 DIAGNOSIS — K219 Gastro-esophageal reflux disease without esophagitis: Secondary | ICD-10-CM | POA: Diagnosis not present

## 2020-08-03 DIAGNOSIS — D509 Iron deficiency anemia, unspecified: Secondary | ICD-10-CM | POA: Diagnosis not present

## 2020-08-03 DIAGNOSIS — Z8709 Personal history of other diseases of the respiratory system: Secondary | ICD-10-CM | POA: Diagnosis not present

## 2020-08-18 DIAGNOSIS — E538 Deficiency of other specified B group vitamins: Secondary | ICD-10-CM | POA: Diagnosis not present

## 2020-08-18 DIAGNOSIS — D509 Iron deficiency anemia, unspecified: Secondary | ICD-10-CM | POA: Diagnosis not present

## 2020-09-09 ENCOUNTER — Ambulatory Visit (INDEPENDENT_AMBULATORY_CARE_PROVIDER_SITE_OTHER): Payer: Medicare Other | Admitting: Cardiology

## 2020-09-09 ENCOUNTER — Encounter: Payer: Self-pay | Admitting: Cardiology

## 2020-09-09 ENCOUNTER — Other Ambulatory Visit: Payer: Self-pay

## 2020-09-09 VITALS — BP 124/60 | HR 76 | Ht 66.0 in | Wt 145.0 lb

## 2020-09-09 DIAGNOSIS — I10 Essential (primary) hypertension: Secondary | ICD-10-CM | POA: Diagnosis not present

## 2020-09-09 DIAGNOSIS — I714 Abdominal aortic aneurysm, without rupture, unspecified: Secondary | ICD-10-CM

## 2020-09-09 DIAGNOSIS — E782 Mixed hyperlipidemia: Secondary | ICD-10-CM | POA: Diagnosis not present

## 2020-09-09 DIAGNOSIS — I4891 Unspecified atrial fibrillation: Secondary | ICD-10-CM

## 2020-09-09 MED ORDER — RIVAROXABAN 15 MG PO TABS: 15.0000 mg | ORAL_TABLET | Freq: Every day | ORAL | 3 refills | Status: DC

## 2020-09-09 MED ORDER — RIVAROXABAN 15 MG PO TABS: 15.0000 mg | ORAL_TABLET | Freq: Every day | ORAL | 0 refills | Status: AC

## 2020-09-09 NOTE — Progress Notes (Signed)
Cardiology Office Note:    Date:  09/09/2020   ID:  Adriana Castro, DOB 1928/10/27, MRN [id number]  PCP:  Adriana Johns, MD  Cardiologist:  Adriana Lindau, MD   Referring MD: Adriana Johns, MD    ASSESSMENT:    1. Abdominal aortic aneurysm without rupture (Adriana Castro)   2. Primary hypertension   3. Mixed hyperlipidemia   4. Atrial fibrillation, unspecified type (Adriana Castro)    PLAN:    In order of problems listed above:  1. Primary prevention stressed with the patient.  Importance of compliance with diet medication stressed and she vocalized understanding. 2. Atrial fibrillation:I discussed with the patient atrial fibrillation, disease process. Management and therapy including rate and rhythm control, anticoagulation benefits and potential risks were discussed extensively with the patient. Patient had multiple questions which were answered to patient's satisfaction.  We will check the dose of Xarelto and made adjustment in reduce the dose to 15 mg daily. 3. Abdominal aortic ultrasound: Followed by her vascular colleagues.  She is deemed to be too high risk for any surgical intervention. 4. Mixed dyslipidemia: Not on lipid-lowering therapy per the patient's wishes.  She is vehemently against taking them.  Risks explained and she vocalized understanding.  Especially in view of the aneurysm issues.  She understands.  Her daughter was present for the discussion. 5. Patient will be seen in follow-up appointment in 6 months or earlier if the patient has any concerns    Medication Adjustments/Labs and Tests Ordered: Current medicines are reviewed at length with the patient today.  Concerns regarding medicines are outlined above.  No orders of the defined types were placed in this encounter.  Meds ordered this encounter  Medications  . rivaroxaban (XARELTO) 15 MG TABS tablet    Sig: Take 1 tablet (15 mg total) by mouth daily with supper.    Dispense:  90 tablet    Refill:  3     No chief complaint  on file.    History of Present Illness:    Adriana Castro is a 87 y.o. female.  Patient has past medical history of atrial fibrillation, essential hypertension dyslipidemia and abdominal aortic aneurysm.  She denies any problems at this time and takes care of activities of daily living.  No chest pain orthopnea or PND.  She tells me that she had lipid-lowering medications and significant hair loss and she quit taking that medication.  She does not want any lipid-lowering medications at this time.  Past Medical History:  Diagnosis Date  . Abdominal aortic aneurysm without rupture (Adriana Castro) 01/06/2016  . Adrenal adenoma 09/10/2013   Overview:  Left, unchanged per CT 09/10/2013  Formatting of this note might be different from the original. Left, unchanged per CT 09/10/2013  . Aneurysm ([place]) 09/10/2013   Formatting of this note might be different from the original. Infrarenal aortic aneurysm 4.7 cm in greatest anteroposterior dimensions per CT ( 09/10/2013 )  . Anxiety   . Anxiety   . Arthritis   . Atrial fibrillation ([place]) 09/10/2013   Formatting of this note might be different from the original. f/b Dr. Geraldo Castro ( [place], [place] ) Xarelto 15mg  daily (decreased from 20mg  daily 4/25) Diltiazem 120 BID Amioderone 200mg  daily added 4/25  . Bilateral carotid artery stenosis 01/06/2016  . Chest pain 02/05/2013  . Closed bicondylar fracture of distal end of left humerus   . COPD (chronic obstructive pulmonary disease) ([place])   . Depression 09/10/2013  . Dyslipidemia   . Dysrhythmia  ATRIAL FIBRILATION  . Encounter for antineoplastic chemotherapy 11/09/2013  . GERD (gastroesophageal reflux disease)   . Heart murmur   . History of open reduction and internal fixation (ORIF) procedure 11/01/2016  . HTN (hypertension) 09/10/2013  . Hyperlipidemia 09/10/2013  . Hypertension   . Hypoxia 02/05/2013  . Irregular heart beat   . Leaky heart valve   . Malignant neoplasm of ovary ([place]) 09/28/2013   Overview:  S/p 4 cycles neoadjuvant  chemotherapy 09/23/12 exploratory laparotomy, TAH/BSO, omentectomy  . Malignant ovarian neoplasm ([place]) 09/28/2013   Formatting of this note might be different from the original. S/p 4 cycles neoadjuvant chemotherapy 09/23/12 exploratory laparotomy, TAH/BSO, omentectomy  . Mood disorder ([place])   . Ovarian cancer ([place])   . Shortness of breath     Past Surgical History:  Procedure Laterality Date  . BREAST BIOPSY    . CATARACT EXTRACTION, BILATERAL    . ORIF HUMERUS FRACTURE Left 11/01/2016   Procedure: OPEN REDUCTION INTERNAL FIXATION (ORIF) LEFT SUPRACONDYLAR HUMERUS AND MEDIAL EPICONDYLE;  Surgeon: Adriana Koyanagi, MD;  Location: [facility];  Service: Orthopedics;  Laterality: Left;  . SHOULDER SURGERY Right    ?   2002     . SKIN CANCER EXCISION     X 5  . TONSILLECTOMY      Current Medications: Current Meds  Medication Sig  . dicyclomine (BENTYL) 20 MG tablet Take 10 mg by mouth as needed for pain.  Marland Kitchen digoxin (LANOXIN) 0.125 MG tablet Take 125 mcg by mouth 3 (three) times a week. On Monday, Wednesday, and Friday  . diltiazem (CARDIZEM CD) 120 MG 24 hr capsule Take 120 mg by mouth 2 (two) times daily.  . fluticasone (FLONASE) 50 MCG/ACT nasal spray Place 1 spray into both nostrils daily as needed for allergies.  . furosemide (LASIX) 20 MG tablet Take 20 mg by mouth daily.  . INCRUSE ELLIPTA 62.5 MCG/INH AEPB Inhale 2 puffs into the lungs daily.  . montelukast (SINGULAIR) 10 MG tablet Take 10 mg by mouth every evening.  . nitroGLYCERIN (NITROSTAT) 0.4 MG SL tablet Place 0.4 mg under the tongue every 5 (five) minutes as needed for chest pain.  Marland Kitchen omeprazole (PRILOSEC) 20 MG capsule Take 20 mg by mouth as needed (reflux).  . Vitamin D, Ergocalciferol, (DRISDOL) 1.25 MG (50000 UNIT) CAPS capsule Take 50,000 Units by mouth every 7 (seven) days.  . vitamin E 400 UNIT capsule Take 800 Units by mouth daily.  Penne Lash HFA 45 MCG/ACT inhaler Inhale 2 puffs into the lungs in the morning, at noon, in the  evening, and at bedtime.  . [DISCONTINUED] rivaroxaban (XARELTO) 20 MG TABS tablet Take 20 mg by mouth daily with supper.     Allergies:   Albuterol   Social History   Socioeconomic History  . Marital status: Widowed    Spouse name: Not on file  . Number of children: Not on file  . Years of education: Not on file  . Highest education level: Not on file  Occupational History  . Not on file  Tobacco Use  . Smoking status: Former Smoker    Quit date: 06/07/2012    Years since quitting: 8.2  . Smokeless tobacco: Never Used  Vaping Use  . Vaping Use: Never used  Substance and Sexual Activity  . Alcohol use: No  . Drug use: No  . Sexual activity: Not on file  Other Topics Concern  . Not on file  Social History Narrative  . Not  on file   Social Determinants of Health   Financial Resource Strain: Not on file  Food Insecurity: Not on file  Transportation Needs: Not on file  Physical Activity: Not on file  Stress: Not on file  Social Connections: Not on file     Family History: The patient's family history includes Asthma in her father; Breast cancer in her sister; Diabetes in her brother and mother; Heart attack in her mother; Heart disease in her mother; Hypertension in her mother; Prostate cancer in her brother; Stroke in her brother and father.  ROS:   Please see the history of present illness.    All other systems reviewed and are negative.  EKGs/Labs/Other Studies Reviewed:    The following studies were reviewed today: I reviewed records and also recent lab work with [facility]: 03/10/2020: ALT 17; BUN 22; Creatinine, Ser 1.52; Hemoglobin 12.7; Platelets 280; Potassium 4.8; Sodium 142; TSH 2.890  Recent Lipid Panel    Component Value Date/Time   CHOL 139 03/10/2020 1428   TRIG 119 03/10/2020 1428   HDL 42 03/10/2020 1428   CHOLHDL 3.3 03/10/2020 1428   LDLCALC 76 03/10/2020 1428    Physical Exam:    VS:  BP 124/60   Pulse 76   Ht 5\' 6"   (1.676 m)   Wt 145 lb (65.8 kg)   SpO2 93%   BMI 23.40 kg/m     Wt Readings from Last 3 Encounters:  09/09/20 145 lb (65.8 kg)  03/10/20 145 lb 9.6 oz (66 kg)  09/25/19 150 lb 3.2 oz (68.1 kg)     GEN: Patient is in no acute distress HEENT: Normal NECK: No JVD; No carotid bruits LYMPHATICS: No lymphadenopathy CARDIAC: Hear sounds regular, 2/6 systolic murmur at the apex. RESPIRATORY:  Clear to auscultation without rales, wheezing or rhonchi  ABDOMEN: Soft, non-tender, non-distended MUSCULOSKELETAL:  No edema; No deformity  SKIN: Warm and dry NEUROLOGIC:  Alert and oriented x 3 PSYCHIATRIC:  Normal affect   Signed, Adriana Lindau, MD  09/09/2020 9:52 AM    [place]

## 2020-09-09 NOTE — Patient Instructions (Signed)
Medication Instructions:  Your physician has recommended you make the following change in your medication:   Xarelto 15 mg daily with the largest meal.  *If you need a refill on your cardiac medications before your next appointment, please call your pharmacy*   Lab Work: None ordered If you have labs (blood work) drawn today and your tests are completely normal, you will receive your results only by: Marland Kitchen MyChart Message (if you have MyChart) OR . A paper copy in the mail If you have any lab test that is abnormal or we need to change your treatment, we will call you to review the results.   Testing/Procedures: None ordered   Follow-Up: At [facility], you and your health needs are our priority.  As part of our continuing mission to provide you with exceptional heart care, we have created designated Provider Care Teams.  These Care Teams include your primary Cardiologist (physician) and Advanced Practice Providers (APPs -  Physician Assistants and Nurse Practitioners) who all work together to provide you with the care you need, when you need it.  We recommend signing up for the patient portal called "MyChart".  Sign up information is provided on this After Visit Summary.  MyChart is used to connect with patients for Virtual Visits (Telemedicine).  Patients are able to view lab/test results, encounter notes, upcoming appointments, etc.  Non-urgent messages can be sent to your provider as well.   To learn more about what you can do with MyChart, go to [email or web address].    Your next appointment:   6 month(s)  The format for your next appointment:   In Person  Provider:   Jyl Heinz, MD   Other Instructions NA

## 2020-09-20 DIAGNOSIS — E538 Deficiency of other specified B group vitamins: Secondary | ICD-10-CM | POA: Diagnosis not present

## 2020-11-04 DIAGNOSIS — E559 Vitamin D deficiency, unspecified: Secondary | ICD-10-CM | POA: Diagnosis not present

## 2020-11-04 DIAGNOSIS — Z6824 Body mass index (BMI) 24.0-24.9, adult: Secondary | ICD-10-CM | POA: Diagnosis not present

## 2020-11-04 DIAGNOSIS — Z79899 Other long term (current) drug therapy: Secondary | ICD-10-CM | POA: Diagnosis not present

## 2020-11-04 DIAGNOSIS — K219 Gastro-esophageal reflux disease without esophagitis: Secondary | ICD-10-CM | POA: Diagnosis not present

## 2020-11-04 DIAGNOSIS — R600 Localized edema: Secondary | ICD-10-CM | POA: Diagnosis not present

## 2020-11-04 DIAGNOSIS — E785 Hyperlipidemia, unspecified: Secondary | ICD-10-CM | POA: Diagnosis not present

## 2020-11-04 DIAGNOSIS — J440 Chronic obstructive pulmonary disease with acute lower respiratory infection: Secondary | ICD-10-CM | POA: Diagnosis not present

## 2020-11-04 DIAGNOSIS — K5909 Other constipation: Secondary | ICD-10-CM | POA: Diagnosis not present

## 2020-11-04 DIAGNOSIS — J309 Allergic rhinitis, unspecified: Secondary | ICD-10-CM | POA: Diagnosis not present

## 2020-11-04 DIAGNOSIS — J209 Acute bronchitis, unspecified: Secondary | ICD-10-CM | POA: Diagnosis not present

## 2020-11-04 DIAGNOSIS — Z8709 Personal history of other diseases of the respiratory system: Secondary | ICD-10-CM | POA: Diagnosis not present

## 2020-11-04 DIAGNOSIS — I10 Essential (primary) hypertension: Secondary | ICD-10-CM | POA: Diagnosis not present

## 2020-12-06 ENCOUNTER — Other Ambulatory Visit: Payer: Self-pay | Admitting: Cardiology

## 2020-12-08 NOTE — Telephone Encounter (Signed)
Nitrostat 0.4 mg sl tablets # 75 sent to [id number] - [place], [place] - [address]

## 2021-01-13 DIAGNOSIS — E559 Vitamin D deficiency, unspecified: Secondary | ICD-10-CM | POA: Diagnosis not present

## 2021-01-13 DIAGNOSIS — Z79899 Other long term (current) drug therapy: Secondary | ICD-10-CM | POA: Diagnosis not present

## 2021-01-13 DIAGNOSIS — R600 Localized edema: Secondary | ICD-10-CM | POA: Diagnosis not present

## 2021-01-13 DIAGNOSIS — E785 Hyperlipidemia, unspecified: Secondary | ICD-10-CM | POA: Diagnosis not present

## 2021-01-13 DIAGNOSIS — Z8709 Personal history of other diseases of the respiratory system: Secondary | ICD-10-CM | POA: Diagnosis not present

## 2021-01-13 DIAGNOSIS — J309 Allergic rhinitis, unspecified: Secondary | ICD-10-CM | POA: Diagnosis not present

## 2021-01-13 DIAGNOSIS — K5909 Other constipation: Secondary | ICD-10-CM | POA: Diagnosis not present

## 2021-01-13 DIAGNOSIS — I10 Essential (primary) hypertension: Secondary | ICD-10-CM | POA: Diagnosis not present

## 2021-01-13 DIAGNOSIS — K219 Gastro-esophageal reflux disease without esophagitis: Secondary | ICD-10-CM | POA: Diagnosis not present

## 2021-01-13 DIAGNOSIS — Z6824 Body mass index (BMI) 24.0-24.9, adult: Secondary | ICD-10-CM | POA: Diagnosis not present

## 2021-01-21 ENCOUNTER — Other Ambulatory Visit: Payer: Self-pay | Admitting: Cardiology

## 2021-02-24 DIAGNOSIS — D649 Anemia, unspecified: Secondary | ICD-10-CM | POA: Diagnosis not present

## 2021-02-24 DIAGNOSIS — Z6824 Body mass index (BMI) 24.0-24.9, adult: Secondary | ICD-10-CM | POA: Diagnosis not present

## 2021-02-24 DIAGNOSIS — E538 Deficiency of other specified B group vitamins: Secondary | ICD-10-CM | POA: Diagnosis not present

## 2021-02-24 DIAGNOSIS — Z79899 Other long term (current) drug therapy: Secondary | ICD-10-CM | POA: Diagnosis not present

## 2021-02-24 DIAGNOSIS — R5383 Other fatigue: Secondary | ICD-10-CM | POA: Diagnosis not present

## 2021-02-24 DIAGNOSIS — Z6823 Body mass index (BMI) 23.0-23.9, adult: Secondary | ICD-10-CM | POA: Diagnosis not present

## 2021-02-24 DIAGNOSIS — Z8709 Personal history of other diseases of the respiratory system: Secondary | ICD-10-CM | POA: Diagnosis not present

## 2021-03-21 ENCOUNTER — Other Ambulatory Visit: Payer: Self-pay

## 2021-04-01 ENCOUNTER — Encounter (HOSPITAL_COMMUNITY): Payer: Self-pay | Admitting: Emergency Medicine

## 2021-04-01 ENCOUNTER — Ambulatory Visit: Payer: Medicare Other | Admitting: Cardiology

## 2021-04-01 ENCOUNTER — Emergency Department (HOSPITAL_COMMUNITY): Payer: Medicare Other

## 2021-04-01 ENCOUNTER — Inpatient Hospital Stay (HOSPITAL_COMMUNITY)
Admission: EM | Admit: 2021-04-01 | Discharge: 2021-04-06 | DRG: 308 | Disposition: A | Discharge status: Skilled Nursing Facility | Payer: Medicare Other | Attending: Student | Admitting: Student

## 2021-04-01 DIAGNOSIS — I110 Hypertensive heart disease with heart failure: Secondary | ICD-10-CM | POA: Diagnosis not present

## 2021-04-01 DIAGNOSIS — D649 Anemia, unspecified: Secondary | ICD-10-CM | POA: Diagnosis not present

## 2021-04-01 DIAGNOSIS — I4891 Unspecified atrial fibrillation: Principal | ICD-10-CM | POA: Diagnosis present

## 2021-04-01 DIAGNOSIS — J9601 Acute respiratory failure with hypoxia: Secondary | ICD-10-CM | POA: Diagnosis present

## 2021-04-01 DIAGNOSIS — I7143 Infrarenal abdominal aortic aneurysm, without rupture: Secondary | ICD-10-CM | POA: Diagnosis present

## 2021-04-01 DIAGNOSIS — Z9221 Personal history of antineoplastic chemotherapy: Secondary | ICD-10-CM

## 2021-04-01 DIAGNOSIS — D509 Iron deficiency anemia, unspecified: Secondary | ICD-10-CM | POA: Diagnosis present

## 2021-04-01 DIAGNOSIS — J9811 Atelectasis: Secondary | ICD-10-CM | POA: Diagnosis not present

## 2021-04-01 DIAGNOSIS — R0602 Shortness of breath: Secondary | ICD-10-CM | POA: Diagnosis not present

## 2021-04-01 DIAGNOSIS — J449 Chronic obstructive pulmonary disease, unspecified: Secondary | ICD-10-CM | POA: Diagnosis present

## 2021-04-01 DIAGNOSIS — Z8543 Personal history of malignant neoplasm of ovary: Secondary | ICD-10-CM

## 2021-04-01 DIAGNOSIS — I5031 Acute diastolic (congestive) heart failure: Secondary | ICD-10-CM | POA: Diagnosis not present

## 2021-04-01 DIAGNOSIS — I509 Heart failure, unspecified: Secondary | ICD-10-CM | POA: Diagnosis not present

## 2021-04-01 DIAGNOSIS — Z888 Allergy status to other drugs, medicaments and biological substances status: Secondary | ICD-10-CM

## 2021-04-01 DIAGNOSIS — R0902 Hypoxemia: Secondary | ICD-10-CM | POA: Diagnosis not present

## 2021-04-01 DIAGNOSIS — I714 Abdominal aortic aneurysm, without rupture, unspecified: Secondary | ICD-10-CM

## 2021-04-01 DIAGNOSIS — Z825 Family history of asthma and other chronic lower respiratory diseases: Secondary | ICD-10-CM

## 2021-04-01 DIAGNOSIS — J90 Pleural effusion, not elsewhere classified: Secondary | ICD-10-CM | POA: Diagnosis not present

## 2021-04-01 DIAGNOSIS — R062 Wheezing: Secondary | ICD-10-CM | POA: Diagnosis not present

## 2021-04-01 DIAGNOSIS — Z79899 Other long term (current) drug therapy: Secondary | ICD-10-CM

## 2021-04-01 DIAGNOSIS — M1990 Unspecified osteoarthritis, unspecified site: Secondary | ICD-10-CM | POA: Diagnosis present

## 2021-04-01 DIAGNOSIS — Z66 Do not resuscitate: Secondary | ICD-10-CM | POA: Diagnosis present

## 2021-04-01 DIAGNOSIS — E785 Hyperlipidemia, unspecified: Secondary | ICD-10-CM | POA: Diagnosis present

## 2021-04-01 DIAGNOSIS — Z20822 Contact with and (suspected) exposure to covid-19: Secondary | ICD-10-CM | POA: Diagnosis present

## 2021-04-01 DIAGNOSIS — K5900 Constipation, unspecified: Secondary | ICD-10-CM | POA: Diagnosis present

## 2021-04-01 DIAGNOSIS — R011 Cardiac murmur, unspecified: Secondary | ICD-10-CM | POA: Diagnosis present

## 2021-04-01 DIAGNOSIS — K219 Gastro-esophageal reflux disease without esophagitis: Secondary | ICD-10-CM | POA: Diagnosis present

## 2021-04-01 DIAGNOSIS — Z9114 Patient's other noncompliance with medication regimen: Secondary | ICD-10-CM

## 2021-04-01 DIAGNOSIS — Z8249 Family history of ischemic heart disease and other diseases of the circulatory system: Secondary | ICD-10-CM

## 2021-04-01 DIAGNOSIS — J441 Chronic obstructive pulmonary disease with (acute) exacerbation: Secondary | ICD-10-CM

## 2021-04-01 LAB — CBC WITH DIFFERENTIAL/PLATELET
Abs Immature Granulocytes: 0.07 10*3/uL (ref 0.00–0.07)
Basophils Absolute: 0 10*3/uL (ref 0.0–0.1)
Basophils Relative: 0 %
Eosinophils Absolute: 0.1 10*3/uL (ref 0.0–0.5)
Eosinophils Relative: 1 %
HCT: 32.8 % — ABNORMAL LOW (ref 36.0–46.0)
Hemoglobin: 9.4 g/dL — ABNORMAL LOW (ref 12.0–15.0)
Immature Granulocytes: 1 %
Lymphocytes Relative: 6 %
Lymphs Abs: 0.7 10*3/uL (ref 0.7–4.0)
MCH: 24.2 pg — ABNORMAL LOW (ref 26.0–34.0)
MCHC: 28.7 g/dL — ABNORMAL LOW (ref 30.0–36.0)
MCV: 84.3 fL (ref 80.0–100.0)
Monocytes Absolute: 0.8 10*3/uL (ref 0.1–1.0)
Monocytes Relative: 7 %
Neutro Abs: 9.3 10*3/uL — ABNORMAL HIGH (ref 1.7–7.7)
Neutrophils Relative %: 85 %
Platelets: 249 10*3/uL (ref 150–400)
RBC: 3.89 MIL/uL (ref 3.87–5.11)
RDW: 19.2 % — ABNORMAL HIGH (ref 11.5–15.5)
WBC: 11 10*3/uL — ABNORMAL HIGH (ref 4.0–10.5)
nRBC: 0 % (ref 0.0–0.2)

## 2021-04-01 LAB — COMPREHENSIVE METABOLIC PANEL
ALT: 19 U/L (ref 0–44)
AST: 15 U/L (ref 15–41)
Albumin: 3.4 g/dL — ABNORMAL LOW (ref 3.5–5.0)
Alkaline Phosphatase: 78 U/L (ref 38–126)
Anion gap: 10 (ref 5–15)
BUN: 15 mg/dL (ref 8–23)
CO2: 23 mmol/L (ref 22–32)
Calcium: 9.1 mg/dL (ref 8.9–10.3)
Chloride: 105 mmol/L (ref 98–111)
Creatinine, Ser: 0.92 mg/dL (ref 0.44–1.00)
GFR, Estimated: 59 mL/min — ABNORMAL LOW (ref >60)
Glucose, Bld: 103 mg/dL — ABNORMAL HIGH (ref 70–99)
Potassium: 4.4 mmol/L (ref 3.5–5.1)
Sodium: 138 mmol/L (ref 135–145)
Total Bilirubin: 1.3 mg/dL — ABNORMAL HIGH (ref 0.3–1.2)
Total Protein: 6.1 g/dL — ABNORMAL LOW (ref 6.5–8.1)

## 2021-04-01 LAB — RESP PANEL BY RT-PCR (FLU A&B, COVID) ARPGX2
Influenza A by PCR: NEGATIVE
Influenza B by PCR: NEGATIVE
SARS Coronavirus 2 by RT PCR: NEGATIVE

## 2021-04-01 LAB — TROPONIN I (HIGH SENSITIVITY)
Troponin I (High Sensitivity): 14 ng/L (ref <18)
Troponin I (High Sensitivity): 14 ng/L (ref <18)

## 2021-04-01 LAB — DIGOXIN LEVEL: Digoxin Level: <0.2 ng/mL — ABNORMAL LOW (ref 0.8–2.0)

## 2021-04-01 LAB — BRAIN NATRIURETIC PEPTIDE: B Natriuretic Peptide: 243.2 pg/mL — ABNORMAL HIGH (ref 0.0–100.0)

## 2021-04-01 LAB — CBG MONITORING, ED: Glucose-Capillary: 103 mg/dL — ABNORMAL HIGH (ref 70–99)

## 2021-04-01 MED ORDER — ALBUTEROL SULFATE (2.5 MG/3ML) 0.083% IN NEBU: 3.0000 mL | INHALATION_SOLUTION | Freq: Once | RESPIRATORY_TRACT | Status: DC

## 2021-04-01 MED ORDER — DILTIAZEM HCL-DEXTROSE 125-5 MG/125ML-% IV SOLN (PREMIX)
5.0000 mg/h | INTRAVENOUS | Status: DC
  Administered 2021-04-01: 5 mg/h via INTRAVENOUS
  Administered 2021-04-02 (×2): 10 mg/h via INTRAVENOUS
  Administered 2021-04-03: 5 mg/h via INTRAVENOUS
  Administered 2021-04-03: 10 mg/h via INTRAVENOUS
  Filled 2021-04-01 (×7): qty 125

## 2021-04-01 MED ORDER — DILTIAZEM LOAD VIA INFUSION
10.0000 mg | Freq: Once | INTRAVENOUS | Status: AC
  Administered 2021-04-01: 10 mg via INTRAVENOUS
  Filled 2021-04-01: qty 10

## 2021-04-01 MED ORDER — LEVALBUTEROL HCL 1.25 MG/0.5ML IN NEBU
1.2500 mg | INHALATION_SOLUTION | Freq: Once | RESPIRATORY_TRACT | Status: AC
  Administered 2021-04-01: 1.25 mg via RESPIRATORY_TRACT
  Filled 2021-04-01: qty 0.5

## 2021-04-01 MED ORDER — FUROSEMIDE 10 MG/ML IJ SOLN
20.0000 mg | Freq: Once | INTRAMUSCULAR | Status: AC
  Administered 2021-04-01: 20 mg via INTRAVENOUS
  Filled 2021-04-01: qty 2

## 2021-04-01 NOTE — ED Provider Notes (Signed)
Emergency Medicine Provider Triage Evaluation Note  Adriana Castro , a 87 y.o. female  was evaluated in triage.  Pt complains of gradual worsening for three weeks of worsening shortness of breath. She is not on oxygen at home.  She was taken off her xarelto by Dr. Rica Records (PCP) about 2-3 weeks ago.  She does take xarelto.  She reports that she has been missing medications because she has felt sick.   Review of Systems  Positive: Shortness of breath Negative: Vomiting.   Physical Exam  BP 115/73 (BP Location: Right Arm)   Pulse (!) 55   Temp (!) 97.4 F (36.3 C) (Oral)   Resp 20   SpO2 100%  Gen:   Awake, no distress   Resp:  Normal effort, on oxygen MSK:   Moves extremities without difficulty  Other:  Normal speech, no obvious dyspnea.   Medical Decision Making  Medically screening exam initiated at 12:16 PM.  Appropriate orders placed.  Adriana Castro was informed that the remainder of the evaluation will be completed by another provider, this initial triage assessment does not replace that evaluation, and the importance of remaining in the ED until their evaluation is complete.     Lorin Glass, PA-C 04/01/21 1221    Regan Lemming, MD 04/01/21 1248

## 2021-04-01 NOTE — ED Provider Notes (Signed)
[facility] EMERGENCY DEPARTMENT Provider Note   CSN: [id number] Arrival date & time: 04/01/21  1127     History Chief Complaint  Patient presents with   Shortness of Breath    Adriana Castro is a 87 y.o. female with a past medical history of A. fib, hypertension, COPD presenting to the ED with a chief complaint of shortness of breath, generalized weakness.  Symptoms have been gradually worsening over the past 3 weeks.  She was taken off of her Xarelto about 1-1/2 weeks ago by her PCP.  She admits that she has not been fully compliant with any of her home medications since she started feeling ill.  She denies any vomiting but does report concerns for constipation.  She tried to drink prune juice but sister concerned that she had not been doing this consistently which is why she is still constipated.  She denies chest pain, fever or cough.  She had been using her home inhalers possibly but I am unsure as she lives by herself and does appear somewhat confused.    Shortness of Breath Associated symptoms: fever and wheezing   Associated symptoms: no abdominal pain, no chest pain, no cough, no ear pain, no rash, no sore throat and no vomiting       Past Medical History:  Diagnosis Date   Abdominal aortic aneurysm without rupture 01/06/2016   Adrenal adenoma 09/10/2013   Overview:  Left, unchanged per CT 09/10/2013  Formatting of this note might be different from the original. Left, unchanged per CT 09/10/2013   Aneurysm ([place]) 09/10/2013   Formatting of this note might be different from the original. Infrarenal aortic aneurysm 4.7 cm in greatest anteroposterior dimensions per CT ( 09/10/2013 )   Anxiety    Anxiety    Arthritis    Atrial fibrillation ([place]) 09/10/2013   Formatting of this note might be different from the original. f/b Dr. Geraldo Pitter ( [place], [place] ) Xarelto 15mg  daily (decreased from 20mg  daily 4/25) Diltiazem 120 BID Amioderone 200mg  daily added 4/25   Bilateral  carotid artery stenosis 01/06/2016   Chest pain 02/05/2013   Closed bicondylar fracture of distal end of left humerus    COPD (chronic obstructive pulmonary disease) ([place])    Depression 09/10/2013   Dyslipidemia    Dysrhythmia    ATRIAL FIBRILATION   Encounter for antineoplastic chemotherapy 11/09/2013   GERD (gastroesophageal reflux disease)    Heart murmur    History of open reduction and internal fixation (ORIF) procedure 11/01/2016   HTN (hypertension) 09/10/2013   Hyperlipidemia 09/10/2013   Hypertension    Hypoxia 02/05/2013   Irregular heart beat    Leaky heart valve    Malignant neoplasm of ovary ([place]) 09/28/2013   Overview:  S/p 4 cycles neoadjuvant chemotherapy 09/23/12 exploratory laparotomy, TAH/BSO, omentectomy   Malignant ovarian neoplasm ([place]) 09/28/2013   Formatting of this note might be different from the original. S/p 4 cycles neoadjuvant chemotherapy 09/23/12 exploratory laparotomy, TAH/BSO, omentectomy   Mood disorder ([place])    Ovarian cancer ([place])    Shortness of breath     Patient Active Problem List   Diagnosis Date Noted   Atrial fibrillation with rapid ventricular response ([place]) 04/01/2021   Arthritis    Dyslipidemia    Dysrhythmia    Heart murmur    Hypertension    Irregular heart beat    Mood disorder (HCC)    Ovarian cancer ([place])    Shortness of breath    History  of open reduction and internal fixation (ORIF) procedure 11/01/2016   Closed bicondylar fracture of distal end of left humerus    Abdominal aortic aneurysm without rupture 01/06/2016   Bilateral carotid artery stenosis 01/06/2016   Encounter for antineoplastic chemotherapy 11/09/2013   Malignant neoplasm of ovary ([place]) 09/28/2013   Malignant ovarian neoplasm ([place]) 09/28/2013   HTN (hypertension) 09/10/2013   Hyperlipidemia 09/10/2013   Adrenal adenoma 09/10/2013   Anxiety 09/10/2013   Depression 09/10/2013   GERD (gastroesophageal reflux disease) 09/10/2013   Atrial fibrillation ([place]) 09/10/2013    Aneurysm ([place]) 09/10/2013   COPD (chronic obstructive pulmonary disease) ([place]) 02/05/2013   Hypoxia 02/05/2013   Chest pain 02/05/2013   Leaky heart valve     Past Surgical History:  Procedure Laterality Date   BREAST BIOPSY     CATARACT EXTRACTION, BILATERAL     ORIF HUMERUS FRACTURE Left 11/01/2016   Procedure: OPEN REDUCTION INTERNAL FIXATION (ORIF) LEFT SUPRACONDYLAR HUMERUS AND MEDIAL EPICONDYLE;  Surgeon: Leandrew Koyanagi, MD;  Location: [place];  Service: Orthopedics;  Laterality: Left;   SHOULDER SURGERY Right    ?   2002      SKIN CANCER EXCISION     X 5   TONSILLECTOMY       OB History   No obstetric history on file.     Family History  Problem Relation Age of Onset   Diabetes Mother    Heart attack Mother    Heart disease Mother    Hypertension Mother    Asthma Father    Stroke Father    Breast cancer Sister    Prostate cancer Brother    Diabetes Brother    Stroke Brother     Social History   Tobacco Use   Smoking status: Former    Types: Cigarettes    Quit date: 06/07/2012    Years since quitting: 8.8   Smokeless tobacco: Never  Vaping Use   Vaping Use: Never used  Substance Use Topics   Alcohol use: No   Drug use: No    Home Medications Prior to Admission medications   Medication Sig Start Date End Date Taking? Authorizing Provider  digoxin (LANOXIN) 0.125 MG tablet TAKE 1 TABLET (0.125 MG TOTAL) BY MOUTH EVERY MONDAY, WEDNESDAY, AND FRIDAY. Patient taking differently: Take 0.125 mg by mouth every Monday, Wednesday, and Friday. 01/21/21  Yes Revankar, Reita Cliche, MD  diltiazem (CARDIZEM CD) 120 MG 24 hr capsule Take 120 mg by mouth 2 (two) times daily. 01/21/20  Yes [provider]  fluticasone (FLONASE) 50 MCG/ACT nasal spray Place 1 spray into both nostrils daily as needed for allergies. 11/22/15  Yes [provider]  furosemide (LASIX) 20 MG tablet Take 20 mg by mouth daily as needed for fluid. 09/30/19  Yes [provider]   INCRUSE ELLIPTA 62.5 MCG/INH AEPB Inhale 2 puffs into the lungs daily. 08/10/20  Yes [provider]  ipratropium (ATROVENT) 0.02 % nebulizer solution Inhale 0.25 mg into the lungs 4 (four) times daily as needed for wheezing or shortness of breath. 03/30/21  Yes [provider]  montelukast (SINGULAIR) 10 MG tablet Take 10 mg by mouth every evening. 10/09/16  Yes [provider]  nitroGLYCERIN (NITROSTAT) 0.4 MG SL tablet PLACE 1 TABLET UNDER TONGUE EVERY 5 MINS, UP TO 3 DOSES AS NEEDED FOR CHEST PAIN Patient taking differently: Place 0.4 mg under the tongue every 5 (five) minutes as needed for chest pain. 12/08/20  Yes Revankar, Reita Cliche,  MD  omeprazole (PRILOSEC) 20 MG capsule Take 20 mg by mouth daily as needed for indigestion.   Yes [provider]  Vitamin D, Ergocalciferol, (DRISDOL) 1.25 MG (50000 UNIT) CAPS capsule Take 50,000 Units by mouth every 7 (seven) days. No set day of the week   Yes [provider]  vitamin E 400 UNIT capsule Take 800 Units by mouth daily.   Yes [provider]  XOPENEX HFA 45 MCG/ACT inhaler Inhale 2 puffs into the lungs in the morning, at noon, in the evening, and at bedtime. 08/05/20  Yes [provider]  atorvastatin (LIPITOR) 10 MG tablet Take 5 mg by mouth daily. Patient not taking: No sig reported 10/21/20   [provider]  azithromycin (ZITHROMAX) 250 MG tablet Take 250 mg by mouth See admin instructions. 2 tabs x 1 day 1 tab x 4 days Patient not taking: Reported on 04/01/2021 03/22/21   [provider]  predniSONE (STERAPRED UNI-PAK 21 TAB) 10 MG (21) TBPK tablet Take 10 mg by mouth See admin instructions. 6,5,4,3,2,1 Patient not taking: Reported on 04/01/2021 03/22/21   [provider]  Rivaroxaban (XARELTO) 15 MG TABS tablet Take 1 tablet (15 mg total) by mouth daily with supper. Patient not taking: No sig reported 09/09/20   Revankar, Reita Cliche, MD    Allergies     Albuterol  Review of Systems   Review of Systems  Constitutional:  Positive for fever. Negative for appetite change and chills.  HENT:  Negative for ear pain, rhinorrhea, sneezing and sore throat.   Eyes:  Negative for photophobia and visual disturbance.  Respiratory:  Positive for shortness of breath and wheezing. Negative for cough and chest tightness.   Cardiovascular:  Negative for chest pain and palpitations.  Gastrointestinal:  Negative for abdominal pain, blood in stool, constipation, diarrhea, nausea and vomiting.  Genitourinary:  Negative for dysuria, hematuria and urgency.  Musculoskeletal:  Negative for myalgias.  Skin:  Negative for rash.  Neurological:  Negative for dizziness, weakness and light-headedness.   Physical Exam Updated Vital Signs BP (!) 148/98   Pulse (!) 105   Temp 98.2 F (36.8 C) (Oral)   Resp (!) 24   Ht 5\' 7"  (1.702 m)   Wt 64 kg   SpO2 97%   BMI 22.08 kg/m   Physical Exam Vitals and nursing note reviewed.  Constitutional:      General: She is not in acute distress.    Appearance: She is well-developed.  HENT:     Head: Normocephalic and atraumatic.     Nose: Nose normal.  Eyes:     General: No scleral icterus.       Left eye: No discharge.     Conjunctiva/sclera: Conjunctivae normal.  Cardiovascular:     Rate and Rhythm: Regular rhythm. Tachycardia present.     Heart sounds: Normal heart sounds. No murmur heard.   No friction rub. No gallop.  Pulmonary:     Effort: Pulmonary effort is normal. Tachypnea present. No respiratory distress.     Breath sounds: Normal breath sounds.  Abdominal:     General: Bowel sounds are normal. There is no distension.     Palpations: Abdomen is soft.     Tenderness: There is no abdominal tenderness. There is no guarding.  Musculoskeletal:        General: Normal range of motion.     Cervical back: Normal range of motion and neck supple.  Skin:    General: Skin is  warm and dry.     Findings: No  rash.  Neurological:     Mental Status: She is alert.     Motor: No abnormal muscle tone.     Coordination: Coordination normal.    ED Results / Procedures / Treatments   Labs (all labs ordered are listed, but only abnormal results are displayed) Labs Reviewed  COMPREHENSIVE METABOLIC PANEL - Abnormal; Notable for the following components:      Result Value   Glucose, Bld 103 (*)    Total Protein 6.1 (*)    Albumin 3.4 (*)    Total Bilirubin 1.3 (*)    GFR, Estimated 59 (*)    All other components within normal limits  BRAIN NATRIURETIC PEPTIDE - Abnormal; Notable for the following components:   B Natriuretic Peptide 243.2 (*)    All other components within normal limits  CBC WITH DIFFERENTIAL/PLATELET - Abnormal; Notable for the following components:   WBC 11.0 (*)    Hemoglobin 9.4 (*)    HCT 32.8 (*)    MCH 24.2 (*)    MCHC 28.7 (*)    RDW 19.2 (*)    Neutro Abs 9.3 (*)    All other components within normal limits  DIGOXIN LEVEL - Abnormal; Notable for the following components:   Digoxin Level <0.2 (*)    All other components within normal limits  RESP PANEL BY RT-PCR (FLU A&B, COVID) ARPGX2  URINALYSIS, ROUTINE W REFLEX MICROSCOPIC  TROPONIN I (HIGH SENSITIVITY)  TROPONIN I (HIGH SENSITIVITY)    EKG EKG Interpretation  Date/Time:  Friday April 01 2021 19:21:27 EDT Ventricular Rate:  128 PR Interval:    QRS Duration: 98 QT Interval:  311 QTC Calculation: 454 R Axis:   -23 Text Interpretation: Atrial fibrillation Borderline left axis deviation Anteroseptal infarct, age indeterminate Confirmed by Davonna Belling [id number]) on 04/01/2021 7:28:40 PM  Radiology DG Chest 2 View  Result Date: 04/01/2021 CLINICAL DATA:  Shortness of breath EXAM: CHEST - 2 VIEW COMPARISON:  Chest x-ray dated February 26, 2020 FINDINGS: Cardiac and mediastinal contours are partially obscured but appeared within normal limits. Small left greater than right pleural effusions with  associated atelectasis. No focal consolidation. Diffuse bilateral interstitial opacities. No evidence of pneumothorax. IMPRESSION: Bilateral interstitial opacities and small bilateral pleural effusions, likely due to pulmonary edema. Electronically Signed   By: Yetta Glassman M.D.   On: 04/01/2021 13:10   DG Abdomen 1 View  Result Date: 04/01/2021 CLINICAL DATA:  Constipation. EXAM: ABDOMEN - 1 VIEW COMPARISON:  Abdominopelvic CT 02/25/2018, CTA 03/19/2019 FINDINGS: Small volume of stool throughout the colon, no increased stool burden to suggest constipation. No bowel dilatation or evidence of obstruction. No free intra-abdominal air. Advanced aortic atherosclerosis. Infrarenal aortic aneurysm with aortic calcifications spanning 6.8 cm. Aneurysm measured 6.5 cm when measured on prior scout radiograph. The bones are diffusely under mineralized. IMPRESSION: 1. Small volume of colonic stool, no increased stool burden to suggest constipation. No bowel obstruction. 2. Aortic atherosclerosis. Infrarenal aortic aneurysm with aortic calcifications spanning 6.8 cm. Aneurysm measured 6.5 cm on prior scout radiograph, and 5.8 cm when measured on prior CTA. Recommend follow-up assessment with CTA, acuity based on patient's symptoms. Electronically Signed   By: Keith Rake M.D.   On: 04/01/2021 22:00    Procedures .Critical Care Performed by: Delia Heady, PA-C Authorized by: Delia Heady, PA-C   Critical care provider statement:    Critical care time (minutes):  45   Critical care time was  exclusive of:  Separately billable procedures and treating other patients and teaching time   Critical care was necessary to treat or prevent imminent or life-threatening deterioration of the following conditions:  Cardiac failure, respiratory failure and circulatory failure   Critical care was time spent personally by me on the following activities:  Development of treatment plan with patient or surrogate, discussions  with consultants, evaluation of patient's response to treatment, examination of patient, obtaining history from patient or surrogate, ordering and performing treatments and interventions, ordering and review of laboratory studies, ordering and review of radiographic studies, pulse oximetry, re-evaluation of patient's condition and review of old charts   I assumed direction of critical care for this patient from another provider in my specialty: no     Care discussed with: admitting provider     Medications Ordered in ED Medications  diltiazem (CARDIZEM) 1 mg/mL load via infusion 10 mg (10 mg Intravenous Bolus from Bag 04/01/21 2038)    And  diltiazem (CARDIZEM) 125 mg in dextrose 5% 125 mL (1 mg/mL) infusion (10 mg/hr Intravenous Infusion Verify 04/01/21 2222)  furosemide (LASIX) injection 20 mg (has no administration in time range)  levalbuterol (XOPENEX) nebulizer solution 1.25 mg (has no administration in time range)    ED Course  I have reviewed the triage vital signs and the nursing notes.  Pertinent labs & imaging results that were available during my care of the patient were reviewed by me and considered in my medical decision making (see chart for details).  Clinical Course as of 04/01/21 2253  Fri Apr 01, 2021  1931 Digoxin, Serum(!): <0.2 [HK]  1931 Hemoglobin(!): 9.4 [HK]  1931 Creatinine: 0.92 [HK]  2205 Troponin I (High Sensitivity): 14 [HK]    Clinical Course User Index [HK] Delia Heady, PA-C   MDM Rules/Calculators/A&P                           87 year old female with past medical history of A. fib, hypertension, COPD presenting to the ED with a chief complaint of shortness of breath, generalized weakness.  Gradually worsening symptoms over the past 3 weeks.  She was taken off of her Xarelto a week and a half ago by her PCP.  She admits that she has not been fully compliant with any of her medications since she started feeling ill.  Reports concerns for constipation.   No chest pain, fever.  On exam patient with rhonchi and wheezing in bilateral lung fields.  She is tachycardic with irregular heart rate.  She is tachypneic.  She is on 3 days of oxygen by nasal cannula.  She has some bilateral lower extremity edema noted.  Her abdomen is soft and nontender.  Blood work done in triage shows a low dig level.  Her hemoglobin is 9.4.  Slightly elevated BNP to 200s.  Her EKG shows A. fib with rates in the 120s.  She was given diltiazem load and infusion.  She remains in A. fib but her rates have improved.  I will also give her Lasix to help with diuresis as I feel that some of her respiratory symptoms could be from her pleural effusions and noncompliance with her Lasix.  She will be given a breathing treatment with Xopenex.  I had discussion with patient, sister at the bedside.  I feel that she will benefit from admission for ongoing management of her hypoxia in the setting of fluid overload, A. fib with RVR.  Patient  is agreeable to the plan.    Portions of this note were generated with [employer]. Dictation errors may occur despite best attempts at proofreading.  Final Clinical Impression(s) / ED Diagnoses Final diagnoses:  Atrial fibrillation, unspecified type ([place])  COPD exacerbation ([place])  Acute on chronic heart failure, unspecified heart failure type [facility])    Rx / DC Orders ED Discharge Orders     None        Delia Heady, PA-C 04/01/21 2254    Davonna Belling, MD 04/01/21 2355

## 2021-04-01 NOTE — ED Triage Notes (Addendum)
Patient [facility] for evaluation of shortness of breath x3 weeks since getting her COVID booster. EMS reports expiratory wheezing, patient is allergic to albuterol. Patient alert, oriented, and in no apparent distress at this time.  EMS vitals 130/80 95% on 2L o2 [place], 85% on room air HR 80-105 with afib (hx of same)

## 2021-04-02 ENCOUNTER — Inpatient Hospital Stay (HOSPITAL_COMMUNITY): Payer: Medicare Other

## 2021-04-02 ENCOUNTER — Encounter (HOSPITAL_COMMUNITY): Payer: Self-pay | Admitting: Internal Medicine

## 2021-04-02 DIAGNOSIS — D509 Iron deficiency anemia, unspecified: Secondary | ICD-10-CM | POA: Diagnosis present

## 2021-04-02 DIAGNOSIS — F32A Depression, unspecified: Secondary | ICD-10-CM | POA: Diagnosis not present

## 2021-04-02 DIAGNOSIS — Z66 Do not resuscitate: Secondary | ICD-10-CM | POA: Diagnosis present

## 2021-04-02 DIAGNOSIS — Z7189 Other specified counseling: Secondary | ICD-10-CM

## 2021-04-02 DIAGNOSIS — I714 Abdominal aortic aneurysm, without rupture, unspecified: Secondary | ICD-10-CM

## 2021-04-02 DIAGNOSIS — J9601 Acute respiratory failure with hypoxia: Secondary | ICD-10-CM | POA: Diagnosis present

## 2021-04-02 DIAGNOSIS — R079 Chest pain, unspecified: Secondary | ICD-10-CM | POA: Diagnosis not present

## 2021-04-02 DIAGNOSIS — K5900 Constipation, unspecified: Secondary | ICD-10-CM | POA: Diagnosis present

## 2021-04-02 DIAGNOSIS — I110 Hypertensive heart disease with heart failure: Secondary | ICD-10-CM | POA: Diagnosis present

## 2021-04-02 DIAGNOSIS — D500 Iron deficiency anemia secondary to blood loss (chronic): Secondary | ICD-10-CM | POA: Diagnosis not present

## 2021-04-02 DIAGNOSIS — Z9114 Patient's other noncompliance with medication regimen: Secondary | ICD-10-CM | POA: Diagnosis not present

## 2021-04-02 DIAGNOSIS — Z743 Need for continuous supervision: Secondary | ICD-10-CM | POA: Diagnosis not present

## 2021-04-02 DIAGNOSIS — C569 Malignant neoplasm of unspecified ovary: Secondary | ICD-10-CM | POA: Diagnosis not present

## 2021-04-02 DIAGNOSIS — Z79899 Other long term (current) drug therapy: Secondary | ICD-10-CM | POA: Diagnosis not present

## 2021-04-02 DIAGNOSIS — J441 Chronic obstructive pulmonary disease with (acute) exacerbation: Secondary | ICD-10-CM | POA: Diagnosis not present

## 2021-04-02 DIAGNOSIS — I719 Aortic aneurysm of unspecified site, without rupture: Secondary | ICD-10-CM | POA: Diagnosis not present

## 2021-04-02 DIAGNOSIS — J9602 Acute respiratory failure with hypercapnia: Secondary | ICD-10-CM | POA: Diagnosis not present

## 2021-04-02 DIAGNOSIS — I4891 Unspecified atrial fibrillation: Principal | ICD-10-CM

## 2021-04-02 DIAGNOSIS — I7143 Infrarenal abdominal aortic aneurysm, without rupture: Secondary | ICD-10-CM | POA: Diagnosis present

## 2021-04-02 DIAGNOSIS — R5381 Other malaise: Secondary | ICD-10-CM | POA: Diagnosis not present

## 2021-04-02 DIAGNOSIS — Z20822 Contact with and (suspected) exposure to covid-19: Secondary | ICD-10-CM | POA: Diagnosis present

## 2021-04-02 DIAGNOSIS — Z8249 Family history of ischemic heart disease and other diseases of the circulatory system: Secondary | ICD-10-CM | POA: Diagnosis not present

## 2021-04-02 DIAGNOSIS — I5033 Acute on chronic diastolic (congestive) heart failure: Secondary | ICD-10-CM | POA: Diagnosis not present

## 2021-04-02 DIAGNOSIS — Z8781 Personal history of (healed) traumatic fracture: Secondary | ICD-10-CM | POA: Diagnosis not present

## 2021-04-02 DIAGNOSIS — Z9221 Personal history of antineoplastic chemotherapy: Secondary | ICD-10-CM | POA: Diagnosis not present

## 2021-04-02 DIAGNOSIS — R2681 Unsteadiness on feet: Secondary | ICD-10-CM | POA: Diagnosis not present

## 2021-04-02 DIAGNOSIS — J449 Chronic obstructive pulmonary disease, unspecified: Secondary | ICD-10-CM | POA: Diagnosis present

## 2021-04-02 DIAGNOSIS — K219 Gastro-esophageal reflux disease without esophagitis: Secondary | ICD-10-CM | POA: Diagnosis present

## 2021-04-02 DIAGNOSIS — I509 Heart failure, unspecified: Secondary | ICD-10-CM | POA: Diagnosis not present

## 2021-04-02 DIAGNOSIS — R011 Cardiac murmur, unspecified: Secondary | ICD-10-CM | POA: Diagnosis present

## 2021-04-02 DIAGNOSIS — J90 Pleural effusion, not elsewhere classified: Secondary | ICD-10-CM | POA: Diagnosis not present

## 2021-04-02 DIAGNOSIS — R0602 Shortness of breath: Secondary | ICD-10-CM | POA: Diagnosis present

## 2021-04-02 DIAGNOSIS — I5031 Acute diastolic (congestive) heart failure: Secondary | ICD-10-CM

## 2021-04-02 DIAGNOSIS — R531 Weakness: Secondary | ICD-10-CM | POA: Diagnosis not present

## 2021-04-02 DIAGNOSIS — M1990 Unspecified osteoarthritis, unspecified site: Secondary | ICD-10-CM | POA: Diagnosis present

## 2021-04-02 DIAGNOSIS — T7840XD Allergy, unspecified, subsequent encounter: Secondary | ICD-10-CM | POA: Diagnosis not present

## 2021-04-02 DIAGNOSIS — R5383 Other fatigue: Secondary | ICD-10-CM | POA: Diagnosis not present

## 2021-04-02 DIAGNOSIS — I10 Essential (primary) hypertension: Secondary | ICD-10-CM | POA: Diagnosis not present

## 2021-04-02 DIAGNOSIS — F419 Anxiety disorder, unspecified: Secondary | ICD-10-CM | POA: Diagnosis not present

## 2021-04-02 DIAGNOSIS — Z825 Family history of asthma and other chronic lower respiratory diseases: Secondary | ICD-10-CM | POA: Diagnosis not present

## 2021-04-02 DIAGNOSIS — E785 Hyperlipidemia, unspecified: Secondary | ICD-10-CM | POA: Diagnosis present

## 2021-04-02 DIAGNOSIS — Z8543 Personal history of malignant neoplasm of ovary: Secondary | ICD-10-CM | POA: Diagnosis not present

## 2021-04-02 DIAGNOSIS — Z7901 Long term (current) use of anticoagulants: Secondary | ICD-10-CM | POA: Diagnosis not present

## 2021-04-02 DIAGNOSIS — Z888 Allergy status to other drugs, medicaments and biological substances status: Secondary | ICD-10-CM | POA: Diagnosis not present

## 2021-04-02 DIAGNOSIS — J9811 Atelectasis: Secondary | ICD-10-CM | POA: Diagnosis present

## 2021-04-02 LAB — BASIC METABOLIC PANEL
Anion gap: 10 (ref 5–15)
BUN: 18 mg/dL (ref 8–23)
CO2: 27 mmol/L (ref 22–32)
Calcium: 8.8 mg/dL — ABNORMAL LOW (ref 8.9–10.3)
Chloride: 102 mmol/L (ref 98–111)
Creatinine, Ser: 1.07 mg/dL — ABNORMAL HIGH (ref 0.44–1.00)
GFR, Estimated: 49 mL/min — ABNORMAL LOW (ref >60)
Glucose, Bld: 112 mg/dL — ABNORMAL HIGH (ref 70–99)
Potassium: 4 mmol/L (ref 3.5–5.1)
Sodium: 139 mmol/L (ref 135–145)

## 2021-04-02 LAB — HEPARIN LEVEL (UNFRACTIONATED)
Heparin Unfractionated: 0.33 IU/mL (ref 0.30–0.70)
Heparin Unfractionated: 0.41 IU/mL (ref 0.30–0.70)

## 2021-04-02 LAB — CBC
HCT: 29.3 % — ABNORMAL LOW (ref 36.0–46.0)
HCT: 29.4 % — ABNORMAL LOW (ref 36.0–46.0)
Hemoglobin: 8.3 g/dL — ABNORMAL LOW (ref 12.0–15.0)
Hemoglobin: 8.3 g/dL — ABNORMAL LOW (ref 12.0–15.0)
MCH: 23.6 pg — ABNORMAL LOW (ref 26.0–34.0)
MCH: 23.9 pg — ABNORMAL LOW (ref 26.0–34.0)
MCHC: 28.3 g/dL — ABNORMAL LOW (ref 30.0–36.0)
MCHC: 28.2 g/dL — ABNORMAL LOW (ref 30.0–36.0)
MCV: 83.2 fL (ref 80.0–100.0)
MCV: 84.5 fL (ref 80.0–100.0)
Platelets: 240 10*3/uL (ref 150–400)
Platelets: 214 10*3/uL (ref 150–400)
RBC: 3.52 MIL/uL — ABNORMAL LOW (ref 3.87–5.11)
RBC: 3.48 MIL/uL — ABNORMAL LOW (ref 3.87–5.11)
RDW: 19.1 % — ABNORMAL HIGH (ref 11.5–15.5)
RDW: 19.3 % — ABNORMAL HIGH (ref 11.5–15.5)
WBC: 10.5 10*3/uL (ref 4.0–10.5)
WBC: 9.1 10*3/uL (ref 4.0–10.5)
nRBC: 0 % (ref 0.0–0.2)
nRBC: 0 % (ref 0.0–0.2)

## 2021-04-02 LAB — IRON AND TIBC
Iron: 33 ug/dL (ref 28–170)
Saturation Ratios: 10 % — ABNORMAL LOW (ref 10.4–31.8)
TIBC: 319 ug/dL (ref 250–450)
UIBC: 286 ug/dL

## 2021-04-02 LAB — ECHOCARDIOGRAM COMPLETE
AR max vel: 2.48 cm2
AV Area VTI: 2.38 cm2
AV Area mean vel: 2.24 cm2
AV Mean grad: 6 mmHg
AV Peak grad: 14.6 mmHg
Ao pk vel: 1.91 m/s
Area-P 1/2: 3.68 cm2
Height: 67 in
MV VTI: 2.25 cm2
S' Lateral: 2.5 cm
Weight: 2127 oz

## 2021-04-02 LAB — FERRITIN: Ferritin: 30 ng/mL (ref 11–307)

## 2021-04-02 LAB — RETICULOCYTES
Immature Retic Fract: 28.5 % — ABNORMAL HIGH (ref 2.3–15.9)
RBC.: 3.44 MIL/uL — ABNORMAL LOW (ref 3.87–5.11)
Retic Count, Absolute: 100.8 10*3/uL (ref 19.0–186.0)
Retic Ct Pct: 2.9 % (ref 0.4–3.1)

## 2021-04-02 LAB — APTT: aPTT: 70 seconds — ABNORMAL HIGH (ref 24–36)

## 2021-04-02 LAB — FOLATE: Folate: 12.6 ng/mL (ref >5.9)

## 2021-04-02 LAB — TSH: TSH: 1.284 u[IU]/mL (ref 0.350–4.500)

## 2021-04-02 LAB — MAGNESIUM: Magnesium: 2 mg/dL (ref 1.7–2.4)

## 2021-04-02 LAB — VITAMIN B12: Vitamin B-12: 546 pg/mL (ref 180–914)

## 2021-04-02 MED ORDER — LEVALBUTEROL HCL 0.63 MG/3ML IN NEBU
0.6300 mg | INHALATION_SOLUTION | Freq: Four times a day (QID) | RESPIRATORY_TRACT | Status: DC
  Administered 2021-04-02: 0.63 mg via RESPIRATORY_TRACT
  Filled 2021-04-02: qty 3

## 2021-04-02 MED ORDER — HEPARIN BOLUS VIA INFUSION
2000.0000 [IU] | Freq: Once | INTRAVENOUS | Status: AC
  Administered 2021-04-02: 2000 [IU] via INTRAVENOUS
  Filled 2021-04-02: qty 2000

## 2021-04-02 MED ORDER — LEVALBUTEROL HCL 0.63 MG/3ML IN NEBU
0.6300 mg | INHALATION_SOLUTION | Freq: Three times a day (TID) | RESPIRATORY_TRACT | Status: DC
  Administered 2021-04-02 – 2021-04-03 (×3): 0.63 mg via RESPIRATORY_TRACT
  Filled 2021-04-02 (×3): qty 3

## 2021-04-02 MED ORDER — FUROSEMIDE 10 MG/ML IJ SOLN: 20.0000 mg | Freq: Two times a day (BID) | INTRAMUSCULAR | Status: DC

## 2021-04-02 MED ORDER — HEPARIN (PORCINE) 25000 UT/250ML-% IV SOLN
1000.0000 [IU]/h | INTRAVENOUS | Status: AC
  Administered 2021-04-02 (×2): 1000 [IU]/h via INTRAVENOUS
  Filled 2021-04-02 (×2): qty 250

## 2021-04-02 MED ORDER — UMECLIDINIUM BROMIDE 62.5 MCG/ACT IN AEPB
2.0000 | INHALATION_SPRAY | Freq: Every day | RESPIRATORY_TRACT | Status: DC
  Administered 2021-04-02 – 2021-04-04 (×3): 2 via RESPIRATORY_TRACT
  Filled 2021-04-02: qty 7

## 2021-04-02 MED ORDER — DIGOXIN 125 MCG PO TABS
0.1250 mg | ORAL_TABLET | Freq: Once | ORAL | Status: AC
  Administered 2021-04-02: 0.125 mg via ORAL
  Filled 2021-04-02: qty 1

## 2021-04-02 MED ORDER — FUROSEMIDE 10 MG/ML IJ SOLN
20.0000 mg | Freq: Every day | INTRAMUSCULAR | Status: DC
  Administered 2021-04-03 – 2021-04-06 (×4): 20 mg via INTRAVENOUS
  Filled 2021-04-02 (×5): qty 2

## 2021-04-02 MED ORDER — NITROGLYCERIN 0.4 MG SL SUBL: 0.4000 mg | SUBLINGUAL_TABLET | SUBLINGUAL | Status: DC | PRN

## 2021-04-02 MED ORDER — DIGOXIN 125 MCG PO TABS
0.1250 mg | ORAL_TABLET | ORAL | Status: DC
  Administered 2021-04-04 – 2021-04-06 (×2): 0.125 mg via ORAL
  Filled 2021-04-02 (×2): qty 1

## 2021-04-02 MED ORDER — ACETAMINOPHEN 325 MG PO TABS: 650.0000 mg | ORAL_TABLET | Freq: Four times a day (QID) | ORAL | Status: DC | PRN

## 2021-04-02 MED ORDER — MONTELUKAST SODIUM 10 MG PO TABS
10.0000 mg | ORAL_TABLET | Freq: Every evening | ORAL | Status: DC
  Administered 2021-04-02 – 2021-04-05 (×4): 10 mg via ORAL
  Filled 2021-04-02 (×4): qty 1

## 2021-04-02 MED ORDER — SODIUM CHLORIDE 0.9 % IV SOLN
250.0000 mg | Freq: Once | INTRAVENOUS | Status: AC
  Administered 2021-04-02: 250 mg via INTRAVENOUS
  Filled 2021-04-02: qty 20

## 2021-04-02 MED ORDER — ACETAMINOPHEN 650 MG RE SUPP: 650.0000 mg | Freq: Four times a day (QID) | RECTAL | Status: DC | PRN

## 2021-04-02 MED ORDER — FUROSEMIDE 10 MG/ML IJ SOLN: 20.0000 mg | Freq: Once | INTRAMUSCULAR | Status: DC

## 2021-04-02 MED ORDER — DILTIAZEM HCL ER COATED BEADS 120 MG PO CP24
120.0000 mg | ORAL_CAPSULE | Freq: Two times a day (BID) | ORAL | Status: DC
  Administered 2021-04-02 (×2): 120 mg via ORAL
  Filled 2021-04-02 (×3): qty 1

## 2021-04-02 NOTE — Progress Notes (Signed)
ANTICOAGULATION CONSULT NOTE - Initial Consult  Pharmacy Consult for heparin Indication: atrial fibrillation  Allergies  Allergen Reactions   Albuterol Nausea And Vomiting    Patient Measurements: Height: 5\' 7"  (170.2 cm) Weight: 64 kg (141 lb) IBW/kg (Calculated) : 61.6  Vital Signs: Temp: 98.2 F (36.8 C) (10/28 1920) Temp Source: Oral (10/28 1920) BP: 124/66 (10/29 0115) Pulse Rate: 117 (10/29 0115)  Labs: Recent Labs    04/01/21 1222 04/01/21 2040  HGB 9.4*  --   HCT 32.8*  --   PLT 249  --   CREATININE 0.92  --   TROPONINIHS 14 14    Estimated Creatinine Clearance: 39.5 mL/min (by C-G formula based on SCr of 0.92 mg/dL).   Medical History: Past Medical History:  Diagnosis Date   Abdominal aortic aneurysm without rupture 01/06/2016   Adrenal adenoma 09/10/2013   Overview:  Left, unchanged per CT 09/10/2013  Formatting of this note might be different from the original. Left, unchanged per CT 09/10/2013   Aneurysm ([place]) 09/10/2013   Formatting of this note might be different from the original. Infrarenal aortic aneurysm 4.7 cm in greatest anteroposterior dimensions per CT ( 09/10/2013 )   Anxiety    Anxiety    Arthritis    Atrial fibrillation ([place]) 09/10/2013   Formatting of this note might be different from the original. f/b Dr. Geraldo Pitter ( [place], [place] ) Xarelto 15mg  daily (decreased from 20mg  daily 4/25) Diltiazem 120 BID Amioderone 200mg  daily added 4/25   Bilateral carotid artery stenosis 01/06/2016   Chest pain 02/05/2013   Closed bicondylar fracture of distal end of left humerus    COPD (chronic obstructive pulmonary disease) ([place])    Depression 09/10/2013   Dyslipidemia    Dysrhythmia    ATRIAL FIBRILATION   Encounter for antineoplastic chemotherapy 11/09/2013   GERD (gastroesophageal reflux disease)    Heart murmur    History of open reduction and internal fixation (ORIF) procedure 11/01/2016   HTN (hypertension) 09/10/2013   Hyperlipidemia 09/10/2013   Hypertension     Hypoxia 02/05/2013   Irregular heart beat    Leaky heart valve    Malignant neoplasm of ovary ([place]) 09/28/2013   Overview:  S/p 4 cycles neoadjuvant chemotherapy 09/23/12 exploratory laparotomy, TAH/BSO, omentectomy   Malignant ovarian neoplasm ([place]) 09/28/2013   Formatting of this note might be different from the original. S/p 4 cycles neoadjuvant chemotherapy 09/23/12 exploratory laparotomy, TAH/BSO, omentectomy   Mood disorder ([place])    Ovarian cancer ([place])    Shortness of breath     Assessment: 87yo female c/o gradually worsening SOB over last three weeks, found to be in Afib with RVR, to begin heparin; of note pt had been on anticoagulation for Afib but was recently taken off by MD, though there are no notes in Epic regarding this (last cards visit was Apr 2022).  Goal of Therapy:  Heparin level 0.3-0.7 units/ml Monitor platelets by anticoagulation protocol: Yes   Plan:  Will give heparin 2000 units IV bolus x1 followed by infusion at 1000 units/hr and monitor heparin levels and CBC.  Wynona Neat, PharmD, BCPS  04/02/2021,1:35 AM

## 2021-04-02 NOTE — Evaluation (Signed)
Physical Therapy Evaluation Patient Details Name: Adriana Castro MRN: [id number] DOB: 02-03-1929 Today's Date: 04/02/2021  History of Present Illness  Pt is a 87 y.o. female admitted 04/01/21 with progressive SOB. Workup for afib with RVR, possible CHF exacerbation. PMH includes COPD, afib, AAA, HTN, ovarian CA, anxiety, depression.   Clinical Impression  Pt presents with an overall decrease in functional mobility secondary to above. PTA, pt mod indep with walker vs cane, lives alone, has sister nearby who assists with ADL/iADLs as needed. Today, pt moving well with RW and min guard for balance with transfers and ambulation. Pt's sister present and supportive, reports able to provide necessary assist upon return home. Pt would benefit from continued acute PT services to maximize functional mobility and independence prior to d/c with HHPT services.   HR 108 with activity SpO2 95% on 2L O2 post-ambulation; unable to get reliable reading w/ ambulation       Recommendations for follow up therapy are one component of a multi-disciplinary discharge planning process, led by the attending physician.  Recommendations may be updated based on patient status, additional functional criteria and insurance authorization.  Follow Up Recommendations Home health PT    Assistance Recommended at Discharge Frequent or constant Supervision/Assistance  Functional Status Assessment Patient has had a recent decline in their functional status and demonstrates the ability to make significant improvements in function in a reasonable and predictable amount of time.  Equipment Recommendations  None recommended by PT    Recommendations for Other Services       Precautions / Restrictions Precautions Precautions: Fall;Other (comment) Precaution Comments: Watch SpO2 (does not wear O2 at home) Restrictions Weight Bearing Restrictions: No      Mobility  Bed Mobility Overal bed mobility: Modified Independent              General bed mobility comments: Increased time and effort, HOB elevated; left seated EOB with sister present per pt request    Transfers Overall transfer level: Needs assistance Equipment used: Rolling walker (2 wheels) Transfers: Sit to/from Stand Sit to Stand: Min guard           General transfer comment: Reliant on momentum to power into standing from EOB to RW    Ambulation/Gait Ambulation/Gait assistance: Counsellor (Feet): 260 Feet Assistive device: Rolling walker (2 wheels) Gait Pattern/deviations: Step-through pattern;Decreased stride length;Trunk flexed Gait velocity: Decreased   General Gait Details: Slow, mostly steady gait with RW and intermittent min guard for balance, cues for pt to self-monitor activity tolerance; further distance limited by fatigue  Stairs            Wheelchair Mobility    Modified Rankin (Stroke Patients Only)       Balance Overall balance assessment: Needs assistance   Sitting balance-Leahy Scale: Fair Sitting balance - Comments: able to doff bilateral socks, required assist to don   Standing balance support: Bilateral upper extremity supported;During functional activity;Single extremity supported;Reliant on assistive device for balance Standing balance-Leahy Scale: Poor Standing balance comment: Reliant on UE support                             Pertinent Vitals/Pain Pain Assessment: No/denies pain    Home Living Family/patient expects to be discharged to:: Private residence Living Arrangements: Alone Available Help at Discharge: Family;Available 24 hours/day Type of Home: House Home Access: Stairs to enter Entrance Stairs-Rails: Right Entrance Stairs-Number of Steps: 1+1   Home Layout:  One level Home Equipment: Conservation officer, nature (2 wheels);Rollator (4 wheels);Cane - single point Additional Comments: Younger sister plans to provide initial assist upon return home    Prior Function  Prior Level of Function : Needs assist       Physical Assist : Mobility (physical);ADLs (physical)     Mobility Comments: Mod indep ambulating with rollator vs. SPC; does not drive ADLs Comments: Goes to sister's home for getting into shower, or else does bird baths at sink. Has cleaning service 1x/month for household tasks. Able to prep simple meals     Hand Dominance        Extremity/Trunk Assessment   Upper Extremity Assessment Upper Extremity Assessment: Generalized weakness    Lower Extremity Assessment Lower Extremity Assessment: Generalized weakness       Communication   Communication: No difficulties  Cognition Arousal/Alertness: Awake/alert Behavior During Therapy: WFL for tasks assessed/performed Overall Cognitive Status: Impaired/Different from baseline                                 General Comments: WFL for majority of simple tasks, good historian, but sister does not some confusion; suspect some short-term memory and attention deficits, pt attributes to having just woken up        General Comments General comments (skin integrity, edema, etc.): Pt's sister present and supportive, reports she will plan to stay with pt for a few days upon return home    Exercises     Assessment/Plan    PT Assessment Patient needs continued PT services  PT Problem List Decreased strength;Decreased activity tolerance;Decreased balance;Decreased mobility;Decreased knowledge of use of DME;Cardiopulmonary status limiting activity       PT Treatment Interventions DME instruction;Gait training;Patient/family education;Functional mobility training;Stair training;Therapeutic activities;Therapeutic exercise;Balance training    PT Goals (Current goals can be found in the Care Plan section)  Acute Rehab PT Goals Patient Stated Goal: "I'm going home" PT Goal Formulation: With patient Time For Goal Achievement: 04/16/21 Potential to Achieve Goals: Good     Frequency Min 3X/week   Barriers to discharge        Co-evaluation               AM-PAC PT "6 Clicks" Mobility  Outcome Measure Help needed turning from your back to your side while in a flat bed without using bedrails?: None Help needed moving from lying on your back to sitting on the side of a flat bed without using bedrails?: A Little Help needed moving to and from a bed to a chair (including a wheelchair)?: A Little Help needed standing up from a chair using your arms (e.g., wheelchair or bedside chair)?: A Little Help needed to walk in hospital room?: A Little Help needed climbing 3-5 steps with a railing? : A Little 6 Click Score: 19    End of Session Equipment Utilized During Treatment: Gait belt;Oxygen Activity Tolerance: Patient tolerated treatment well Patient left: in bed;with call bell/phone within reach;with bed alarm set;with family/visitor present Nurse Communication: Mobility status PT Visit Diagnosis: Muscle weakness (generalized) (M62.81);Other abnormalities of gait and mobility (R26.89)    Time: 8676-1950 PT Time Calculation (min) (ACUTE ONLY): 21 min   Charges:   PT Evaluation $PT Eval Moderate Complexity: [place], PT, DPT Acute Rehabilitation Services  Pager [phone] Office [place] 04/02/2021, 4:56 PM

## 2021-04-02 NOTE — Progress Notes (Signed)
ANTICOAGULATION CONSULT NOTE - Initial Consult  Pharmacy Consult for heparin Indication: atrial fibrillation  Allergies  Allergen Reactions   Albuterol Nausea And Vomiting    Patient Measurements: Height: 5\' 7"  (170.2 cm) Weight: 60.3 kg (132 lb 15 oz) IBW/kg (Calculated) : 61.6  Vital Signs: Temp: 98.6 F (37 C) (10/29 1920) Temp Source: Oral (10/29 1920) BP: 140/89 (10/29 1920) Pulse Rate: 81 (10/29 1915)  Labs: Recent Labs    04/01/21 1222 04/01/21 2040 04/02/21 0352 04/02/21 1305 04/02/21 2033  HGB 9.4*  --  8.3* 8.3*  --   HCT 32.8*  --  29.3* 29.4*  --   PLT 249  --  240 214  --   APTT  --   --   --  70*  --   HEPARINUNFRC  --   --   --  0.33 0.41  CREATININE 0.92  --  1.07*  --   --   TROPONINIHS 14 14  --   --   --     Estimated Creatinine Clearance: 33.3 mL/min (A) (by C-G formula based on SCr of 1.07 mg/dL (H)).   Medical History: Past Medical History:  Diagnosis Date   Abdominal aortic aneurysm without rupture 01/06/2016   Adrenal adenoma 09/10/2013   Overview:  Left, unchanged per CT 09/10/2013  Formatting of this note might be different from the original. Left, unchanged per CT 09/10/2013   Aneurysm ([place]) 09/10/2013   Formatting of this note might be different from the original. Infrarenal aortic aneurysm 4.7 cm in greatest anteroposterior dimensions per CT ( 09/10/2013 )   Anxiety    Anxiety    Arthritis    Atrial fibrillation ([place]) 09/10/2013   Formatting of this note might be different from the original. f/b Dr. Geraldo Pitter ( [place], [place] ) Xarelto 15mg  daily (decreased from 20mg  daily 4/25) Diltiazem 120 BID Amioderone 200mg  daily added 4/25   Bilateral carotid artery stenosis 01/06/2016   Chest pain 02/05/2013   Closed bicondylar fracture of distal end of left humerus    COPD (chronic obstructive pulmonary disease) ([place])    Depression 09/10/2013   Dyslipidemia    Dysrhythmia    ATRIAL FIBRILATION   Encounter for antineoplastic chemotherapy 11/09/2013   GERD  (gastroesophageal reflux disease)    Heart murmur    History of open reduction and internal fixation (ORIF) procedure 11/01/2016   HTN (hypertension) 09/10/2013   Hyperlipidemia 09/10/2013   Hypertension    Hypoxia 02/05/2013   Irregular heart beat    Leaky heart valve    Malignant neoplasm of ovary ([place]) 09/28/2013   Overview:  S/p 4 cycles neoadjuvant chemotherapy 09/23/12 exploratory laparotomy, TAH/BSO, omentectomy   Malignant ovarian neoplasm ([place]) 09/28/2013   Formatting of this note might be different from the original. S/p 4 cycles neoadjuvant chemotherapy 09/23/12 exploratory laparotomy, TAH/BSO, omentectomy   Mood disorder ([place])    Ovarian cancer ([place])    Shortness of breath     Assessment: 87yo female c/o gradually worsening SOB over last three weeks, found to be in Afib with RVR, to begin heparin; of note pt had been on anticoagulation for Afib but was recently taken off by MD.   Heparin level 0.41 is therapeutic on 1000 units/hr.  Goal of Therapy:  Heparin level 0.3-0.7 units/ml Monitor platelets by anticoagulation protocol: Yes   Plan:  Continue heparin at 1000 u/h  Monitor daily HL, CBC/plt Monitor for signs/symptoms of bleeding  F/u restart DOAC   Thank you for allowing pharmacy to  participate in this patient's care.  Benetta Spar, PharmD, BCPS, BCCP Clinical Pharmacist  Please check AMION for all [facility] phone numbers After 10:00 PM, call [place] [phone]

## 2021-04-02 NOTE — H&P (Signed)
History and Physical    Adriana Castro YTK:[id number] DOB: [birth year] DOA: 04/01/2021  PCP: Nicholos Johns, MD  Patient coming from: Home.  Chief Complaint: Shortness of breath.  HPI: Adriana Castro is a 87 y.o. female with known history of atrial fibrillation, COPD, hypertension, abdominal aortic aneurysm presents to the ER because of increasing shortness of breath over the last 3 weeks since she got the COVID booster.  Patient shortness of breath is mostly exertional denies any associated chest pain.  Denies productive cough fever or chills.  Patient primary care physician has recently discontinued her Xarelto.  Presents with nocturia.  Patient admits to have not been compliant with her medications for last few days.  ED Course: In the ER patient appears short of breath and monitor shows A. fib with RVR.  Patient's chest x-ray shows bilateral pleural effusion.  Patient was given Lasix 20 mg IV started on Cardizem infusion after Cardizem bolus for A. fib.  Patient's hemoglobin is around 9.4 2012.7 in October 2021.  Patient denies any bleeding.  It has been lower previously in 2019.  COVID test is negative.  Review of Systems: As per HPI, rest all negative.   Past Medical History:  Diagnosis Date   Abdominal aortic aneurysm without rupture 01/06/2016   Adrenal adenoma 09/10/2013   Overview:  Left, unchanged per CT 09/10/2013  Formatting of this note might be different from the original. Left, unchanged per CT 09/10/2013   Aneurysm ([place]) 09/10/2013   Formatting of this note might be different from the original. Infrarenal aortic aneurysm 4.7 cm in greatest anteroposterior dimensions per CT ( 09/10/2013 )   Anxiety    Anxiety    Arthritis    Atrial fibrillation ([place]) 09/10/2013   Formatting of this note might be different from the original. f/b Dr. Geraldo Pitter ( [place], [place] ) Xarelto 15mg  daily (decreased from 20mg  daily 4/25) Diltiazem 120 BID Amioderone 200mg  daily added 4/25   Bilateral carotid artery  stenosis 01/06/2016   Chest pain 02/05/2013   Closed bicondylar fracture of distal end of left humerus    COPD (chronic obstructive pulmonary disease) ([place])    Depression 09/10/2013   Dyslipidemia    Dysrhythmia    ATRIAL FIBRILATION   Encounter for antineoplastic chemotherapy 11/09/2013   GERD (gastroesophageal reflux disease)    Heart murmur    History of open reduction and internal fixation (ORIF) procedure 11/01/2016   HTN (hypertension) 09/10/2013   Hyperlipidemia 09/10/2013   Hypertension    Hypoxia 02/05/2013   Irregular heart beat    Leaky heart valve    Malignant neoplasm of ovary ([place]) 09/28/2013   Overview:  S/p 4 cycles neoadjuvant chemotherapy 09/23/12 exploratory laparotomy, TAH/BSO, omentectomy   Malignant ovarian neoplasm ([place]) 09/28/2013   Formatting of this note might be different from the original. S/p 4 cycles neoadjuvant chemotherapy 09/23/12 exploratory laparotomy, TAH/BSO, omentectomy   Mood disorder ([place])    Ovarian cancer ([place])    Shortness of breath     Past Surgical History:  Procedure Laterality Date   BREAST BIOPSY     CATARACT EXTRACTION, BILATERAL     ORIF HUMERUS FRACTURE Left 11/01/2016   Procedure: OPEN REDUCTION INTERNAL FIXATION (ORIF) LEFT SUPRACONDYLAR HUMERUS AND MEDIAL EPICONDYLE;  Surgeon: Leandrew Koyanagi, MD;  Location: [place];  Service: Orthopedics;  Laterality: Left;   SHOULDER SURGERY Right    ?   2002      SKIN CANCER EXCISION     X 5   TONSILLECTOMY  reports that she quit smoking about 8 years ago. She has never used smokeless tobacco. She reports that she does not drink alcohol and does not use drugs.  Allergies  Allergen Reactions   Albuterol Nausea And Vomiting    Family History  Problem Relation Age of Onset   Diabetes Mother    Heart attack Mother    Heart disease Mother    Hypertension Mother    Asthma Father    Stroke Father    Breast cancer Sister    Prostate cancer Brother    Diabetes Brother    Stroke Brother      Prior to Admission medications   Medication Sig Start Date End Date Taking? Authorizing Provider  digoxin (LANOXIN) 0.125 MG tablet TAKE 1 TABLET (0.125 MG TOTAL) BY MOUTH EVERY MONDAY, WEDNESDAY, AND FRIDAY. Patient taking differently: Take 0.125 mg by mouth every Monday, Wednesday, and Friday. 01/21/21  Yes Revankar, Reita Cliche, MD  diltiazem (CARDIZEM CD) 120 MG 24 hr capsule Take 120 mg by mouth 2 (two) times daily. 01/21/20  Yes [provider]  fluticasone (FLONASE) 50 MCG/ACT nasal spray Place 1 spray into both nostrils daily as needed for allergies. 11/22/15  Yes [provider]  furosemide (LASIX) 20 MG tablet Take 20 mg by mouth daily as needed for fluid. 09/30/19  Yes [provider]  INCRUSE ELLIPTA 62.5 MCG/INH AEPB Inhale 2 puffs into the lungs daily. 08/10/20  Yes [provider]  ipratropium (ATROVENT) 0.02 % nebulizer solution Inhale 0.25 mg into the lungs 4 (four) times daily as needed for wheezing or shortness of breath. 03/30/21  Yes [provider]  montelukast (SINGULAIR) 10 MG tablet Take 10 mg by mouth every evening. 10/09/16  Yes [provider]  nitroGLYCERIN (NITROSTAT) 0.4 MG SL tablet PLACE 1 TABLET UNDER TONGUE EVERY 5 MINS, UP TO 3 DOSES AS NEEDED FOR CHEST PAIN Patient taking differently: Place 0.4 mg under the tongue every 5 (five) minutes as needed for chest pain. 12/08/20  Yes Revankar, Reita Cliche, MD  omeprazole (PRILOSEC) 20 MG capsule Take 20 mg by mouth daily as needed for indigestion.   Yes [provider]  Vitamin D, Ergocalciferol, (DRISDOL) 1.25 MG (50000 UNIT) CAPS capsule Take 50,000 Units by mouth every 7 (seven) days. No set day of the week   Yes [provider]  vitamin E 400 UNIT capsule Take 800 Units by mouth daily.   Yes [provider]  XOPENEX HFA 45 MCG/ACT inhaler Inhale 2 puffs into the lungs in the morning, at noon, in the evening, and at bedtime. 08/05/20  Yes [provider]  atorvastatin (LIPITOR) 10 MG tablet Take 5 mg by mouth daily. Patient not taking: No sig reported 10/21/20   [provider]  azithromycin (ZITHROMAX) 250 MG tablet Take 250 mg by mouth See admin instructions. 2 tabs x 1 day 1 tab x 4 days Patient not taking: Reported on 04/01/2021 03/22/21   [provider]  predniSONE (STERAPRED UNI-PAK 21 TAB) 10 MG (21) TBPK tablet Take 10 mg by mouth See admin instructions. 6,5,4,3,2,1 Patient not taking: Reported on 04/01/2021 03/22/21   [provider]  Rivaroxaban (XARELTO) 15 MG TABS tablet Take 1 tablet (15 mg total) by mouth daily with supper. Patient not taking: No sig reported 09/09/20   Revankar, Reita Cliche, MD    Physical Exam: Constitutional: Moderately built and nourished. Vitals:   04/01/21 2330 04/02/21 0000 04/02/21 0015 04/02/21 0030  BP: 120/70 113/83 Marland Kitchen)  123/56 (!) 141/79  Pulse: (!) 108 (!) 116 (!) 34 (!) 105  Resp: (!) 34 (!) 25 19 (!) 33  Temp:      TempSrc:      SpO2: (!) 88% 91% 91% (!) 89%  Weight:      Height:       Eyes: Anicteric no pallor. ENMT: No discharge from the ears eyes nose and mouth. Neck: No mass felt.  No neck rigidity. Respiratory: No rhonchi or crepitations. Cardiovascular: S1-S2 heard. Abdomen: Soft nontender bowel sound present. Musculoskeletal: Mild edema of the lower extremities. Skin: No rash. Neurologic: Alert awake oriented to time place and person.  Moves all extremities. Psychiatric: Appears normal.  Normal affect.   Labs on Admission: I have personally reviewed following labs and imaging studies  CBC: Recent Labs  Lab 04/01/21 1222  WBC 11.0*  NEUTROABS 9.3*  HGB 9.4*  HCT 32.8*  MCV 84.3  PLT 299   Basic Metabolic Panel: Recent Labs  Lab 04/01/21 1222  NA 138  K 4.4  CL 105  CO2 23  GLUCOSE 103*  BUN 15  CREATININE 0.92  CALCIUM 9.1   GFR: Estimated Creatinine Clearance: 39.5 mL/min (by C-G formula based on SCr of 0.92  mg/dL). Liver Function Tests: Recent Labs  Lab 04/01/21 1222  AST 15  ALT 19  ALKPHOS 78  BILITOT 1.3*  PROT 6.1*  ALBUMIN 3.4*   No results for input(s): LIPASE, AMYLASE in the last 168 hours. No results for input(s): AMMONIA in the last 168 hours. Coagulation Profile: No results for input(s): INR, PROTIME in the last 168 hours. Cardiac Enzymes: No results for input(s): CKTOTAL, CKMB, CKMBINDEX, TROPONINI in the last 168 hours. BNP (last 3 results) No results for input(s): PROBNP in the last 8760 hours. HbA1C: No results for input(s): HGBA1C in the last 72 hours. CBG: Recent Labs  Lab 04/01/21 2353  GLUCAP 103*   Lipid Profile: No results for input(s): CHOL, HDL, LDLCALC, TRIG, CHOLHDL, LDLDIRECT in the last 72 hours. Thyroid Function Tests: No results for input(s): TSH, T4TOTAL, FREET4, T3FREE, THYROIDAB in the last 72 hours. Anemia Panel: No results for input(s): VITAMINB12, FOLATE, FERRITIN, TIBC, IRON, RETICCTPCT in the last 72 hours. Urine analysis:    Component Value Date/Time   COLORURINE YELLOW 02/05/2013 1402   APPEARANCEUR CLEAR 02/05/2013 1402   LABSPEC 1.021 02/05/2013 1402   PHURINE 5.0 02/05/2013 1402   GLUCOSEU NEGATIVE 02/05/2013 1402   HGBUR NEGATIVE 02/05/2013 1402   BILIRUBINUR SMALL (A) 02/05/2013 1402   KETONESUR 15 (A) 02/05/2013 1402   PROTEINUR NEGATIVE 02/05/2013 1402   UROBILINOGEN 0.2 02/05/2013 1402   NITRITE NEGATIVE 02/05/2013 1402   LEUKOCYTESUR TRACE (A) 02/05/2013 1402   Sepsis Labs: @LABRCNTIP (procalcitonin:4,lacticidven:4) ) Recent Results (from the past 240 hour(s))  Resp Panel by RT-PCR (Flu A&B, Covid) Nasopharyngeal Swab     Status: None   Collection Time: 04/01/21 12:44 PM   Specimen: Nasopharyngeal Swab; Nasopharyngeal(NP) swabs in vial transport medium  Result Value Ref Range Status   SARS Coronavirus 2 by RT PCR NEGATIVE NEGATIVE Final    Comment: (NOTE) SARS-CoV-2 target nucleic acids are NOT DETECTED.  The  SARS-CoV-2 RNA is generally detectable in upper respiratory specimens during the acute phase of infection. The lowest concentration of SARS-CoV-2 viral copies this assay can detect is 138 copies/mL. A negative result does not preclude SARS-Cov-2 infection and should not be used as the sole basis for treatment or other patient management decisions. A negative result may occur  with  improper specimen collection/handling, submission of specimen other than nasopharyngeal swab, presence of viral mutation(s) within the areas targeted by this assay, and inadequate number of viral copies(<138 copies/mL). A negative result must be combined with clinical observations, patient history, and epidemiological information. The expected result is Negative.  Fact Sheet for Patients:  EntrepreneurPulse.com.au  Fact Sheet for Healthcare Providers:  IncredibleEmployment.be  This test is no t yet approved or cleared by the [place] FDA and  has been authorized for detection and/or diagnosis of SARS-CoV-2 by FDA under an Emergency Use Authorization (EUA). This EUA will remain  in effect (meaning this test can be used) for the duration of the COVID-19 declaration under Section 564(b)(1) of the Act, 21 U.S.C.section 360bbb-3(b)(1), unless the authorization is terminated  or revoked sooner.       Influenza A by PCR NEGATIVE NEGATIVE Final   Influenza B by PCR NEGATIVE NEGATIVE Final    Comment: (NOTE) The Xpert Xpress SARS-CoV-2/FLU/RSV plus assay is intended as an aid in the diagnosis of influenza from Nasopharyngeal swab specimens and should not be used as a sole basis for treatment. Nasal washings and aspirates are unacceptable for Xpert Xpress SARS-CoV-2/FLU/RSV testing.  Fact Sheet for Patients: EntrepreneurPulse.com.au  Fact Sheet for Healthcare Providers: IncredibleEmployment.be  This test is not yet approved or  cleared by the [place] FDA and has been authorized for detection and/or diagnosis of SARS-CoV-2 by FDA under an Emergency Use Authorization (EUA). This EUA will remain in effect (meaning this test can be used) for the duration of the COVID-19 declaration under Section 564(b)(1) of the Act, 21 U.S.C. section 360bbb-3(b)(1), unless the authorization is terminated or revoked.  Performed at [facility], [place] [address], [place], [place] [address]      Radiological Exams on Admission: DG Chest 2 View  Result Date: 04/01/2021 CLINICAL DATA:  Shortness of breath EXAM: CHEST - 2 VIEW COMPARISON:  Chest x-ray dated February 26, 2020 FINDINGS: Cardiac and mediastinal contours are partially obscured but appeared within normal limits. Small left greater than right pleural effusions with associated atelectasis. No focal consolidation. Diffuse bilateral interstitial opacities. No evidence of pneumothorax. IMPRESSION: Bilateral interstitial opacities and small bilateral pleural effusions, likely due to pulmonary edema. Electronically Signed   By: Yetta Glassman M.D.   On: 04/01/2021 13:10   DG Abdomen 1 View  Result Date: 04/01/2021 CLINICAL DATA:  Constipation. EXAM: ABDOMEN - 1 VIEW COMPARISON:  Abdominopelvic CT 02/25/2018, CTA 03/19/2019 FINDINGS: Small volume of stool throughout the colon, no increased stool burden to suggest constipation. No bowel dilatation or evidence of obstruction. No free intra-abdominal air. Advanced aortic atherosclerosis. Infrarenal aortic aneurysm with aortic calcifications spanning 6.8 cm. Aneurysm measured 6.5 cm when measured on prior scout radiograph. The bones are diffusely under mineralized. IMPRESSION: 1. Small volume of colonic stool, no increased stool burden to suggest constipation. No bowel obstruction. 2. Aortic atherosclerosis. Infrarenal aortic aneurysm with aortic calcifications spanning 6.8 cm. Aneurysm measured 6.5 cm on prior scout radiograph,  and 5.8 cm when measured on prior CTA. Recommend follow-up assessment with CTA, acuity based on patient's symptoms. Electronically Signed   By: Keith Rake M.D.   On: 04/01/2021 22:00    EKG: Independently reviewed.  A. fib with RVR.  Assessment/Plan Principal Problem:   Atrial fibrillation with rapid ventricular response (HCC) Active Problems:   Acute on chronic heart failure (HCC)    A. fib with RVR could be likely precipitated by patient being noncompliant with her  medication.  Patient digoxin levels were undetectable.  Patient does agree to have been noncompliant with some of her medications.  Patient is presently on Cardizem infusion we will restart her Cardizem p.o. tablets and also digoxin.  Patient's anticoagulants were recently discontinued by her primary care physician reasons of which was not clear.  For now I will place Patient on heparin.  If he can get in touch with the primary care physician in the morning receive IV anticoagulants were discontinued.  Check TSH. Acute decompensated CHF likely precipitated by patient's A. fib with RVR.  Lasix 20 mg IV every 12 has been ordered.  Closely monitor intake output Daily weights check 2D echo with EF.  Last EF was 50 to 55% in 2019. Worsening anemia which could also be contributing to patient's symptoms.  Follow CBC.  Check anemia panel. History of abdominal aortic aneurysm denies any abdominal pain.  Closely monitor.  There is any further worsening of anemia we will scan the abdomen. Hypertension presently on Cardizem. COPD as per the ER physician patient was initially wheezing.  On my exam patient wheezes have improved continue home inhalers.  Since patient has acute CHF with A. fib with RVR and worsening anemia will need further work-up and management and inpatient status.   DVT prophylaxis: Heparin. Code Status: Full code. Family Communication: Patient's sister. Disposition Plan: Home. Consults called: We will consult  cardiology. Admission status: Inpatient.   Rise Patience MD [facility] Pager [phone].  If 7PM-7AM, please contact night-coverage [email or web address] Password [email or web address]  04/02/2021, 1:26 AM

## 2021-04-02 NOTE — Progress Notes (Signed)
*  PRELIMINARY RESULTS* Echocardiogram 2D Echocardiogram has been performed.  Adriana Castro 04/02/2021, 12:31 PM

## 2021-04-02 NOTE — Progress Notes (Addendum)
ANTICOAGULATION CONSULT NOTE - Initial Consult  Pharmacy Consult for heparin Indication: atrial fibrillation  Allergies  Allergen Reactions   Albuterol Nausea And Vomiting    Patient Measurements: Height: 5\' 7"  (170.2 cm) Weight: 60.3 kg (132 lb 15 oz) IBW/kg (Calculated) : 61.6  Vital Signs: Temp: 98.4 F (36.9 C) (10/29 0508) Temp Source: Oral (10/29 0508) BP: 137/74 (10/29 1059) Pulse Rate: 98 (10/29 1332)  Labs: Recent Labs    04/01/21 1222 04/01/21 2040 04/02/21 0352 04/02/21 1305  HGB 9.4*  --  8.3* 8.3*  HCT 32.8*  --  29.3* 29.4*  PLT 249  --  240 214  APTT  --   --   --  70*  HEPARINUNFRC  --   --   --  0.33  CREATININE 0.92  --  1.07*  --   TROPONINIHS 14 14  --   --      Estimated Creatinine Clearance: 33.3 mL/min (A) (by C-G formula based on SCr of 1.07 mg/dL (H)).   Medical History: Past Medical History:  Diagnosis Date   Abdominal aortic aneurysm without rupture 01/06/2016   Adrenal adenoma 09/10/2013   Overview:  Left, unchanged per CT 09/10/2013  Formatting of this note might be different from the original. Left, unchanged per CT 09/10/2013   Aneurysm ([place]) 09/10/2013   Formatting of this note might be different from the original. Infrarenal aortic aneurysm 4.7 cm in greatest anteroposterior dimensions per CT ( 09/10/2013 )   Anxiety    Anxiety    Arthritis    Atrial fibrillation ([place]) 09/10/2013   Formatting of this note might be different from the original. f/b Dr. Geraldo Pitter ( [place], [place] ) Xarelto 15mg  daily (decreased from 20mg  daily 4/25) Diltiazem 120 BID Amioderone 200mg  daily added 4/25   Bilateral carotid artery stenosis 01/06/2016   Chest pain 02/05/2013   Closed bicondylar fracture of distal end of left humerus    COPD (chronic obstructive pulmonary disease) ([place])    Depression 09/10/2013   Dyslipidemia    Dysrhythmia    ATRIAL FIBRILATION   Encounter for antineoplastic chemotherapy 11/09/2013   GERD (gastroesophageal reflux disease)    Heart  murmur    History of open reduction and internal fixation (ORIF) procedure 11/01/2016   HTN (hypertension) 09/10/2013   Hyperlipidemia 09/10/2013   Hypertension    Hypoxia 02/05/2013   Irregular heart beat    Leaky heart valve    Malignant neoplasm of ovary ([place]) 09/28/2013   Overview:  S/p 4 cycles neoadjuvant chemotherapy 09/23/12 exploratory laparotomy, TAH/BSO, omentectomy   Malignant ovarian neoplasm ([place]) 09/28/2013   Formatting of this note might be different from the original. S/p 4 cycles neoadjuvant chemotherapy 09/23/12 exploratory laparotomy, TAH/BSO, omentectomy   Mood disorder ([place])    Ovarian cancer ([place])    Shortness of breath     Assessment: 87yo female c/o gradually worsening SOB over last three weeks, found to be in Afib with RVR, to begin heparin; of note pt had been on anticoagulation for Afib but was recently taken off by MD. Heparin started at 1000 units/hr resulting in therapeutic heparin level of 0.33 and aPTT of 70. CBC stable.  Goal of Therapy:  Heparin level 0.3-0.7 units/ml Monitor platelets by anticoagulation protocol: Yes   Plan:  Continue heparin at 1000 u/h  Check confirmatory heparin level in 8 hours Daily Heparin level and CBC  Thank you for allowing pharmacy to participate in this patient's care.  Reatha Harps, PharmD PGY1 Pharmacy Resident 04/02/2021 2:10  PM Check [email or web address] for unit specific pharmacy number

## 2021-04-02 NOTE — Progress Notes (Signed)
PROGRESS NOTE  Adriana Castro XIH:[id number] DOB: 02-03-1929   PCP: Nicholos Johns, MD  Patient is from: Home. Lives alone. Uses cane at times  DOA: 04/01/2021 LOS: 0  Chief complaints:  Chief Complaint  Patient presents with   Shortness of Breath     Brief Narrative / Interim history: 87 year old F with PMH of COPD, A. fib off AC, AAA, HTN, ovarian cancer, anxiety and depression presenting with progressive SOB and DOE for 3 weeks, and admitted for A. fib with RVR and possible CHF exacerbation.  Patient was taken off Xarelto by PCP about 3 weeks ago.  She denies melena, hematochezia or fall.  Concerned about noncompliance with the other cardiac medications on admission although she denies.  Started on Cardizem drip, IV heparin and IV Lasix.  Subjective: Seen and examined earlier this morning.  No major events this morning.  She thinks she is ready to go home stating that her asthma is better.  She is awake and oriented x4 but no insight into her A. fib and possible CHF.  She denies chest pain, dyspnea, palpitation, dizziness, GI or UTI symptoms.  She denies melena or hematochezia.  Patient's sister at bedside.  Patient states that she is DNR/DNI.  Objective: Vitals:   04/02/21 0826 04/02/21 1059 04/02/21 1100 04/02/21 1103  BP:  137/74    Pulse: 91     Resp: (!) 24     Temp:      TempSrc:      SpO2: 96%  96% 92%  Weight:      Height:        Intake/Output Summary (Last 24 hours) at 04/02/2021 1116 Last data filed at 04/02/2021 0552 Gross per 24 hour  Intake 147.61 ml  Output 250 ml  Net -102.39 ml   Filed Weights   04/01/21 1920 04/02/21 0508  Weight: 64 kg 60.3 kg    Examination:  GENERAL: No apparent distress.  Nontoxic. HEENT: MMM.  Vision and hearing grossly intact.  NECK: Supple.  No apparent JVD.  RESP: 93% on 3 L by [place].  No IWOB.  Fair aeration bilaterally. CVS: IR at 100-110. Heart sounds normal.  ABD/GI/GU: BS+. Abd soft, NTND.  MSK/EXT:  Moves extremities.  No apparent deformity. No edema.  SKIN: no apparent skin lesion or wound NEURO: Awake, alert and oriented appropriately.  No apparent focal neuro deficit. PSYCH: Calm. Normal affect.   Procedures:  None  Microbiology summarized: [id number] and influenza PCR nonreactive.  Assessment & Plan: A. fib with RVR-remains in RVR with HR ranging from 100-110.  Concern about noncompliance with her medications although she denied this to me.  Digoxin level is undetectable.  Taken off Xarelto by PCP about 3 weeks ago but unclear what this decision was made.  May be due to anemia.  She denies melena or hematochezia.  She denies frequent fall.  TSH within normal. -Continue Cardizem drip  -Continue home Cardizem CD 120 mg daily -Give digoxin 0.125 mg today, then resume home dose MWF -Continue IV heparin for anticoagulation. -Follow TTE -Diuretics as below.  Acute CHF?  TTE in 2019 with LVEF of 50 to 55%, unknown DD, mild to moderate MR.  Appears euvolemic on exam.  CXR with bilateral interstitial opacities and small bilateral pleural effusion raising concern for pulmonary edema.  BNP slightly elevated to 243.  On p.o. Lasix 20 mg daily as needed at home. Cr slightly up. -Reduce IV Lasix to 20 mg once daily -Follow TTE -Monitor fluid status, renal functions  and electrolytes -Sodium and fluid restrictions  Acute respiratory failure with hypoxia: Desaturated to 70s and 80s last night.  Currently saturating in lower to mid 90s on 3 L.  Suspect this to be due to A. fib and CHF.  He also have underlying COPD.  -Wean oxygen as able.  Minimum oxygen to keep saturation above 88%. -Manage A. fib and CHF as above -Incentive spirometry/OOB/PT/OT  Chronic COPD: Doubt exacerbation. -Continue scheduled Xopenex and add as needed -Added Pulmicort -Hold off steroid for now  Iron deficiency anemia: Iron saturation 10%.  Ferritin low normal.  TIBC high normal. Recent Labs    04/01/21 1222 04/02/21 0352  HGB 9.4*  8.3*  -IV ferric gluconate 250 mg x 1 -Recheck CBC -Check Hemoccult  History of infrarenal abdominal aortic aneurysm-followed by vascular surgery. Infrarenal aortic aneurysm with aortic calcifications spanning 6.8 cm. 6.5 cm on prior scout radiograph, and 5.8 cm on CTA in 2020.  -Follow-up CTA recommended, acuity based on patient's symptoms. -We will obtain abdominal aortic Doppler -Optimize blood pressure control  History of ovarian cancer-S/p 4 cycles neoadjuvant chemotherapy 09/23/12 exploratory laparotomy, TAH/BSO, omentectomy  Anxiety and depression: Stable.  Not on medication at home  Generalized weakness/physical deconditioning -PT/OT eval  Goal of care counseling: Admitted as full code.  However, after discussion about code status, patient stated that she is DNR/DNI.  Patient's sister at bedside, and in agreement.  Body mass index is 20.82 kg/m.         DVT prophylaxis:  On IV heparin for A. fib  Code Status: DNR/DNI Family Communication: Updated patient's sister at bedside. Level of care: Progressive Status is: Inpatient  Remains inpatient appropriate because: Hemodynamically unstable requiring IV Cardizem, hypoxic respiratory failure requiring supplemental oxygen, and further diagnostic evaluation including echocardiogram, abdominal ultrasound and evaluation for anemia       Consultants:  None   Sch Meds:  Scheduled Meds:  [START ON 04/04/2021] digoxin  0.125 mg Oral Q M,W,F   diltiazem  120 mg Oral BID   [START ON 04/03/2021] furosemide  20 mg Intravenous Daily   levalbuterol  0.63 mg Inhalation TID   montelukast  10 mg Oral QPM   umeclidinium bromide  2 puff Inhalation Daily   Continuous Infusions:  diltiazem (CARDIZEM) infusion 10 mg/hr (04/02/21 0540)   ferric gluconate (FERRLECIT) IVPB     heparin 1,000 Units/hr (04/02/21 0409)   PRN Meds:.acetaminophen **OR** acetaminophen, nitroGLYCERIN  Antimicrobials: Anti-infectives (From admission,  onward)    None        I have personally reviewed the following labs and images: CBC: Recent Labs  Lab 04/01/21 1222 04/02/21 0352  WBC 11.0* 10.5  NEUTROABS 9.3*  --   HGB 9.4* 8.3*  HCT 32.8* 29.3*  MCV 84.3 83.2  PLT 249 240   BMP &GFR Recent Labs  Lab 04/01/21 1222 04/02/21 0352  NA 138 139  K 4.4 4.0  CL 105 102  CO2 23 27  GLUCOSE 103* 112*  BUN 15 18  CREATININE 0.92 1.07*  CALCIUM 9.1 8.8*  MG  --  2.0   Estimated Creatinine Clearance: 33.3 mL/min (A) (by C-G formula based on SCr of 1.07 mg/dL (H)). Liver & Pancreas: Recent Labs  Lab 04/01/21 1222  AST 15  ALT 19  ALKPHOS 78  BILITOT 1.3*  PROT 6.1*  ALBUMIN 3.4*   No results for input(s): LIPASE, AMYLASE in the last 168 hours. No results for input(s): AMMONIA in the last 168 hours. Diabetic: No results for  input(s): HGBA1C in the last 72 hours. Recent Labs  Lab 04/01/21 2353  GLUCAP 103*   Cardiac Enzymes: No results for input(s): CKTOTAL, CKMB, CKMBINDEX, TROPONINI in the last 168 hours. No results for input(s): PROBNP in the last 8760 hours. Coagulation Profile: No results for input(s): INR, PROTIME in the last 168 hours. Thyroid Function Tests: Recent Labs    04/02/21 0352  TSH 1.284   Lipid Profile: No results for input(s): CHOL, HDL, LDLCALC, TRIG, CHOLHDL, LDLDIRECT in the last 72 hours. Anemia Panel: Recent Labs    04/02/21 0352  VITAMINB12 546  FOLATE 12.6  FERRITIN 30  TIBC 319  IRON 33  RETICCTPCT 2.9   Urine analysis:    Component Value Date/Time   COLORURINE YELLOW 02/05/2013 1402   APPEARANCEUR CLEAR 02/05/2013 1402   LABSPEC 1.021 02/05/2013 1402   PHURINE 5.0 02/05/2013 1402   GLUCOSEU NEGATIVE 02/05/2013 1402   HGBUR NEGATIVE 02/05/2013 1402   BILIRUBINUR SMALL (A) 02/05/2013 1402   KETONESUR 15 (A) 02/05/2013 1402   PROTEINUR NEGATIVE 02/05/2013 1402   UROBILINOGEN 0.2 02/05/2013 1402   NITRITE NEGATIVE 02/05/2013 1402   LEUKOCYTESUR TRACE (A)  02/05/2013 1402   Sepsis Labs: Invalid input(s): PROCALCITONIN, [place]  Microbiology: Recent Results (from the past 240 hour(s))  Resp Panel by RT-PCR (Flu A&B, Covid) Nasopharyngeal Swab     Status: None   Collection Time: 04/01/21 12:44 PM   Specimen: Nasopharyngeal Swab; Nasopharyngeal(NP) swabs in vial transport medium  Result Value Ref Range Status   SARS Coronavirus 2 by RT PCR NEGATIVE NEGATIVE Final    Comment: (NOTE) SARS-CoV-2 target nucleic acids are NOT DETECTED.  The SARS-CoV-2 RNA is generally detectable in upper respiratory specimens during the acute phase of infection. The lowest concentration of SARS-CoV-2 viral copies this assay can detect is 138 copies/mL. A negative result does not preclude SARS-Cov-2 infection and should not be used as the sole basis for treatment or other patient management decisions. A negative result may occur with  improper specimen collection/handling, submission of specimen other than nasopharyngeal swab, presence of viral mutation(s) within the areas targeted by this assay, and inadequate number of viral copies(<138 copies/mL). A negative result must be combined with clinical observations, patient history, and epidemiological information. The expected result is Negative.  Fact Sheet for Patients:  EntrepreneurPulse.com.au  Fact Sheet for Healthcare Providers:  IncredibleEmployment.be  This test is no t yet approved or cleared by the [place] FDA and  has been authorized for detection and/or diagnosis of SARS-CoV-2 by FDA under an Emergency Use Authorization (EUA). This EUA will remain  in effect (meaning this test can be used) for the duration of the COVID-19 declaration under Section 564(b)(1) of the Act, 21 U.S.C.section 360bbb-3(b)(1), unless the authorization is terminated  or revoked sooner.       Influenza A by PCR NEGATIVE NEGATIVE Final   Influenza B by PCR NEGATIVE NEGATIVE  Final    Comment: (NOTE) The Xpert Xpress SARS-CoV-2/FLU/RSV plus assay is intended as an aid in the diagnosis of influenza from Nasopharyngeal swab specimens and should not be used as a sole basis for treatment. Nasal washings and aspirates are unacceptable for Xpert Xpress SARS-CoV-2/FLU/RSV testing.  Fact Sheet for Patients: EntrepreneurPulse.com.au  Fact Sheet for Healthcare Providers: IncredibleEmployment.be  This test is not yet approved or cleared by the [place] FDA and has been authorized for detection and/or diagnosis of SARS-CoV-2 by FDA under an Emergency Use Authorization (EUA). This EUA will remain in effect (meaning this  test can be used) for the duration of the COVID-19 declaration under Section 564(b)(1) of the Act, 21 U.S.C. section 360bbb-3(b)(1), unless the authorization is terminated or revoked.  Performed at [facility], [place] [address] [place], [place] [address]     Radiology Studies: DG Chest 2 View  Result Date: 04/01/2021 CLINICAL DATA:  Shortness of breath EXAM: CHEST - 2 VIEW COMPARISON:  Chest x-ray dated February 26, 2020 FINDINGS: Cardiac and mediastinal contours are partially obscured but appeared within normal limits. Small left greater than right pleural effusions with associated atelectasis. No focal consolidation. Diffuse bilateral interstitial opacities. No evidence of pneumothorax. IMPRESSION: Bilateral interstitial opacities and small bilateral pleural effusions, likely due to pulmonary edema. Electronically Signed   By: Yetta Glassman M.D.   On: 04/01/2021 13:10   DG Abdomen 1 View  Result Date: 04/01/2021 CLINICAL DATA:  Constipation. EXAM: ABDOMEN - 1 VIEW COMPARISON:  Abdominopelvic CT 02/25/2018, CTA 03/19/2019 FINDINGS: Small volume of stool throughout the colon, no increased stool burden to suggest constipation. No bowel dilatation or evidence of obstruction. No free intra-abdominal  air. Advanced aortic atherosclerosis. Infrarenal aortic aneurysm with aortic calcifications spanning 6.8 cm. Aneurysm measured 6.5 cm when measured on prior scout radiograph. The bones are diffusely under mineralized. IMPRESSION: 1. Small volume of colonic stool, no increased stool burden to suggest constipation. No bowel obstruction. 2. Aortic atherosclerosis. Infrarenal aortic aneurysm with aortic calcifications spanning 6.8 cm. Aneurysm measured 6.5 cm on prior scout radiograph, and 5.8 cm when measured on prior CTA. Recommend follow-up assessment with CTA, acuity based on patient's symptoms. Electronically Signed   By: Keith Rake M.D.   On: 04/01/2021 22:00       Davione Lenker T. [place]  If 7PM-7AM, please contact night-coverage [email or web address] 04/02/2021, 11:16 AM

## 2021-04-03 ENCOUNTER — Inpatient Hospital Stay (HOSPITAL_COMMUNITY): Payer: Medicare Other

## 2021-04-03 ENCOUNTER — Other Ambulatory Visit (HOSPITAL_COMMUNITY): Payer: Medicare Other

## 2021-04-03 DIAGNOSIS — I7143 Infrarenal abdominal aortic aneurysm, without rupture: Secondary | ICD-10-CM

## 2021-04-03 DIAGNOSIS — J9601 Acute respiratory failure with hypoxia: Secondary | ICD-10-CM | POA: Diagnosis not present

## 2021-04-03 DIAGNOSIS — R5381 Other malaise: Secondary | ICD-10-CM | POA: Diagnosis not present

## 2021-04-03 DIAGNOSIS — I4891 Unspecified atrial fibrillation: Secondary | ICD-10-CM | POA: Diagnosis not present

## 2021-04-03 DIAGNOSIS — J9602 Acute respiratory failure with hypercapnia: Secondary | ICD-10-CM

## 2021-04-03 LAB — RENAL FUNCTION PANEL
Albumin: 3.1 g/dL — ABNORMAL LOW (ref 3.5–5.0)
Anion gap: 7 (ref 5–15)
BUN: 14 mg/dL (ref 8–23)
CO2: 26 mmol/L (ref 22–32)
Calcium: 9 mg/dL (ref 8.9–10.3)
Chloride: 105 mmol/L (ref 98–111)
Creatinine, Ser: 0.93 mg/dL (ref 0.44–1.00)
GFR, Estimated: 58 mL/min — ABNORMAL LOW (ref >60)
Glucose, Bld: 106 mg/dL — ABNORMAL HIGH (ref 70–99)
Phosphorus: 3.3 mg/dL (ref 2.5–4.6)
Potassium: 4.2 mmol/L (ref 3.5–5.1)
Sodium: 138 mmol/L (ref 135–145)

## 2021-04-03 LAB — HEPARIN LEVEL (UNFRACTIONATED): Heparin Unfractionated: 0.36 IU/mL (ref 0.30–0.70)

## 2021-04-03 LAB — CBC
HCT: 30.6 % — ABNORMAL LOW (ref 36.0–46.0)
Hemoglobin: 8.8 g/dL — ABNORMAL LOW (ref 12.0–15.0)
MCH: 24.4 pg — ABNORMAL LOW (ref 26.0–34.0)
MCHC: 28.8 g/dL — ABNORMAL LOW (ref 30.0–36.0)
MCV: 84.8 fL (ref 80.0–100.0)
Platelets: 258 10*3/uL (ref 150–400)
RBC: 3.61 MIL/uL — ABNORMAL LOW (ref 3.87–5.11)
RDW: 19.6 % — ABNORMAL HIGH (ref 11.5–15.5)
WBC: 9.6 10*3/uL (ref 4.0–10.5)
nRBC: 0.2 % (ref 0.0–0.2)

## 2021-04-03 LAB — MAGNESIUM: Magnesium: 2.1 mg/dL (ref 1.7–2.4)

## 2021-04-03 LAB — BRAIN NATRIURETIC PEPTIDE: B Natriuretic Peptide: 83.1 pg/mL (ref 0.0–100.0)

## 2021-04-03 MED ORDER — LEVALBUTEROL HCL 0.63 MG/3ML IN NEBU
0.6300 mg | INHALATION_SOLUTION | Freq: Four times a day (QID) | RESPIRATORY_TRACT | Status: DC | PRN
  Administered 2021-04-03 – 2021-04-05 (×2): 0.63 mg via RESPIRATORY_TRACT
  Filled 2021-04-03 (×2): qty 3

## 2021-04-03 MED ORDER — RIVAROXABAN 15 MG PO TABS
15.0000 mg | ORAL_TABLET | Freq: Every day | ORAL | Status: DC
  Administered 2021-04-03 – 2021-04-04 (×2): 15 mg via ORAL
  Filled 2021-04-03 (×3): qty 1

## 2021-04-03 MED ORDER — METOPROLOL TARTRATE 50 MG PO TABS: 50.0000 mg | ORAL_TABLET | Freq: Two times a day (BID) | ORAL | Status: DC

## 2021-04-03 MED ORDER — DILTIAZEM HCL ER COATED BEADS 180 MG PO CP24
180.0000 mg | ORAL_CAPSULE | Freq: Every day | ORAL | Status: DC
  Administered 2021-04-03: 180 mg via ORAL
  Filled 2021-04-03: qty 1

## 2021-04-03 NOTE — Evaluation (Signed)
Occupational Therapy Evaluation Patient Details Name: Adriana Castro MRN: [id number] DOB: 07/24/1928 Today's Date: 04/03/2021   History of Present Illness Pt is a 87 y.o. female admitted 04/01/21 with progressive SOB. Workup for afib with RVR, possible CHF exacerbation. PMH includes COPD, afib, AAA, HTN, ovarian CA, anxiety, depression.   Clinical Impression   This 87 yo female admitted with above presents to acute OT with PLOF of being Mod I with all basic ADLs and some ADLs at [facility] or [facility] level. Currently she is setup/S-min A for basic ADLs. She will continue to benefit from acute OT with follow up OT at SNF to get back to a Mod I level and hopefully off O2 prior to going home so she will not have to contend with O2 tubing and AD for ambulation.      Recommendations for follow up therapy are one component of a multi-disciplinary discharge planning process, led by the attending physician.  Recommendations may be updated based on patient status, additional functional criteria and insurance authorization.   Follow Up Recommendations  Skilled nursing-short term rehab (<3 hours/day)    Assistance Recommended at Discharge Frequent or constant Supervision/Assistance  Functional Status Assessment  Patient has had a recent decline in their functional status and demonstrates the ability to make significant improvements in function in a reasonable and predictable amount of time.  Equipment Recommendations  Other (comment) (TBD next venue)       Precautions / Restrictions Precautions Precautions: Fall Precaution Comments: Watch SpO2 (does not wear O2 at home), minimal inspiration on inspirometer. Restrictions Weight Bearing Restrictions: No      Mobility Bed Mobility Overal bed mobility: Modified Independent                  Transfers Overall transfer level: Needs assistance Equipment used: Rolling walker (2 wheels) Transfers: Sit to/from Stand Sit to Stand: Min assist            General transfer comment: VCs for safe hand placement      Balance Overall balance assessment: Needs assistance Sitting-balance support: No upper extremity supported;Feet supported Sitting balance-Leahy Scale: Fair Sitting balance - Comments: able to doff  and donn bilateral socks, initially for left sock she bent foreward to doff which was not the safest-she then crossed her one leg over the other to do the same--much safer   Standing balance support: Bilateral upper extremity supported;During functional activity Standing balance-Leahy Scale: Poor                             ADL either performed or assessed with clinical judgement   ADL Overall ADL's : Needs assistance/impaired Eating/Feeding: Independent;Sitting   Grooming: Set up;Sitting   Upper Body Bathing: Set up;Sitting   Lower Body Bathing: Minimal assistance;Sit to/from stand   Upper Body Dressing : Set up;Sitting   Lower Body Dressing: Minimal assistance;Sit to/from stand   Toilet Transfer: Minimal assistance;Ambulation;Rolling walker (2 wheels) Toilet Transfer Details (indicate cue type and reason): Simulated from bed to door and back x3 Toileting- Clothing Manipulation and Hygiene: Minimal assistance;Sit to/from stand               Vision Patient Visual Report: No change from baseline              Pertinent Vitals/Pain Pain Assessment: No/denies pain     Hand Dominance Right   Extremity/Trunk Assessment Upper Extremity Assessment Upper Extremity Assessment: Overall WFL for tasks assessed  Communication Communication Communication: No difficulties   Cognition Arousal/Alertness: Awake/alert Behavior During Therapy: WFL for tasks assessed/performed Overall Cognitive Status: Impaired/Different from baseline                                 General Comments: WFL for majority of simple tasks, good historian, suspect some short-term memory deficits.      General Comments  On 2 liters of O2 pt dropped to 84% ambulating in room 60 feet with RW.            Home Living Family/patient expects to be discharged to:: Skilled nursing facility Living Arrangements: Alone Available Help at Discharge: Family;Available 24 hours/day Type of Home: House Home Access: Stairs to enter [employer] of Steps: 1+1 Entrance Stairs-Rails: Right Home Layout: One level     Bathroom Shower/Tub: Tub/shower unit (goes to sister's house to use walk in shower)   Bathroom Toilet: Handicapped height     Home Equipment: Conservation officer, nature (2 wheels);Rollator (4 wheels);Cane - single point   Additional Comments: Younger sister prefers pt go to SNF first (pt intitated conversation)      Prior Functioning/Environment Prior Level of Function : Needs assist             Mobility Comments: Mod indep ambulating with rollator vs. SPC; does not drive ADLs Comments: Goes to sister's home for getting into shower, or else does bird baths at sink. Does her own dressing and toileting.Has cleaning service 1x/month for household tasks. Able to prep simple meals        OT Problem List: Cardiopulmonary status limiting activity;Impaired balance (sitting and/or standing);Decreased safety awareness      OT Treatment/Interventions: Self-care/ADL training;Energy conservation;DME and/or AE instruction;Patient/family education;Balance training    OT Goals(Current goals can be found in the care plan section) Acute Rehab OT Goals Patient Stated Goal: to go to rehab then home OT Goal Formulation: With patient Time For Goal Achievement: 04/18/21 Potential to Achieve Goals: Good  OT Frequency: Min 2X/week             AM-PAC OT "6 Clicks" Daily Activity     Outcome Measure Help from another person eating meals?: None Help from another person taking care of personal grooming?: A Little Help from another person toileting, which includes using toliet, bedpan, or  urinal?: A Little Help from another person bathing (including washing, rinsing, drying)?: A Little Help from another person to put on and taking off regular upper body clothing?: A Little Help from another person to put on and taking off regular lower body clothing?: A Little 6 Click Score: 19   End of Session Equipment Utilized During Treatment: Gait belt;Rolling walker (2 wheels);Oxygen (2 liters) Nurse Communication: Mobility status  Activity Tolerance:  (limited by fatigue after ambulating 60 feet on 2 liters of O2) Patient left: in bed;with call bell/phone within reach;with bed alarm set  OT Visit Diagnosis: Unsteadiness on feet (R26.81);Other abnormalities of gait and mobility (R26.89);Muscle weakness (generalized) (M62.81)                Time: 9892-1194 OT Time Calculation (min): 30 min Charges:  OT General Charges $OT Visit: 1 Visit OT Evaluation $OT Eval Moderate Complexity: 1 Mod OT Treatments $Self Care/Home Management : 8-22 mins  [address], OTR/L Acute [employer] Pager [phone] Office [phone]    Almon Register 04/03/2021, 1:49 PM

## 2021-04-03 NOTE — Progress Notes (Signed)
PROGRESS NOTE  Adriana Castro IWL:[id number] DOB: 1928/07/16   PCP: Nicholos Johns, MD  Patient is from: Home. Lives alone. Uses cane at times  DOA: 04/01/2021 LOS: 1  Chief complaints:  Chief Complaint  Patient presents with   Shortness of Breath     Brief Narrative / Interim history: 87 year old F with PMH of COPD, A. fib off AC, AAA, HTN, ovarian cancer, anxiety and depression presenting with progressive SOB and DOE for 3 weeks, and admitted for A. fib with RVR and possible CHF exacerbation.  Patient was taken off Xarelto by PCP about 3 weeks ago.  She denies melena, hematochezia or fall.  Concerned about noncompliance with the other cardiac medications on admission although she denies.  Started on Cardizem drip, IV heparin and IV Lasix.   Patient is a slowly improving but continues to require 2 L by nasal cannula.  She is also physically deconditioned.  Therapy recommended SNF.  Subjective: Seen and examined earlier this morning.  No major events overnight of this morning.  In A. fib with HR in 90s.  She dysphagia she desaturated to 84% with ambulation on 2 L.  She lives alone and physically deconditioned and needed home oxygen if she cannot be liberated.  Therapy recommending SNF due to this complexity  Objective: Vitals:   04/03/21 0836 04/03/21 0837 04/03/21 1009 04/03/21 1300  BP:   117/69 (!) 146/64  Pulse:   79   Resp:   20   Temp:   97.7 F (36.5 C) 97.8 F (36.6 C)  TempSrc:   Oral Oral  SpO2: 94% 96% 96% 93%  Weight:      Height:        Intake/Output Summary (Last 24 hours) at 04/03/2021 1429 Last data filed at 04/02/2021 1800 Gross per 24 hour  Intake 240 ml  Output --  Net 240 ml   Filed Weights   04/01/21 1920 04/02/21 0508 04/03/21 0400  Weight: 64 kg 60.3 kg 61.1 kg    Examination:  GENERAL: No apparent distress.  Nontoxic. HEENT: MMM.  Vision and hearing grossly intact.  NECK: Supple.  No apparent JVD.  RESP: 93% on 2 L.  No IWOB.  Diminished  aeration bilaterally.  Less than 250 cc on IS CVS: Irregular rhythm in 90s.  Heart sounds normal.  ABD/GI/GU: BS+. Abd soft, NTND.  MSK/EXT:  Moves extremities. No apparent deformity. No edema.  SKIN: no apparent skin lesion or wound NEURO: Awake and alert. Oriented appropriately.  No apparent focal neuro deficit. PSYCH: Calm. Normal affect.   Procedures:  None  Microbiology summarized: [id number]-14 and influenza PCR nonreactive.  Assessment & Plan: A. fib with RVR-RVR improved.  HR  90-100.  Concern about noncompliance.  Dig level was undetectable.  Taken off Xarelto by PCP about 3 weeks ago but unclear why.  May be due to anemia but patient denies melena or hematochezia.  Denies fall. TSH within normal.  TTE with LVEF of 60 to 65%, indeterminate DD, severe LAE, moderate MVR, RVSP of 47.  -Wean off Cardizem drip as able -Increase p.o. Cardizem CD to 180 mg daily -Continue home digoxin 0.125 mg MWF -Transition to home Xarelto -Diuretics as below.  Acute diastolic CHF: TTE as above.  Appears euvolemic on exam but CXR with bilateral interstitial opacities and small bilateral pleural effusion raising concern for pulmonary edema.  BNP 24>> 83.  On p.o. Lasix 20 mg daily as needed at home.  Seems like she received only 1 dose of Lasix  the night of admission.  I&O incomplete. -Resume IV Lasix 20 mg daily -Monitor fluid status, renal functions and electrolytes -Sodium and fluid restrictions, and heart healthy diet.  Acute respiratory failure with hypoxia: Desaturated to 84% with ambulation on 2 L.  Suspect this to be due to A. fib and CHF.  Does not seem to have COPD exacerbation.  Could have some atelectasis.  She has poor inspiratory effort. -Wean oxygen as able.  Minimum oxygen to keep saturation above 88%. -Manage A. fib and CHF as above -Incentive spirometry/OOB/PT/OT  Chronic COPD: Doubt exacerbation. -Continue scheduled and as needed Xopenex -Continue Pulmicort -Hold off steroid for  now  Iron deficiency anemia: Iron saturation 10%.  Ferritin low normal.  TIBC high normal.  H&H stable. Recent Labs    04/01/21 1222 04/02/21 0352 04/02/21 1305 04/03/21 0658  HGB 9.4* 8.3* 8.3* 8.8*  -IV ferric gluconate 250 mg x 1 -Monitor CBC  Infrarenal AAA-slightly progressed from 5.8 cm on CTA in 2020 to 6.1 cm on [place].  Slight progression.  Seen by Dr. Scot Dock with vascular surgery.  Per Dr. Nicole Cella note from 2020, "she does not have an adequate neck for an endovascular aneurysm repair.  Given that she would require an open repair or a fenestrated graft I would not recommend considering addressing the aneurysm unless it enlarges significantly in size and she feels quite strongly in agreement with this".  -Optimize blood pressure. -Outpatient follow-up with vascular surgery  History of stage III ovarian cancer-in remission s/p 4 cycles neoadjuvant chemotherapy 09/23/12 exploratory laparotomy, TAH/BSO, omentectomy -Followed at [facility].  Anxiety and depression: Stable.  Not on medication at home  Generalized weakness/physical deconditioning-patient lives alone.  Not requiring oxygen.  Not safe to return home with oxygen and walker. -Therapy recommending SNF  Goal of care counseling: Admitted as full code.  However, after discussion about code status, patient stated that she is DNR/DNI.  Patient's sister at bedside, and in agreement.  Body mass index is 21.1 kg/m.         DVT prophylaxis:  On IV heparin for A. fib  Code Status: DNR/DNI Family Communication: Updated patient's sister at bedside. Level of care: Progressive Status is: Inpatient  Remains inpatient appropriate because: Hemodynamically unstable requiring IV Cardizem, hypoxic respiratory failure requiring supplemental oxygen, and safe disposition       Consultants:  None   Sch Meds:  Scheduled Meds:  [START ON 04/04/2021] digoxin  0.125 mg Oral Q M,W,F   diltiazem  180 mg Oral Daily   furosemide  20  mg Intravenous Daily   levalbuterol  0.63 mg Inhalation TID   montelukast  10 mg Oral QPM   umeclidinium bromide  2 puff Inhalation Daily   Continuous Infusions:  diltiazem (CARDIZEM) infusion 10 mg/hr (04/03/21 0514)   heparin 1,000 Units/hr (04/02/21 2218)   PRN Meds:.acetaminophen **OR** acetaminophen, nitroGLYCERIN  Antimicrobials: Anti-infectives (From admission, onward)    None        I have personally reviewed the following labs and images: CBC: Recent Labs  Lab 04/01/21 1222 04/02/21 0352 04/02/21 1305 04/03/21 0658  WBC 11.0* 10.5 9.1 9.6  NEUTROABS 9.3*  --   --   --   HGB 9.4* 8.3* 8.3* 8.8*  HCT 32.8* 29.3* 29.4* 30.6*  MCV 84.3 83.2 84.5 84.8  PLT 249 240 214 258   BMP &GFR Recent Labs  Lab 04/01/21 1222 04/02/21 0352 04/03/21 0658  NA 138 139 138  K 4.4 4.0 4.2  CL 105 102  105  CO2 23 27 26   GLUCOSE 103* 112* 106*  BUN 15 18 14   CREATININE 0.92 1.07* 0.93  CALCIUM 9.1 8.8* 9.0  MG  --  2.0 2.1  PHOS  --   --  3.3   Estimated Creatinine Clearance: 38.8 mL/min (by C-G formula based on SCr of 0.93 mg/dL). Liver & Pancreas: Recent Labs  Lab 04/01/21 1222 04/03/21 0658  AST 15  --   ALT 19  --   ALKPHOS 78  --   BILITOT 1.3*  --   PROT 6.1*  --   ALBUMIN 3.4* 3.1*   No results for input(s): LIPASE, AMYLASE in the last 168 hours. No results for input(s): AMMONIA in the last 168 hours. Diabetic: No results for input(s): HGBA1C in the last 72 hours. Recent Labs  Lab 04/01/21 2353  GLUCAP 103*   Cardiac Enzymes: No results for input(s): CKTOTAL, CKMB, CKMBINDEX, TROPONINI in the last 168 hours. No results for input(s): PROBNP in the last 8760 hours. Coagulation Profile: No results for input(s): INR, PROTIME in the last 168 hours. Thyroid Function Tests: Recent Labs    04/02/21 0352  TSH 1.284   Lipid Profile: No results for input(s): CHOL, HDL, LDLCALC, TRIG, CHOLHDL, LDLDIRECT in the last 72 hours. Anemia Panel: Recent Labs     04/02/21 0352  VITAMINB12 546  FOLATE 12.6  FERRITIN 30  TIBC 319  IRON 33  RETICCTPCT 2.9   Urine analysis:    Component Value Date/Time   COLORURINE YELLOW 02/05/2013 1402   APPEARANCEUR CLEAR 02/05/2013 1402   LABSPEC 1.021 02/05/2013 1402   PHURINE 5.0 02/05/2013 1402   GLUCOSEU NEGATIVE 02/05/2013 1402   HGBUR NEGATIVE 02/05/2013 1402   BILIRUBINUR SMALL (A) 02/05/2013 1402   KETONESUR 15 (A) 02/05/2013 1402   PROTEINUR NEGATIVE 02/05/2013 1402   UROBILINOGEN 0.2 02/05/2013 1402   NITRITE NEGATIVE 02/05/2013 1402   LEUKOCYTESUR TRACE (A) 02/05/2013 1402   Sepsis Labs: Invalid input(s): PROCALCITONIN, [place]  Microbiology: Recent Results (from the past 240 hour(s))  Resp Panel by RT-PCR (Flu A&B, Covid) Nasopharyngeal Swab     Status: None   Collection Time: 04/01/21 12:44 PM   Specimen: Nasopharyngeal Swab; Nasopharyngeal(NP) swabs in vial transport medium  Result Value Ref Range Status   SARS Coronavirus 2 by RT PCR NEGATIVE NEGATIVE Final    Comment: (NOTE) SARS-CoV-2 target nucleic acids are NOT DETECTED.  The SARS-CoV-2 RNA is generally detectable in upper respiratory specimens during the acute phase of infection. The lowest concentration of SARS-CoV-2 viral copies this assay can detect is 138 copies/mL. A negative result does not preclude SARS-Cov-2 infection and should not be used as the sole basis for treatment or other patient management decisions. A negative result may occur with  improper specimen collection/handling, submission of specimen other than nasopharyngeal swab, presence of viral mutation(s) within the areas targeted by this assay, and inadequate number of viral copies(<138 copies/mL). A negative result must be combined with clinical observations, patient history, and epidemiological information. The expected result is Negative.  Fact Sheet for Patients:  EntrepreneurPulse.com.au  Fact Sheet for Healthcare  Providers:  IncredibleEmployment.be  This test is no t yet approved or cleared by the [place] FDA and  has been authorized for detection and/or diagnosis of SARS-CoV-2 by FDA under an Emergency Use Authorization (EUA). This EUA will remain  in effect (meaning this test can be used) for the duration of the COVID-19 declaration under Section 564(b)(1) of the Act, 21 U.S.C.section 360bbb-3(b)(1), unless  the authorization is terminated  or revoked sooner.       Influenza A by PCR NEGATIVE NEGATIVE Final   Influenza B by PCR NEGATIVE NEGATIVE Final    Comment: (NOTE) The Xpert Xpress SARS-CoV-2/FLU/RSV plus assay is intended as an aid in the diagnosis of influenza from Nasopharyngeal swab specimens and should not be used as a sole basis for treatment. Nasal washings and aspirates are unacceptable for Xpert Xpress SARS-CoV-2/FLU/RSV testing.  Fact Sheet for Patients: EntrepreneurPulse.com.au  Fact Sheet for Healthcare Providers: IncredibleEmployment.be  This test is not yet approved or cleared by the [place] FDA and has been authorized for detection and/or diagnosis of SARS-CoV-2 by FDA under an Emergency Use Authorization (EUA). This EUA will remain in effect (meaning this test can be used) for the duration of the COVID-19 declaration under Section 564(b)(1) of the Act, 21 U.S.C. section 360bbb-3(b)(1), unless the authorization is terminated or revoked.  Performed at [facility], [place] [address] [place], [place] [address]     Radiology Studies: US AORTA  Result Date: 04/03/2021 CLINICAL DATA:  Aortic aneurysm EXAM: ULTRASOUND OF ABDOMINAL AORTA TECHNIQUE: Ultrasound examination of the abdominal aorta and proximal common iliac arteries was performed to evaluate for aneurysm. Additional color and Doppler images of the distal aorta were obtained to document patency. COMPARISON:  CTA done on 03/19/2019  FINDINGS: Abdominal aortic measurements as follows: Proximal:  2.7 cm Mid:  6.1 cm Distal:  1.6 cm Patent: Yes, peak systolic velocity is 78 cm/s Right common iliac artery: 0.9 cm Left common iliac artery: 1.2 cm According to the note by the technologist, examination was technically difficult. IMPRESSION: There is infrarenal aortic aneurysm measuring 6.1 cm in maximum diameter. Electronically Signed   By: Elmer Picker M.D.   On: 04/03/2021 13:02       Andrew Blasius T. [place]  If 7PM-7AM, please contact night-coverage [email or web address] 04/03/2021, 2:29 PM

## 2021-04-03 NOTE — Progress Notes (Signed)
ANTICOAGULATION CONSULT NOTE - Initial Consult  Pharmacy Consult for heparin Indication: atrial fibrillation  Allergies  Allergen Reactions   Albuterol Nausea And Vomiting    Patient Measurements: Height: 5\' 7"  (170.2 cm) Weight: 61.1 kg (134 lb 11.2 oz) IBW/kg (Calculated) : 61.6  Vital Signs: Temp: 97.9 F (36.6 C) (10/30 0706) Temp Source: Oral (10/30 0706) BP: 139/63 (10/30 0040) Pulse Rate: 98 (10/30 0400)  Labs: Recent Labs    04/01/21 1222 04/01/21 2040 04/02/21 0352 04/02/21 1305 04/02/21 2033 04/03/21 0658  HGB 9.4*  --  8.3* 8.3*  --  8.8*  HCT 32.8*  --  29.3* 29.4*  --  30.6*  PLT 249  --  240 214  --  258  APTT  --   --   --  70*  --   --   HEPARINUNFRC  --   --   --  0.33 0.41 0.36  CREATININE 0.92  --  1.07*  --   --  0.93  TROPONINIHS 14 14  --   --   --   --      Estimated Creatinine Clearance: 38.8 mL/min (by C-G formula based on SCr of 0.93 mg/dL).   Medical History: Past Medical History:  Diagnosis Date   Abdominal aortic aneurysm without rupture 01/06/2016   Adrenal adenoma 09/10/2013   Overview:  Left, unchanged per CT 09/10/2013  Formatting of this note might be different from the original. Left, unchanged per CT 09/10/2013   Aneurysm ([place]) 09/10/2013   Formatting of this note might be different from the original. Infrarenal aortic aneurysm 4.7 cm in greatest anteroposterior dimensions per CT ( 09/10/2013 )   Anxiety    Anxiety    Arthritis    Atrial fibrillation ([place]) 09/10/2013   Formatting of this note might be different from the original. f/b Dr. Geraldo Pitter ( [place], [place] ) Xarelto 15mg  daily (decreased from 20mg  daily 4/25) Diltiazem 120 BID Amioderone 200mg  daily added 4/25   Bilateral carotid artery stenosis 01/06/2016   Chest pain 02/05/2013   Closed bicondylar fracture of distal end of left humerus    COPD (chronic obstructive pulmonary disease) ([place])    Depression 09/10/2013   Dyslipidemia    Dysrhythmia    ATRIAL FIBRILATION   Encounter for  antineoplastic chemotherapy 11/09/2013   GERD (gastroesophageal reflux disease)    Heart murmur    History of open reduction and internal fixation (ORIF) procedure 11/01/2016   HTN (hypertension) 09/10/2013   Hyperlipidemia 09/10/2013   Hypertension    Hypoxia 02/05/2013   Irregular heart beat    Leaky heart valve    Malignant neoplasm of ovary ([place]) 09/28/2013   Overview:  S/p 4 cycles neoadjuvant chemotherapy 09/23/12 exploratory laparotomy, TAH/BSO, omentectomy   Malignant ovarian neoplasm ([place]) 09/28/2013   Formatting of this note might be different from the original. S/p 4 cycles neoadjuvant chemotherapy 09/23/12 exploratory laparotomy, TAH/BSO, omentectomy   Mood disorder ([place])    Ovarian cancer ([place])    Shortness of breath     Assessment: 87yo female c/o gradually worsening SOB over last three weeks, found to be in Afib with RVR, to begin heparin; of note pt had been on anticoagulation for Afib but was recently taken off by MD PTA.  No bleeding reported. CBC stable  Heparin level 0.36 is therapeutic on 1000 units/hr.  Goal of Therapy:  Heparin level 0.3-0.7 units/ml Monitor platelets by anticoagulation protocol: Yes   Plan:  Continue heparin at 1000 u/h  Monitor daily HL,  CBC/plt Monitor for signs/symptoms of bleeding  F/u restart DOAC   Thank you for allowing pharmacy to participate in this patient's care.  Thank you for allowing pharmacy to participate in this patient's care.  Reatha Harps, PharmD PGY1 Pharmacy Resident 04/03/2021 9:39 AM Check [email or web address] for unit specific pharmacy number

## 2021-04-03 NOTE — Progress Notes (Signed)
ANTICOAGULATION CONSULT NOTE - Initial Consult  Pharmacy Consult for heparin>> rivaroxaban  Indication: atrial fibrillation  Allergies  Allergen Reactions   Albuterol Nausea And Vomiting    Patient Measurements: Height: 5\' 7"  (170.2 cm) Weight: 61.1 kg (134 lb 11.2 oz) IBW/kg (Calculated) : 61.6  Vital Signs: Temp: 98.4 F (36.9 C) (10/30 1501) Temp Source: Oral (10/30 1501) BP: 132/69 (10/30 1501) Pulse Rate: 85 (10/30 1501)  Labs: Recent Labs    04/01/21 1222 04/01/21 2040 04/02/21 0352 04/02/21 1305 04/02/21 2033 04/03/21 0658  HGB 9.4*  --  8.3* 8.3*  --  8.8*  HCT 32.8*  --  29.3* 29.4*  --  30.6*  PLT 249  --  240 214  --  258  APTT  --   --   --  70*  --   --   HEPARINUNFRC  --   --   --  0.33 0.41 0.36  CREATININE 0.92  --  1.07*  --   --  0.93  TROPONINIHS 14 14  --   --   --   --      Estimated Creatinine Clearance: 38.8 mL/min (by C-G formula based on SCr of 0.93 mg/dL).   Medical History: Past Medical History:  Diagnosis Date   Abdominal aortic aneurysm without rupture 01/06/2016   Adrenal adenoma 09/10/2013   Overview:  Left, unchanged per CT 09/10/2013  Formatting of this note might be different from the original. Left, unchanged per CT 09/10/2013   Aneurysm ([place]) 09/10/2013   Formatting of this note might be different from the original. Infrarenal aortic aneurysm 4.7 cm in greatest anteroposterior dimensions per CT ( 09/10/2013 )   Anxiety    Anxiety    Arthritis    Atrial fibrillation ([place]) 09/10/2013   Formatting of this note might be different from the original. f/b Dr. Geraldo Pitter ( [place], [place] ) Xarelto 15mg  daily (decreased from 20mg  daily 4/25) Diltiazem 120 BID Amioderone 200mg  daily added 4/25   Bilateral carotid artery stenosis 01/06/2016   Chest pain 02/05/2013   Closed bicondylar fracture of distal end of left humerus    COPD (chronic obstructive pulmonary disease) ([place])    Depression 09/10/2013   Dyslipidemia    Dysrhythmia    ATRIAL FIBRILATION    Encounter for antineoplastic chemotherapy 11/09/2013   GERD (gastroesophageal reflux disease)    Heart murmur    History of open reduction and internal fixation (ORIF) procedure 11/01/2016   HTN (hypertension) 09/10/2013   Hyperlipidemia 09/10/2013   Hypertension    Hypoxia 02/05/2013   Irregular heart beat    Leaky heart valve    Malignant neoplasm of ovary ([place]) 09/28/2013   Overview:  S/p 4 cycles neoadjuvant chemotherapy 09/23/12 exploratory laparotomy, TAH/BSO, omentectomy   Malignant ovarian neoplasm ([place]) 09/28/2013   Formatting of this note might be different from the original. S/p 4 cycles neoadjuvant chemotherapy 09/23/12 exploratory laparotomy, TAH/BSO, omentectomy   Mood disorder ([place])    Ovarian cancer ([place])    Shortness of breath     Assessment: 87yo female c/o gradually worsening SOB over last three weeks, found to be in Afib with RVR, to begin heparin; of note pt had been on rivaroxaban for Afib but it was discontinued by PCP for unclear reasons 3 weeks PTA.  Pharmacy consulted to restart rivaroxaban.   ClCr ~38 ml/min.   Goal of Therapy:  Heparin level 0.3-0.7 units/ml Monitor platelets by anticoagulation protocol: Yes   Plan:  Stop heparin at 1700 Start rivaroxaban  15mg  daily with [place]  Monitor for signs/symptoms of bleeding   Thank you for allowing pharmacy to participate in this patient's care  Benetta Spar, PharmD, BCPS, [facility] Clinical Pharmacist  Please check AMION for all [place] phone numbers After 10:00 PM, call [place] [phone]

## 2021-04-03 NOTE — Progress Notes (Signed)
Walked to bathroom with no O2, was satting 81%. Placed back on 2 LNC and sats to 92%.

## 2021-04-04 DIAGNOSIS — I4891 Unspecified atrial fibrillation: Secondary | ICD-10-CM | POA: Diagnosis not present

## 2021-04-04 DIAGNOSIS — I7143 Infrarenal abdominal aortic aneurysm, without rupture: Secondary | ICD-10-CM | POA: Diagnosis not present

## 2021-04-04 DIAGNOSIS — J9601 Acute respiratory failure with hypoxia: Secondary | ICD-10-CM | POA: Diagnosis not present

## 2021-04-04 DIAGNOSIS — R5381 Other malaise: Secondary | ICD-10-CM | POA: Diagnosis not present

## 2021-04-04 LAB — RENAL FUNCTION PANEL
Albumin: 2.9 g/dL — ABNORMAL LOW (ref 3.5–5.0)
Anion gap: 6 (ref 5–15)
BUN: 12 mg/dL (ref 8–23)
CO2: 29 mmol/L (ref 22–32)
Calcium: 9.2 mg/dL (ref 8.9–10.3)
Chloride: 103 mmol/L (ref 98–111)
Creatinine, Ser: 0.98 mg/dL (ref 0.44–1.00)
GFR, Estimated: 55 mL/min — ABNORMAL LOW (ref >60)
Glucose, Bld: 100 mg/dL — ABNORMAL HIGH (ref 70–99)
Phosphorus: 3.6 mg/dL (ref 2.5–4.6)
Potassium: 3.9 mmol/L (ref 3.5–5.1)
Sodium: 138 mmol/L (ref 135–145)

## 2021-04-04 LAB — CBC
HCT: 28.2 % — ABNORMAL LOW (ref 36.0–46.0)
Hemoglobin: 8.2 g/dL — ABNORMAL LOW (ref 12.0–15.0)
MCH: 24.3 pg — ABNORMAL LOW (ref 26.0–34.0)
MCHC: 29.1 g/dL — ABNORMAL LOW (ref 30.0–36.0)
MCV: 83.7 fL (ref 80.0–100.0)
Platelets: 228 10*3/uL (ref 150–400)
RBC: 3.37 MIL/uL — ABNORMAL LOW (ref 3.87–5.11)
RDW: 19.6 % — ABNORMAL HIGH (ref 11.5–15.5)
WBC: 7.8 10*3/uL (ref 4.0–10.5)
nRBC: 0.3 % — ABNORMAL HIGH (ref 0.0–0.2)

## 2021-04-04 LAB — HEPARIN LEVEL (UNFRACTIONATED): Heparin Unfractionated: >1.1 IU/mL — ABNORMAL HIGH (ref 0.30–0.70)

## 2021-04-04 LAB — MAGNESIUM: Magnesium: 1.9 mg/dL (ref 1.7–2.4)

## 2021-04-04 MED ORDER — DILTIAZEM HCL ER COATED BEADS 240 MG PO CP24
240.0000 mg | ORAL_CAPSULE | Freq: Every day | ORAL | Status: DC
  Administered 2021-04-04 – 2021-04-06 (×3): 240 mg via ORAL
  Filled 2021-04-04 (×3): qty 1

## 2021-04-04 MED ORDER — REVEFENACIN 175 MCG/3ML IN SOLN
175.0000 ug | Freq: Every day | RESPIRATORY_TRACT | Status: DC
  Filled 2021-04-04: qty 3

## 2021-04-04 MED ORDER — REVEFENACIN 175 MCG/3ML IN SOLN
175.0000 ug | Freq: Every day | RESPIRATORY_TRACT | Status: DC
  Administered 2021-04-05 – 2021-04-06 (×2): 175 ug via RESPIRATORY_TRACT
  Filled 2021-04-04 (×2): qty 3

## 2021-04-04 MED ORDER — SENNOSIDES-DOCUSATE SODIUM 8.6-50 MG PO TABS
1.0000 | ORAL_TABLET | Freq: Two times a day (BID) | ORAL | Status: DC | PRN
  Administered 2021-04-06: 1 via ORAL
  Filled 2021-04-04: qty 1

## 2021-04-04 MED ORDER — POLYETHYLENE GLYCOL 3350 17 G PO PACK: 17.0000 g | PACK | Freq: Two times a day (BID) | ORAL | Status: DC | PRN

## 2021-04-04 MED ORDER — DILTIAZEM HCL 30 MG PO TABS: 30.0000 mg | ORAL_TABLET | Freq: Four times a day (QID) | ORAL | Status: DC | PRN

## 2021-04-04 NOTE — Progress Notes (Signed)
Heart Failure Nurse Navigator Progress Note  Attempted to interview pt for HV TOC readiness. Pt declined interview, does not want another appointment. More concerned with "constipation".   ECHO: EF 60-65%, mod MR.   Pricilla Holm, MSN, RN Heart Failure Nurse Navigator [phone]

## 2021-04-04 NOTE — Progress Notes (Addendum)
Physical Therapy Treatment Patient Details Name: Adriana Castro MRN: [id number] DOB: [birth year] Today's Date: 04/04/2021   History of Present Illness Pt is a 87 y.o. female admitted 04/01/21 with progressive SOB. Workup for afib with RVR, possible CHF exacerbation. PMH includes COPD, afib, AAA, HTN, ovarian CA, anxiety, depression.    PT Comments    Pt was seen for mobility on RW with assistance to monitor her sats and HR.  Pt was in room on 1L and had to increase to 2L for gait, sats down to 69% and HR to 140.  Sitting rest employed twice, and O2 had to be set to 3L for tolerance of mobility.  Pt did report poor sleep but was not symptomatic with these issues of sats and HR.  Follow along with her to increase her awareness of vitals, and continue to instruct pursed lip breathing for management of sats.  Encouraged her to sit OOB to eat despite her reluctance, and will work on endurance with all time up and moving to improve mobility tolerances and vitals to go home.     Recommendations for follow up therapy are one component of a multi-disciplinary discharge planning process, led by the attending physician.  Recommendations may be updated based on patient status, additional functional criteria and insurance authorization.  Follow Up Recommendations  Skilled nursing-short term rehab (<3 hours/day)     Assistance Recommended at Discharge Frequent or constant Supervision/Assistance  Equipment Recommendations  None recommended by PT    Recommendations for Other Services       Precautions / Restrictions Precautions Precautions: Fall Precaution Comments: watch HR and sats Restrictions Weight Bearing Restrictions: No     Mobility  Bed Mobility Overal bed mobility: Needs Assistance Bed Mobility: Sidelying to Sit   Sidelying to sit: Supervision       General bed mobility comments: sat up on side of bed with pt requiring some cues for lines    Transfers Overall transfer level:  Needs assistance Equipment used: Rolling walker (2 wheels);1 person hand held assist Transfers: Sit to/from Stand Sit to Stand: Min guard;Min assist           General transfer comment: cued hand placement and sequence esp with lines    Ambulation/Gait Ambulation/Gait assistance: Min guard Gait Distance (Feet): 100 Feet (50+45+5) Assistive device: Rolling walker (2 wheels) Gait Pattern/deviations: Step-through pattern;Decreased stride length;Shuffle;Trunk flexed Gait velocity: Decreased Gait velocity interpretation: <1.31 ft/sec, indicative of household ambulator General Gait Details: pt was limited by her vitals reacting to movement but had O2 reduced and required increase for mobility   Stairs             Wheelchair Mobility    Modified Rankin (Stroke Patients Only)       Balance Overall balance assessment: Needs assistance Sitting-balance support: Feet supported Sitting balance-Leahy Scale: Fair     Standing balance support: Bilateral upper extremity supported;During functional activity Standing balance-Leahy Scale: Poor                              Cognition Arousal/Alertness: Awake/alert Behavior During Therapy: WFL for tasks assessed/performed Overall Cognitive Status: Impaired/Different from baseline Area of Impairment: Problem solving;Awareness;Safety/judgement;Following commands;Attention;Orientation                 Orientation Level: Situation Current Attention Level: Selective   Following Commands: Follows one step commands with increased time;Follows one step commands inconsistently Safety/Judgement: Decreased awareness of deficits;Decreased awareness of safety Awareness:  Intellectual Problem Solving: Slow processing;Requires verbal cues General Comments: pt requires repetitive instructions for problem solving desaturating and HR elevation, reluctant to sit and rest        Exercises      General Comments General  comments (skin integrity, edema, etc.): on 1L O2 when PT arrived, 91% and dropped to 69% quickly to walk 60'.  Increased to 3L to maintain sats finally, left on 2L      Pertinent Vitals/Pain Pain Assessment: No/denies pain    Home Living                          Prior Function            PT Goals (current goals can now be found in the care plan section) Acute Rehab PT Goals Patient Stated Goal: "I'm going home" Progress towards PT goals: Progressing toward goals    Frequency    Min 3X/week      PT Plan Discharge plan needs to be updated    Co-evaluation              AM-PAC PT "6 Clicks" Mobility   Outcome Measure  Help needed turning from your back to your side while in a flat bed without using bedrails?: None Help needed moving from lying on your back to sitting on the side of a flat bed without using bedrails?: A Little Help needed moving to and from a bed to a chair (including a wheelchair)?: A Little Help needed standing up from a chair using your arms (e.g., wheelchair or bedside chair)?: A Little Help needed to walk in hospital room?: A Little Help needed climbing 3-5 steps with a railing? : Total 6 Click Score: 17    End of Session Equipment Utilized During Treatment: Gait belt;Oxygen Activity Tolerance: Treatment limited secondary to medical complications (Comment) Patient left: in chair;with call bell/phone within reach;with chair alarm set Nurse Communication: Mobility status PT Visit Diagnosis: Muscle weakness (generalized) (M62.81);Other abnormalities of gait and mobility (R26.89)     Time: 2426-8341 PT Time Calculation (min) (ACUTE ONLY): 35 min  Charges:  $Gait Training: 8-22 mins $Therapeutic Activity: 8-22 mins         Ramond Dial 04/04/2021, 3:20 PM  Mee Hives, PT PhD Acute Rehab Dept. Number: [place] [facility] [place]

## 2021-04-04 NOTE — TOC Progression Note (Signed)
Transition of Care [facility]) - Progression Note    Patient Details  Name: Adriana Castro MRN: [id number] Date of Birth: Oct 10, 1928  Transition of Care [facility]) CM/SW Contact  Wandra Feinstein [place], [place] Phone Number: 04/04/2021, 1:58 PM  Clinical Narrative:   Per progression rounds, anticipated change in therapy rec from [facility] to SNF. Pt reportedly requesting [facility] if SNF needed. Per PT note today, recommendation remains for [facility], pt ambulated 100' min guard assist. SW available if SNF needed.  Wandra Feinstein, MSW, LCSW [phone] (coverage)           Expected Discharge Plan and Services                                                 Social Determinants of Health (SDOH) Interventions    Readmission Risk Interventions No flowsheet data found.

## 2021-04-04 NOTE — Progress Notes (Signed)
PROGRESS NOTE  Adriana Castro MWU:[id number] DOB: 12/18/1928   PCP: Nicholos Johns, MD  Patient is from: Home. Lives alone. Uses cane at times  DOA: 04/01/2021 LOS: 2  Chief complaints:  Chief Complaint  Patient presents with   Shortness of Breath     Brief Narrative / Interim history: 87 year old F with PMH of COPD, A. fib off AC, AAA, HTN, ovarian cancer, anxiety and depression presenting with progressive SOB and DOE for 3 weeks, and admitted for A. fib with RVR and possible CHF exacerbation.  Patient was taken off Xarelto by PCP about 3 weeks ago.  She denies melena, hematochezia or fall.  Concerned about noncompliance with the other cardiac medications on admission although she denies.  Started on Cardizem drip, IV heparin and IV Lasix.   Patient is a slowly improving but continues to require 2 L by nasal cannula.  She is also physically deconditioned.    Subjective: Seen and examined earlier this morning.  A lot of complaint this morning.  She feels "awful".  She says no one is helping her with breakfast.  She is upset about the oxygen tubing at bedside.  She states her breathing is about the same.  She denies chest pain or palpitation.  Wants something for constipation.  Objective: Vitals:   04/04/21 0000 04/04/21 0400 04/04/21 0720 04/04/21 1128  BP: (!) 117/56 134/69 126/64 120/76  Pulse: 83 (!) 106 96 98  Resp: 16 19 18 20   Temp: (!) 97.4 F (36.3 C) 97.6 F (36.4 C) 97.6 F (36.4 C) 98.4 F (36.9 C)  TempSrc: Oral Oral Oral Oral  SpO2: 93% 94% 94% 90%  Weight:  63.5 kg    Height:        Intake/Output Summary (Last 24 hours) at 04/04/2021 1425 Last data filed at 04/04/2021 1133 Gross per 24 hour  Intake 600 ml  Output 1350 ml  Net -750 ml   Filed Weights   04/02/21 0508 04/03/21 0400 04/04/21 0400  Weight: 60.3 kg 61.1 kg 63.5 kg    Examination:  GENERAL: No apparent distress.  Nontoxic. HEENT: MMM.  Vision and hearing grossly intact.  NECK: Supple.  No  apparent JVD.  RESP: 94% on 2 L at rest.  No IWOB.  Diminished aeration.  Poor respiratory effort. CVS: Irregular rhythm.  90 to 100 bpm.  Heart sounds normal.  ABD/GI/GU: BS+. Abd soft, NTND.  MSK/EXT:  Moves extremities. No apparent deformity. No edema.  SKIN: no apparent skin lesion or wound NEURO: Awake and alert. Oriented appropriately.  No apparent focal neuro deficit. PSYCH: Calm. Normal affect.   Procedures:  None  Microbiology summarized: [id number] and influenza PCR nonreactive.  Assessment & Plan: A. fib with RVR-RVR improved.  HR  90-100.  Concern about noncompliance.  Dig level was undetectable.  Taken off Xarelto by PCP about 3 weeks ago but unclear why.  May be due to anemia and fall risk.  Denies fall, melena or hematochezia but she lives alone. TSH within normal.  TTE with LVEF of 60 to 65%, indeterminate DD, sev LAE, mod MVR, RVSP of 47.  -Wean off Cardizem drip as able -Increase p.o. Cardizem CD to from 180 mg to 240 mg daily -P.o. Cardizem 30 mg every 6 hours as needed for sustained HR> 120 with hold parameters -Continue home digoxin 0.125 mg MWF -Transitioned to home Xarelto  -Diuretics as below.  Acute diastolic CHF: TTE as above.  Appears euvolemic on exam but CXR with bilateral interstitial opacities and  small bilateral pleural effusion raising concern for pulmonary edema.  BNP 24>> 83.  On p.o. Lasix 20 mg daily as needed at home.  IV Lasix resumed.  About 650 cc UOP/24 hours charted. -Continue IV Lasix 20 mg daily -Monitor fluid status, renal functions and electrolytes -Sodium and fluid restrictions, and heart healthy diet.  Acute respiratory failure with hypoxia: Desaturated to 69% with ambulation on 2 L.  Suspect this to be due to A. fib and CHF.  Could have some atelectasis.  She has poor inspiratory effort, about 250 cc on IS -Wean oxygen as able.  Minimum oxygen to keep saturation above 88%. -Manage A. fib and CHF as above -Incentive  spirometry/OOB/PT/OT  Chronic COPD: Doubt exacerbation. -Change Incruse Ellipta to Yupelri-patient has poor inspiratory effort to benefit from inhalers -Continue Pulmicort, as needed Xopenex -Hold off steroid for now  Iron deficiency anemia: Iron saturation 10%.  Ferritin low normal.  TIBC high normal.  H&H stable. Recent Labs    04/01/21 1222 04/02/21 0352 04/02/21 1305 04/03/21 0658 04/04/21 0400  HGB 9.4* 8.3* 8.3* 8.8* 8.2*  -IV ferric gluconate 250 mg x 1 -Monitor CBC  Infrarenal AAA-slightly progressed from 5.8 cm on CTA in 2020 to 6.1 cm on [place].  Slight progression.  Seen by Dr. Scot Dock with vascular surgery.  Per Dr. Nicole Cella note from 2020, "she does not have an adequate neck for an endovascular aneurysm repair.  Given that she would require an open repair or a fenestrated graft I would not recommend considering addressing the aneurysm unless it enlarges significantly in size and she feels quite strongly in agreement with this".  -Optimize blood pressure. -Outpatient follow-up with vascular surgery  History of stage III ovarian cancer-in remission s/p 4 cycles neoadjuvant chemotherapy 09/23/12 exploratory laparotomy, TAH/BSO, omentectomy -Followed at [facility].  Anxiety and depression: Seems to be a little anxious today.  Not on medication at home.  Generalized weakness/physical deconditioning-patient lives alone.  Now requiring oxygen.  Not safe to return home with oxygen and cane.  PT recommends frequent or constant supervision and assistance which is not possible since she lives alone. -Needs SNF placement.  Goal of care counseling: Admitted as full code.  However, after discussion about code status, patient stated that she is DNR/DNI.  Patient's sister at bedside, and in agreement.  Body mass index is 21.91 kg/m.         DVT prophylaxis:  On IV heparin for A. fib Rivaroxaban (XARELTO) tablet 15 mg  Code Status: DNR/DNI Family Communication: Updated patient's  sister at bedside. Level of care: Progressive Status is: Inpatient  Remains inpatient appropriate because:  hypoxic respiratory failure requiring supplemental oxygen, and safe disposition/SNF       Consultants:  None   Sch Meds:  Scheduled Meds:  digoxin  0.125 mg Oral Q M,W,F   diltiazem  240 mg Oral Daily   furosemide  20 mg Intravenous Daily   montelukast  10 mg Oral QPM   Rivaroxaban  15 mg Oral Q supper   umeclidinium bromide  2 puff Inhalation Daily   Continuous Infusions:   PRN Meds:.acetaminophen **OR** acetaminophen, diltiazem, levalbuterol, nitroGLYCERIN, polyethylene glycol, senna-docusate  Antimicrobials: Anti-infectives (From admission, onward)    None        I have personally reviewed the following labs and images: CBC: Recent Labs  Lab 04/01/21 1222 04/02/21 0352 04/02/21 1305 04/03/21 0658 04/04/21 0400  WBC 11.0* 10.5 9.1 9.6 7.8  NEUTROABS 9.3*  --   --   --   --  HGB 9.4* 8.3* 8.3* 8.8* 8.2*  HCT 32.8* 29.3* 29.4* 30.6* 28.2*  MCV 84.3 83.2 84.5 84.8 83.7  PLT 249 240 214 258 228   BMP &GFR Recent Labs  Lab 04/01/21 1222 04/02/21 0352 04/03/21 0658 04/04/21 0400  NA 138 139 138 138  K 4.4 4.0 4.2 3.9  CL 105 102 105 103  CO2 23 27 26 29   GLUCOSE 103* 112* 106* 100*  BUN 15 18 14 12   CREATININE 0.92 1.07* 0.93 0.98  CALCIUM 9.1 8.8* 9.0 9.2  MG  --  2.0 2.1 1.9  PHOS  --   --  3.3 3.6   Estimated Creatinine Clearance: 37.1 mL/min (by C-G formula based on SCr of 0.98 mg/dL). Liver & Pancreas: Recent Labs  Lab 04/01/21 1222 04/03/21 0658 04/04/21 0400  AST 15  --   --   ALT 19  --   --   ALKPHOS 78  --   --   BILITOT 1.3*  --   --   PROT 6.1*  --   --   ALBUMIN 3.4* 3.1* 2.9*   No results for input(s): LIPASE, AMYLASE in the last 168 hours. No results for input(s): AMMONIA in the last 168 hours. Diabetic: No results for input(s): HGBA1C in the last 72 hours. Recent Labs  Lab 04/01/21 2353  GLUCAP 103*    Cardiac Enzymes: No results for input(s): CKTOTAL, CKMB, CKMBINDEX, TROPONINI in the last 168 hours. No results for input(s): PROBNP in the last 8760 hours. Coagulation Profile: No results for input(s): INR, PROTIME in the last 168 hours. Thyroid Function Tests: Recent Labs    04/02/21 0352  TSH 1.284   Lipid Profile: No results for input(s): CHOL, HDL, LDLCALC, TRIG, CHOLHDL, LDLDIRECT in the last 72 hours. Anemia Panel: Recent Labs    04/02/21 0352  VITAMINB12 546  FOLATE 12.6  FERRITIN 30  TIBC 319  IRON 33  RETICCTPCT 2.9   Urine analysis:    Component Value Date/Time   COLORURINE YELLOW 02/05/2013 1402   APPEARANCEUR CLEAR 02/05/2013 1402   LABSPEC 1.021 02/05/2013 1402   PHURINE 5.0 02/05/2013 1402   GLUCOSEU NEGATIVE 02/05/2013 1402   HGBUR NEGATIVE 02/05/2013 1402   BILIRUBINUR SMALL (A) 02/05/2013 1402   KETONESUR 15 (A) 02/05/2013 1402   PROTEINUR NEGATIVE 02/05/2013 1402   UROBILINOGEN 0.2 02/05/2013 1402   NITRITE NEGATIVE 02/05/2013 1402   LEUKOCYTESUR TRACE (A) 02/05/2013 1402   Sepsis Labs: Invalid input(s): PROCALCITONIN, [place]  Microbiology: Recent Results (from the past 240 hour(s))  Resp Panel by RT-PCR (Flu A&B, Covid) Nasopharyngeal Swab     Status: None   Collection Time: 04/01/21 12:44 PM   Specimen: Nasopharyngeal Swab; Nasopharyngeal(NP) swabs in vial transport medium  Result Value Ref Range Status   SARS Coronavirus 2 by RT PCR NEGATIVE NEGATIVE Final    Comment: (NOTE) SARS-CoV-2 target nucleic acids are NOT DETECTED.  The SARS-CoV-2 RNA is generally detectable in upper respiratory specimens during the acute phase of infection. The lowest concentration of SARS-CoV-2 viral copies this assay can detect is 138 copies/mL. A negative result does not preclude SARS-Cov-2 infection and should not be used as the sole basis for treatment or other patient management decisions. A negative result may occur with  improper specimen  collection/handling, submission of specimen other than nasopharyngeal swab, presence of viral mutation(s) within the areas targeted by this assay, and inadequate number of viral copies(<138 copies/mL). A negative result must be combined with clinical observations, patient history, and epidemiological  information. The expected result is Negative.  Fact Sheet for Patients:  EntrepreneurPulse.com.au  Fact Sheet for Healthcare Providers:  IncredibleEmployment.be  This test is no t yet approved or cleared by the [place] FDA and  has been authorized for detection and/or diagnosis of SARS-CoV-2 by FDA under an Emergency Use Authorization (EUA). This EUA will remain  in effect (meaning this test can be used) for the duration of the COVID-19 declaration under Section 564(b)(1) of the Act, 21 U.S.C.section 360bbb-3(b)(1), unless the authorization is terminated  or revoked sooner.       Influenza A by PCR NEGATIVE NEGATIVE Final   Influenza B by PCR NEGATIVE NEGATIVE Final    Comment: (NOTE) The Xpert Xpress SARS-CoV-2/FLU/RSV plus assay is intended as an aid in the diagnosis of influenza from Nasopharyngeal swab specimens and should not be used as a sole basis for treatment. Nasal washings and aspirates are unacceptable for Xpert Xpress SARS-CoV-2/FLU/RSV testing.  Fact Sheet for Patients: EntrepreneurPulse.com.au  Fact Sheet for Healthcare Providers: IncredibleEmployment.be  This test is not yet approved or cleared by the [place] FDA and has been authorized for detection and/or diagnosis of SARS-CoV-2 by FDA under an Emergency Use Authorization (EUA). This EUA will remain in effect (meaning this test can be used) for the duration of the COVID-19 declaration under Section 564(b)(1) of the Act, 21 U.S.C. section 360bbb-3(b)(1), unless the authorization is terminated or revoked.  Performed at [facility], [place] [address] [place], [place] [address]     Radiology Studies: No results found.     Donis Kotowski T. [place]  If 7PM-7AM, please contact night-coverage [email or web address] 04/04/2021, 2:25 PM

## 2021-04-05 DIAGNOSIS — I7143 Infrarenal abdominal aortic aneurysm, without rupture: Secondary | ICD-10-CM | POA: Diagnosis not present

## 2021-04-05 DIAGNOSIS — I4891 Unspecified atrial fibrillation: Secondary | ICD-10-CM | POA: Diagnosis not present

## 2021-04-05 DIAGNOSIS — Z7901 Long term (current) use of anticoagulants: Secondary | ICD-10-CM | POA: Diagnosis not present

## 2021-04-05 DIAGNOSIS — D509 Iron deficiency anemia, unspecified: Secondary | ICD-10-CM

## 2021-04-05 DIAGNOSIS — I5033 Acute on chronic diastolic (congestive) heart failure: Secondary | ICD-10-CM

## 2021-04-05 DIAGNOSIS — J449 Chronic obstructive pulmonary disease, unspecified: Secondary | ICD-10-CM | POA: Diagnosis not present

## 2021-04-05 LAB — RESP PANEL BY RT-PCR (FLU A&B, COVID) ARPGX2
Influenza A by PCR: NEGATIVE
Influenza B by PCR: NEGATIVE
SARS Coronavirus 2 by RT PCR: NEGATIVE

## 2021-04-05 LAB — RENAL FUNCTION PANEL
Albumin: 2.9 g/dL — ABNORMAL LOW (ref 3.5–5.0)
Anion gap: 6 (ref 5–15)
BUN: 13 mg/dL (ref 8–23)
CO2: 30 mmol/L (ref 22–32)
Calcium: 9 mg/dL (ref 8.9–10.3)
Chloride: 99 mmol/L (ref 98–111)
Creatinine, Ser: 1.01 mg/dL — ABNORMAL HIGH (ref 0.44–1.00)
GFR, Estimated: 53 mL/min — ABNORMAL LOW (ref >60)
Glucose, Bld: 80 mg/dL (ref 70–99)
Phosphorus: 3.3 mg/dL (ref 2.5–4.6)
Potassium: 4.1 mmol/L (ref 3.5–5.1)
Sodium: 135 mmol/L (ref 135–145)

## 2021-04-05 LAB — HEMOGLOBIN AND HEMATOCRIT, BLOOD
HCT: 28.4 % — ABNORMAL LOW (ref 36.0–46.0)
Hemoglobin: 8.2 g/dL — ABNORMAL LOW (ref 12.0–15.0)

## 2021-04-05 LAB — BRAIN NATRIURETIC PEPTIDE: B Natriuretic Peptide: 120.7 pg/mL — ABNORMAL HIGH (ref 0.0–100.0)

## 2021-04-05 LAB — MAGNESIUM: Magnesium: 2 mg/dL (ref 1.7–2.4)

## 2021-04-05 MED ORDER — SENNOSIDES-DOCUSATE SODIUM 8.6-50 MG PO TABS: 1.0000 | ORAL_TABLET | Freq: Two times a day (BID) | ORAL | Status: AC | PRN

## 2021-04-05 MED ORDER — FUROSEMIDE 20 MG PO TABS: 20.0000 mg | ORAL_TABLET | Freq: Every day | ORAL | Status: AC

## 2021-04-05 MED ORDER — DILTIAZEM HCL ER COATED BEADS 240 MG PO CP24: 240.0000 mg | ORAL_CAPSULE | Freq: Every day | ORAL | Status: AC

## 2021-04-05 MED ORDER — POLYETHYLENE GLYCOL 3350 17 G PO PACK: 17.0000 g | PACK | Freq: Two times a day (BID) | ORAL | 0 refills | Status: AC | PRN

## 2021-04-05 MED ORDER — BUDESONIDE-FORMOTEROL FUMARATE 160-4.5 MCG/ACT IN AERO: 2.0000 | INHALATION_SPRAY | Freq: Two times a day (BID) | RESPIRATORY_TRACT | 12 refills | Status: AC

## 2021-04-05 NOTE — Consult Note (Signed)
   [facility] CM Inpatient Consult   04/05/2021  Izamar Linden January 25, 1929 [id number]  [facility] [ACO] Patient: Medicare CMS DCE  Primary Care Provider:  Nicholos Johns, MD [facility] Internal Medicine   If the patient goes to a [facility] affiliated facility then, patient can be followed by [place] [facility] RN with traditional Medicare.  [facility] PAC RN can follow for any known or needs for transitional care needs for returning to post facility care or complex disease management.  Plan:  Follow for disposition and refer if appropriate.  For questions or referrals, please contact:   Natividad Brood, RN BSN [facility] Liaison  [phone] business mobile phone Toll free office [phone]  Fax number: [phone] [email or web address]

## 2021-04-05 NOTE — Discharge Summary (Signed)
Physician Discharge Summary  Adriana Castro JKK:[id number] DOB: July 14, 1928 DOA: 04/01/2021  PCP: Nicholos Johns, MD  Admit date: 04/01/2021 Discharge date: 04/05/2021 Admitted From: Home Disposition: SNF Recommendations for Outpatient Follow-up:  Follow ups as below. Please obtain CBC/BMP/Mag at follow up Check digoxin level in 3 to 4 days. Recommend palliative follow-up at SNF. Reassess fluid status and adjust diuretics as appropriate. Please follow up on the following pending results: None   Discharge Condition: Stable but guarded prognosis CODE STATUS: DNR/DNI  Follow-up Information     Nicholos Johns, MD Follow up.   Specialty: Internal Medicine Contact information: [place] [id number] [place] [facility] 87 year old F with PMH of COPD, A. fib off AC, AAA, HTN, ovarian cancer, anxiety and depression presenting with progressive SOB and DOE for 3 weeks, and admitted for A. fib with RVR and possible CHF exacerbation.  Patient was taken off Xarelto by PCP about 3 weeks ago.  She denies melena, hematochezia or fall.  Concerned about noncompliance with the other cardiac medications on admission although she denies.  Started on Cardizem drip, IV heparin and IV Lasix.    Heart rate improved and she came off IV Cardizem drip.  She is discharged on Cardizem CD, home digoxin and Xarelto.  In regards to acute CHF, she was diuresed with IV Lasix, and discharged on p.o. Lasix 20 mg daily.  Reassess fluid status and adjust diuretics as appropriate.  Overall, patient's prognosis is guarded.  Recommend palliative follow-up at SNF.  See individual problem list below for more on hospital course.  Discharge Diagnoses:  A. fib with RVR-RVR improved.  HR  90-100.  Concern about noncompliance.  Dig level was undetectable.  Taken off Xarelto by PCP about 3 weeks ago but unclear why.  May be due to anemia and fall risk.  Denies fall, melena or  hematochezia but she lives alone. CHA2DS2-VASc's at least 6. TSH within normal.  TTE with LVEF of 60 to 65%, indeterminate DD, sev LAE, mod MVR, RVSP of 47.   -Off Cardizem drip. -Increased home Cardizem CD to 240 mg daily. -Continue home digoxin 0.125 mg MWF -Check digoxin level in 3 to 4 days. -Resume home Xarelto after risk-benefit discussion.  Patient prefers to risk of bleeding to avoid CVA.   Acute diastolic CHF: TTE as above.  Appears euvolemic on exam but CXR with bilateral interstitial opacities and small bilateral pleural effusion raising concern for pulmonary edema.  Diuresed with IV Lasix with improvement in his symptoms. -Discharged on p.o. Lasix 20 mg daily -Fluid restriction to less than 1500 cc a day and sodium restriction to less than 2 g -Reassess fluid status of renal function and adjust diuretics as appropriate.   Acute respiratory failure with hypoxia:  Suspect this to be due to A. fib and CHF.  Could have some atelectasis.  She has poor inspiratory effort, about 250 cc on IS.  Improved but requires 2 L with exertion. -Discharged on 2 L by nasal cannula. -Manage A. fib and CHF as above -Incentive spirometry/OOB/PT/OT   Chronic COPD: Doubt exacerbation. -Discharged home Symbicort and Incruse Ellipta -Xopenex and Atrovent as needed   Iron deficiency anemia: Iron saturation 10%.  Ferritin low normal.  TIBC high normal.  H&H stable. Recent Labs    04/01/21 1222 04/02/21 0352 04/02/21 1305 04/03/21 0658 04/04/21 0400 04/05/21 0331  HGB 9.4* 8.3* 8.3* 8.8*  8.2* 8.2*  -IV ferric gluconate 250 mg x 1 -Recheck CBC in 1 week    Infrarenal AAA-slightly progressed from 5.8 cm on CTA in 2020 to 6.1 cm on [place].  Slight progression.  Seen by Dr. Scot Dock with vascular surgery.  Per Dr. Nicole Cella note from 2020, "she does not have an adequate neck for an endovascular aneurysm repair.  Given that she would require an open repair or a fenestrated graft I would not recommend  considering addressing the aneurysm unless it enlarges significantly in size and she feels quite strongly in agreement with this".  -Optimize blood pressure.   History of stage III ovarian cancer-in remission s/p 4 cycles neoadjuvant chemotherapy 09/23/12 exploratory laparotomy, TAH/BSO, omentectomy -Followed at [facility].   Anxiety and depression: Seems to be a little anxious today.  Not on medication at home.   Generalized weakness/physical deconditioning-patient lives alone.  Now requiring oxygen.  Not safe to return home with oxygen and cane.  PT recommends frequent or constant supervision and assistance which is not possible since she lives alone. -Continue PT/OT at [facility].   Goal of care counseling: Admitted as full code.  However, after discussion about code status, patient stated that she is DNR/DNI.  Patient's sister at bedside, and in agreement. -Palliative follow-up at SNF.    Body mass index is 21.5 kg/m.           Discharge Exam: Vitals:   04/04/21 1949 04/05/21 0018 04/05/21 0400 04/05/21 0911  BP: (!) 143/77 122/70 123/60   Pulse: 84 81 85   Temp: 98.2 F (36.8 C) 98.4 F (36.9 C) 98.3 F (36.8 C) 97.6 F (36.4 C)  Resp: 20 16 18    Height:      Weight:   62.3 kg   SpO2: 96% 96% 94%   TempSrc: Oral Oral Oral Oral  BMI (Calculated):   21.5      GENERAL: No apparent distress.  Nontoxic. HEENT: MMM.  Vision and hearing grossly intact.  NECK: Supple.  No apparent JVD.  RESP: 94% on 2 L at rest.  No IWOB.  Diminished aeration due to poor respiratory effort. CVS: IR.  HR 80 to 90 bpm.  Heart sounds normal.  ABD/GI/GU: Bowel sounds present. Soft. Non tender.  MSK/EXT:  Moves extremities. No apparent deformity. No edema.  SKIN: no apparent skin lesion or wound NEURO: Awake and alert.  Oriented appropriately.  No apparent focal neuro deficit. PSYCH: Calm. Normal affect.   Discharge Instructions  Discharge Instructions     Diet - low sodium heart healthy    Complete by: As directed    Increase activity slowly   Complete by: As directed       Allergies as of 04/05/2021       Reactions   Albuterol Nausea And Vomiting        Medication List     STOP taking these medications    atorvastatin 10 MG tablet Commonly known as: LIPITOR   azithromycin 250 MG tablet Commonly known as: ZITHROMAX   ipratropium 0.02 % nebulizer solution Commonly known as: ATROVENT   predniSONE 10 MG (21) Tbpk tablet Commonly known as: STERAPRED UNI-PAK 21 TAB   Vitamin D (Ergocalciferol) 1.25 MG (50000 UNIT) Caps capsule Commonly known as: DRISDOL   vitamin E 180 MG (400 UNITS) capsule       TAKE these medications    budesonide-formoterol 160-4.5 MCG/ACT inhaler Commonly known as: Symbicort Inhale 2 puffs into the lungs 2 (two) times daily.  digoxin 0.125 MG tablet Commonly known as: LANOXIN TAKE 1 TABLET (0.125 MG TOTAL) BY MOUTH EVERY MONDAY, WEDNESDAY, AND FRIDAY. What changed: See the new instructions.   diltiazem 240 MG 24 hr capsule Commonly known as: CARDIZEM CD Take 1 capsule (240 mg total) by mouth daily. Start taking on: April 06, 2021 What changed:  medication strength how much to take when to take this   fluticasone 50 MCG/ACT nasal spray Commonly known as: FLONASE Place 1 spray into both nostrils daily as needed for allergies.   furosemide 20 MG tablet Commonly known as: LASIX Take 1 tablet (20 mg total) by mouth daily. What changed:  when to take this reasons to take this   Incruse Ellipta 62.5 MCG/ACT Aepb Generic drug: umeclidinium bromide Inhale 2 puffs into the lungs daily.   montelukast 10 MG tablet Commonly known as: SINGULAIR Take 10 mg by mouth every evening.   nitroGLYCERIN 0.4 MG SL tablet Commonly known as: NITROSTAT PLACE 1 TABLET UNDER TONGUE EVERY 5 MINS, UP TO 3 DOSES AS NEEDED FOR CHEST PAIN What changed: See the new instructions.   omeprazole 20 MG capsule Commonly known as:  PRILOSEC Take 20 mg by mouth daily as needed for indigestion.   polyethylene glycol 17 g packet Commonly known as: MIRALAX / GLYCOLAX Take 17 g by mouth 2 (two) times daily as needed for mild constipation.   Rivaroxaban 15 MG Tabs tablet Commonly known as: XARELTO Take 1 tablet (15 mg total) by mouth daily with supper.   senna-docusate 8.6-50 MG tablet Commonly known as: Senokot-S Take 1 tablet by mouth 2 (two) times daily as needed for moderate constipation.   Xopenex HFA 45 MCG/ACT inhaler Generic drug: levalbuterol Inhale 2 puffs into the lungs in the morning, at noon, in the evening, and at bedtime.        Consultations: None  Procedures/Studies:   DG Chest 2 View  Result Date: 04/01/2021 CLINICAL DATA:  Shortness of breath EXAM: CHEST - 2 VIEW COMPARISON:  Chest x-ray dated February 26, 2020 FINDINGS: Cardiac and mediastinal contours are partially obscured but appeared within normal limits. Small left greater than right pleural effusions with associated atelectasis. No focal consolidation. Diffuse bilateral interstitial opacities. No evidence of pneumothorax. IMPRESSION: Bilateral interstitial opacities and small bilateral pleural effusions, likely due to pulmonary edema. Electronically Signed   By: Yetta Glassman M.D.   On: 04/01/2021 13:10   DG Abdomen 1 View  Result Date: 04/01/2021 CLINICAL DATA:  Constipation. EXAM: ABDOMEN - 1 VIEW COMPARISON:  Abdominopelvic CT 02/25/2018, CTA 03/19/2019 FINDINGS: Small volume of stool throughout the colon, no increased stool burden to suggest constipation. No bowel dilatation or evidence of obstruction. No free intra-abdominal air. Advanced aortic atherosclerosis. Infrarenal aortic aneurysm with aortic calcifications spanning 6.8 cm. Aneurysm measured 6.5 cm when measured on prior scout radiograph. The bones are diffusely under mineralized. IMPRESSION: 1. Small volume of colonic stool, no increased stool burden to suggest  constipation. No bowel obstruction. 2. Aortic atherosclerosis. Infrarenal aortic aneurysm with aortic calcifications spanning 6.8 cm. Aneurysm measured 6.5 cm on prior scout radiograph, and 5.8 cm when measured on prior CTA. Recommend follow-up assessment with CTA, acuity based on patient's symptoms. Electronically Signed   By: Keith Rake M.D.   On: 04/01/2021 22:00   US AORTA  Result Date: 04/03/2021 CLINICAL DATA:  Aortic aneurysm EXAM: ULTRASOUND OF ABDOMINAL AORTA TECHNIQUE: Ultrasound examination of the abdominal aorta and proximal common iliac arteries was performed to evaluate for aneurysm. Additional color  and Doppler images of the distal aorta were obtained to document patency. COMPARISON:  CTA done on 03/19/2019 FINDINGS: Abdominal aortic measurements as follows: Proximal:  2.7 cm Mid:  6.1 cm Distal:  1.6 cm Patent: Yes, peak systolic velocity is 78 cm/s Right common iliac artery: 0.9 cm Left common iliac artery: 1.2 cm According to the note by the technologist, examination was technically difficult. IMPRESSION: There is infrarenal aortic aneurysm measuring 6.1 cm in maximum diameter. Electronically Signed   By: Elmer Picker M.D.   On: 04/03/2021 13:02   ECHOCARDIOGRAM COMPLETE  Result Date: 04/02/2021    ECHOCARDIOGRAM REPORT   Patient Name:   Adriana Castro Date of Exam: 04/02/2021 Medical Rec #:  [id number]     Height:       67.0 in Accession #:    [id number]    Weight:       132.9 lb Date of Birth:  1928-07-27     BSA:          1.700 m Patient Age:    73 years      BP:           146/72 mmHg Patient Gender: F             HR:           105 bpm. Exam Location:  Inpatient Procedure: 2D Echo, Cardiac Doppler and Color Doppler Indications:    CHF-Acute Diastolic  History:        Patient has prior history of Echocardiogram examinations, most                 recent 03/19/2018. CHF, COPD, Arrythmias:Atrial Fibrillation,                 Signs/Symptoms:Chest Pain; Risk Factors:Hypertension  and                 Dyslipidemia.  Sonographer:    Wenda Low Referring Phys: Holley  1. Moderate, eccentric MR suggestive of prolapse of posterior MV leaflet.  2. Left ventricular ejection fraction, by estimation, is 60 to 65%. The left ventricle has normal function. The left ventricle has no regional wall motion abnormalities. Left ventricular diastolic parameters are indeterminate. Elevated left atrial pressure.  3. Right ventricular systolic function is normal. The right ventricular size is normal. There is moderately elevated pulmonary artery systolic pressure.  4. Left atrial size was severely dilated.  5. Right atrial size was mildly dilated.  6. The mitral valve is abnormal. Moderate mitral valve regurgitation. No evidence of mitral stenosis. There is mild late systolic prolapse of posterior leaflet of the mitral valve. Moderate mitral annular calcification.  7. The aortic valve is calcified. Aortic valve regurgitation is not visualized. No aortic stenosis is present.  8. The inferior vena cava is dilated in size with >50% respiratory variability, suggesting right atrial pressure of 8 mmHg. FINDINGS  Left Ventricle: Left ventricular ejection fraction, by estimation, is 60 to 65%. The left ventricle has normal function. The left ventricle has no regional wall motion abnormalities. The left ventricular internal cavity size was normal in size. There is  no left ventricular hypertrophy. Left ventricular diastolic parameters are indeterminate. Elevated left atrial pressure. Right Ventricle: The right ventricular size is normal. Right ventricular systolic function is normal. There is moderately elevated pulmonary artery systolic pressure. The tricuspid regurgitant velocity is 3.14 m/s, and with an assumed right atrial pressure of 8 mmHg, the estimated right ventricular systolic pressure is 47.4  mmHg. Left Atrium: Left atrial size was severely dilated. Right Atrium: Right atrial  size was mildly dilated. Pericardium: There is no evidence of pericardial effusion. Mitral Valve: The mitral valve is abnormal. There is mild late systolic prolapse of posterior leaflet of the mitral valve. Moderate mitral annular calcification. Moderate mitral valve regurgitation. No evidence of mitral valve stenosis. MV peak gradient,  9.9 mmHg. The mean mitral valve gradient is 3.0 mmHg. Tricuspid Valve: The tricuspid valve is normal in structure. Tricuspid valve regurgitation is mild . No evidence of tricuspid stenosis. Aortic Valve: The aortic valve is calcified. Aortic valve regurgitation is not visualized. No aortic stenosis is present. Aortic valve mean gradient measures 6.0 mmHg. Aortic valve peak gradient measures 14.6 mmHg. Aortic valve area, by VTI measures 2.38  cm. Pulmonic Valve: The pulmonic valve was normal in structure. Pulmonic valve regurgitation is trivial. No evidence of pulmonic stenosis. Aorta: The aortic root is normal in size and structure. Venous: The inferior vena cava is dilated in size with greater than 50% respiratory variability, suggesting right atrial pressure of 8 mmHg. IAS/Shunts: No atrial level shunt detected by color flow Doppler. Additional Comments: Moderate, eccentric MR suggestive of prolapse of posterior MV leaflet. There is a small pleural effusion in the left lateral region.  LEFT VENTRICLE PLAX 2D LVIDd:         4.40 cm   Diastology LVIDs:         2.50 cm   LV e' medial:    6.31 cm/s LV PW:         0.90 cm   LV E/e' medial:  22.2 LV IVS:        1.10 cm   LV e' lateral:   8.16 cm/s LVOT diam:     2.00 cm   LV E/e' lateral: 17.2 LV SV:         69 LV SV Index:   41 LVOT Area:     3.14 cm  RIGHT VENTRICLE RV Basal diam:  3.65 cm RV Mid diam:    3.10 cm RV S prime:     12.70 cm/s TAPSE (M-mode): 1.7 cm LEFT ATRIUM             Index        RIGHT ATRIUM           Index LA diam:        5.10 cm 3.00 cm/m   RA Area:     19.60 cm LA Vol (A2C):   88.3 ml 51.94 ml/m  RA Volume:    56.60 ml  33.29 ml/m LA Vol (A4C):   79.1 ml 46.53 ml/m LA Biplane Vol: 87.2 ml 51.29 ml/m  AORTIC VALVE                     PULMONIC VALVE AV Area (Vmax):    2.48 cm      PV Vmax:       0.64 m/s AV Area (Vmean):   2.24 cm      PV Peak grad:  1.7 mmHg AV Area (VTI):     2.38 cm AV Vmax:           191.00 cm/s AV Vmean:          113.000 cm/s AV VTI:            0.292 m AV Peak Grad:      14.6 mmHg AV Mean Grad:      6.0 mmHg LVOT Vmax:  151.00 cm/s LVOT Vmean:        80.700 cm/s LVOT VTI:          0.221 m LVOT/AV VTI ratio: 0.76  AORTA Ao Root diam: 2.60 cm Ao Asc diam:  3.20 cm MITRAL VALVE                TRICUSPID VALVE MV Area (PHT): 3.68 cm     TR Peak grad:   39.4 mmHg MV Area VTI:   2.25 cm     TR Vmax:        314.00 cm/s MV Peak grad:  9.9 mmHg MV Mean grad:  3.0 mmHg     SHUNTS MV Vmax:       1.57 m/s     Systemic VTI:  0.22 m MV Vmean:      79.4 cm/s    Systemic Diam: 2.00 cm MV Decel Time: 206 msec MV E velocity: 140.00 cm/s Kirk Ruths MD Electronically signed by Kirk Ruths MD Signature Date/Time: 04/02/2021/12:54:10 PM    Final        The results of significant diagnostics from this hospitalization (including imaging, microbiology, ancillary and laboratory) are listed below for reference.     Microbiology: Recent Results (from the past 240 hour(s))  Resp Panel by RT-PCR (Flu A&B, Covid) Nasopharyngeal Swab     Status: None   Collection Time: 04/01/21 12:44 PM   Specimen: Nasopharyngeal Swab; Nasopharyngeal(NP) swabs in vial transport medium  Result Value Ref Range Status   SARS Coronavirus 2 by RT PCR NEGATIVE NEGATIVE Final    Comment: (NOTE) SARS-CoV-2 target nucleic acids are NOT DETECTED.  The SARS-CoV-2 RNA is generally detectable in upper respiratory specimens during the acute phase of infection. The lowest concentration of SARS-CoV-2 viral copies this assay can detect is 138 copies/mL. A negative result does not preclude SARS-Cov-2 infection and should  not be used as the sole basis for treatment or other patient management decisions. A negative result may occur with  improper specimen collection/handling, submission of specimen other than nasopharyngeal swab, presence of viral mutation(s) within the areas targeted by this assay, and inadequate number of viral copies(<138 copies/mL). A negative result must be combined with clinical observations, patient history, and epidemiological information. The expected result is Negative.  Fact Sheet for Patients:  EntrepreneurPulse.com.au  Fact Sheet for Healthcare Providers:  IncredibleEmployment.be  This test is no t yet approved or cleared by the [place] FDA and  has been authorized for detection and/or diagnosis of SARS-CoV-2 by FDA under an Emergency Use Authorization (EUA). This EUA will remain  in effect (meaning this test can be used) for the duration of the COVID-19 declaration under Section 564(b)(1) of the Act, 21 U.S.C.section 360bbb-3(b)(1), unless the authorization is terminated  or revoked sooner.       Influenza A by PCR NEGATIVE NEGATIVE Final   Influenza B by PCR NEGATIVE NEGATIVE Final    Comment: (NOTE) The Xpert Xpress SARS-CoV-2/FLU/RSV plus assay is intended as an aid in the diagnosis of influenza from Nasopharyngeal swab specimens and should not be used as a sole basis for treatment. Nasal washings and aspirates are unacceptable for Xpert Xpress SARS-CoV-2/FLU/RSV testing.  Fact Sheet for Patients: EntrepreneurPulse.com.au  Fact Sheet for Healthcare Providers: IncredibleEmployment.be  This test is not yet approved or cleared by the [place] FDA and has been authorized for detection and/or diagnosis of SARS-CoV-2 by FDA under an Emergency Use Authorization (EUA). This EUA will remain in effect (meaning this test  can be used) for the duration of the COVID-19 declaration under  Section 564(b)(1) of the Act, 21 U.S.C. section 360bbb-3(b)(1), unless the authorization is terminated or revoked.  Performed at [facility], [place] [address], [place], [place] [address]      Labs:  CBC: Recent Labs  Lab 04/01/21 1222 04/02/21 0352 04/02/21 1305 04/03/21 0658 04/04/21 0400 04/05/21 0331  WBC 11.0* 10.5 9.1 9.6 7.8  --   NEUTROABS 9.3*  --   --   --   --   --   HGB 9.4* 8.3* 8.3* 8.8* 8.2* 8.2*  HCT 32.8* 29.3* 29.4* 30.6* 28.2* 28.4*  MCV 84.3 83.2 84.5 84.8 83.7  --   PLT 249 240 214 258 228  --    BMP &GFR Recent Labs  Lab 04/01/21 1222 04/02/21 0352 04/03/21 0658 04/04/21 0400 04/05/21 0331  NA 138 139 138 138 135  K 4.4 4.0 4.2 3.9 4.1  CL 105 102 105 103 99  CO2 23 27 26 29 30   GLUCOSE 103* 112* 106* 100* 80  BUN 15 18 14 12 13   CREATININE 0.92 1.07* 0.93 0.98 1.01*  CALCIUM 9.1 8.8* 9.0 9.2 9.0  MG  --  2.0 2.1 1.9 2.0  PHOS  --   --  3.3 3.6 3.3   Estimated Creatinine Clearance: 36 mL/min (A) (by C-G formula based on SCr of 1.01 mg/dL (H)). Liver & Pancreas: Recent Labs  Lab 04/01/21 1222 04/03/21 0658 04/04/21 0400 04/05/21 0331  AST 15  --   --   --   ALT 19  --   --   --   ALKPHOS 78  --   --   --   BILITOT 1.3*  --   --   --   PROT 6.1*  --   --   --   ALBUMIN 3.4* 3.1* 2.9* 2.9*   No results for input(s): LIPASE, AMYLASE in the last 168 hours. No results for input(s): AMMONIA in the last 168 hours. Diabetic: No results for input(s): HGBA1C in the last 72 hours. Recent Labs  Lab 04/01/21 2353  GLUCAP 103*   Cardiac Enzymes: No results for input(s): CKTOTAL, CKMB, CKMBINDEX, TROPONINI in the last 168 hours. No results for input(s): PROBNP in the last 8760 hours. Coagulation Profile: No results for input(s): INR, PROTIME in the last 168 hours. Thyroid Function Tests: No results for input(s): TSH, T4TOTAL, FREET4, T3FREE, THYROIDAB in the last 72 hours. Lipid Profile: No results for input(s): CHOL, HDL,  LDLCALC, TRIG, CHOLHDL, LDLDIRECT in the last 72 hours. Anemia Panel: No results for input(s): VITAMINB12, FOLATE, FERRITIN, TIBC, IRON, RETICCTPCT in the last 72 hours. Urine analysis:    Component Value Date/Time   COLORURINE YELLOW 02/05/2013 1402   APPEARANCEUR CLEAR 02/05/2013 1402   LABSPEC 1.021 02/05/2013 1402   PHURINE 5.0 02/05/2013 1402   GLUCOSEU NEGATIVE 02/05/2013 1402   HGBUR NEGATIVE 02/05/2013 1402   BILIRUBINUR SMALL (A) 02/05/2013 1402   KETONESUR 15 (A) 02/05/2013 1402   PROTEINUR NEGATIVE 02/05/2013 1402   UROBILINOGEN 0.2 02/05/2013 1402   NITRITE NEGATIVE 02/05/2013 1402   LEUKOCYTESUR TRACE (A) 02/05/2013 1402   Sepsis Labs: Invalid input(s): PROCALCITONIN, LACTICIDVEN   Time coordinating discharge: 55 minutes  SIGNED:  Mercy Riding, MD  [facility] 04/05/2021, 3:15 PM

## 2021-04-05 NOTE — NC FL2 (Signed)
[place] MEDICAID FL2 LEVEL OF CARE SCREENING TOOL     IDENTIFICATION  Patient Name: Adriana Castro Birthdate: [birth year] Sex: female Admission Date (Current Location): 04/01/2021  [facility] and [place] Number:  Herbalist and Address:  The . [facility], [place] [address] [place], [place] [address]      Provider Number: [id number]  Attending Physician Name and Address:  Mercy Riding, MD  Relative Name and Phone Number:       Current Level of Care: Hospital Recommended Level of Care: [place] Prior Approval Number:    Date Approved/Denied:   PASRR Number: [id number]  Discharge Plan: SNF    Current Diagnoses: Patient Active Problem List   Diagnosis Date Noted   Acute on chronic heart failure ([place]) 04/02/2021   Atrial fibrillation with rapid ventricular response ([place]) 04/01/2021   Arthritis    Dyslipidemia    Dysrhythmia    Heart murmur    Hypertension    Irregular heart beat    Mood disorder ([place])    Ovarian cancer ([place])    Shortness of breath    History of open reduction and internal fixation (ORIF) procedure 11/01/2016   Closed bicondylar fracture of distal end of left humerus    Abdominal aortic aneurysm without rupture 01/06/2016   Bilateral carotid artery stenosis 01/06/2016   Encounter for antineoplastic chemotherapy 11/09/2013   Malignant neoplasm of ovary ([place]) 09/28/2013   Malignant ovarian neoplasm ([place]) 09/28/2013   HTN (hypertension) 09/10/2013   Hyperlipidemia 09/10/2013   Adrenal adenoma 09/10/2013   Anxiety 09/10/2013   Depression 09/10/2013   GERD (gastroesophageal reflux disease) 09/10/2013   Atrial fibrillation ([place]) 09/10/2013   Aneurysm ([place]) 09/10/2013   COPD (chronic obstructive pulmonary disease) ([place]) 02/05/2013   Hypoxia 02/05/2013   Chest pain 02/05/2013   Leaky heart valve     Orientation RESPIRATION BLADDER Height & Weight     Self, Time, Situation, Place  O2 (2L) Continent,  External catheter Weight: 137 lb 4.8 oz (62.3 kg) Height:  5\' 7"  (170.2 cm)  BEHAVIORAL SYMPTOMS/MOOD NEUROLOGICAL BOWEL NUTRITION STATUS      Continent Diet (Please See DC Summary)  AMBULATORY STATUS COMMUNICATION OF NEEDS Skin   Limited Assist Verbally Normal                       Personal Care Assistance Level of Assistance  Bathing, Feeding, Dressing Bathing Assistance: Limited assistance Feeding assistance: Limited assistance Dressing Assistance: Limited assistance     Functional Limitations Info  Sight, Hearing, Speech Sight Info: Adequate Hearing Info: Adequate Speech Info: Adequate    SPECIAL CARE FACTORS FREQUENCY  PT (By licensed PT), OT (By licensed OT)     PT Frequency: 5x/week OT Frequency: 5x/week            Contractures Contractures Info: Not present    Additional Factors Info  Code Status, Allergies Code Status Info: DNR Allergies Info: Albuterol           Current Medications (04/05/2021):  This is the current hospital active medication list Current Facility-Administered Medications  Medication Dose Route Frequency Provider Last Rate Last Admin   acetaminophen (TYLENOL) tablet 650 mg  650 mg Oral Q6H PRN Rise Patience, MD       Or   acetaminophen (TYLENOL) suppository 650 mg  650 mg Rectal Q6H PRN Rise Patience, MD       digoxin (LANOXIN) tablet 0.125 mg  0.125 mg Oral Q  M,W,F Rise Patience, MD   0.125 mg at 04/04/21 7121   diltiazem (CARDIZEM CD) 24 hr capsule 240 mg  240 mg Oral Daily Wendee Beavers T, MD   240 mg at 04/05/21 0957   diltiazem (CARDIZEM) tablet 30 mg  30 mg Oral Q6H PRN Mercy Riding, MD       furosemide (LASIX) injection 20 mg  20 mg Intravenous Daily Wendee Beavers T, MD   20 mg at 04/05/21 0957   levalbuterol (XOPENEX) nebulizer solution 0.63 mg  0.63 mg Inhalation Q6H PRN Wendee Beavers T, MD   0.63 mg at 04/05/21 0421   montelukast (SINGULAIR) tablet 10 mg  10 mg Oral QPM Rise Patience, MD   10 mg at  04/04/21 1809   nitroGLYCERIN (NITROSTAT) SL tablet 0.4 mg  0.4 mg Sublingual Q5 min PRN Rise Patience, MD       polyethylene glycol (MIRALAX / GLYCOLAX) packet 17 g  17 g Oral BID PRN Mercy Riding, MD       revefenacin (YUPELRI) nebulizer solution 175 mcg  175 mcg Nebulization Daily Wendee Beavers T, MD   175 mcg at 04/05/21 0749   Rivaroxaban (XARELTO) tablet 15 mg  15 mg Oral Q supper Donnamae Jude, RPH   15 mg at 04/04/21 1809   senna-docusate (Senokot-S) tablet 1 tablet  1 tablet Oral BID PRN Mercy Riding, MD         Discharge Medications: Please see discharge summary for a list of discharge medications.  Relevant Imaging Results:  Relevant Lab Results:   Additional Information SSN# [id number] COVID vaccine PFizer 06/21/19, 07/12/19, 02/06/20, 10/07/20, 03/03/21  Daiki Dicostanzo, LCSWA

## 2021-04-05 NOTE — Progress Notes (Signed)
Mobility Specialist Progress Note:   04/05/21 1016  Mobility  Activity Ambulated in hall  Level of Assistance Standby assist, set-up cues, supervision of patient - no hands on  Assistive Device Front wheel walker  Distance Ambulated (ft) 120 ft  Mobility Sit up in bed/chair position for meals;Ambulated with assistance in hallway  Mobility Response Tolerated well  Mobility performed by Mobility specialist  $Mobility charge 1 Mobility   Pre- Mobility: 95 HR; 96% SpO2 During Mobility:120 HR Post Mobility:  90 HR; 93% SpO2  Pt received EOB willing to participate in mobility. No complaints of pain during ambualtion. Pt returned to EOB ans stated she felt "woozy". Pt left EOB with call bell in reach and all needs met.   [facility] Health and safety inspector Phone [phone]

## 2021-04-05 NOTE — Care Management Important Message (Signed)
Important Message  Patient Details  Name: Adriana Castro MRN: [id number] Date of Birth: 04/27/1929   Medicare Important Message Given:  Yes     Shelda Altes 04/05/2021, 11:16 AM

## 2021-04-05 NOTE — TOC Progression Note (Signed)
Transition of Care [facility]) - Progression Note    Patient Details  Name: [place] Fein MRN: [id number] Date of Birth: 02/16/1929  Transition of Care [facility]) CM/SW [place], [place] Phone Number: 04/05/2021, 5:17 PM  Clinical Narrative:    HF CSW spoke with Ms. Carlota Raspberry at bedside regarding the discharge plan and Ms. Pagan reported to be open to going to [facility] in [place]. CSW informed Ms. Carlota Raspberry that unfortunately [facility] doesn't have a bed available for rehab but [facility] does and that she can go to [facility] when a bed becomes available. Ms. Furney reported that would be okay. Ms. Loh began to cry and process about her life feeling that she doesn't have much time left. CSW provided active, empathetic listening and support. Ms. Johal reported that she lives alone and that her husband died 90 years ago and that she doesn't have any children but she has 2 sisters that live in the surrounding area.  CSW reached out to [place] at [facility] and she reported she cannot take Ms. Carlota Raspberry today for discharge but will have a bed available tomorrow at [facility]. Rapid COVID test requested.  CSW will continue to follow throughout discharge.       Expected Discharge Plan and Services           Expected Discharge Date: 04/05/21                                     Social Determinants of Health (SDOH) Interventions    Readmission Risk Interventions No flowsheet data found.  [place] Fischel, MSW, [place] Heart Failure Social Worker

## 2021-04-05 NOTE — Discharge Instructions (Signed)

## 2021-04-06 DIAGNOSIS — F419 Anxiety disorder, unspecified: Secondary | ICD-10-CM | POA: Diagnosis not present

## 2021-04-06 DIAGNOSIS — Z7901 Long term (current) use of anticoagulants: Secondary | ICD-10-CM | POA: Diagnosis not present

## 2021-04-06 DIAGNOSIS — J9601 Acute respiratory failure with hypoxia: Secondary | ICD-10-CM | POA: Diagnosis not present

## 2021-04-06 DIAGNOSIS — J441 Chronic obstructive pulmonary disease with (acute) exacerbation: Secondary | ICD-10-CM | POA: Diagnosis not present

## 2021-04-06 DIAGNOSIS — K219 Gastro-esophageal reflux disease without esophagitis: Secondary | ICD-10-CM | POA: Diagnosis not present

## 2021-04-06 DIAGNOSIS — R5383 Other fatigue: Secondary | ICD-10-CM | POA: Diagnosis not present

## 2021-04-06 DIAGNOSIS — R079 Chest pain, unspecified: Secondary | ICD-10-CM | POA: Diagnosis not present

## 2021-04-06 DIAGNOSIS — E785 Hyperlipidemia, unspecified: Secondary | ICD-10-CM | POA: Diagnosis not present

## 2021-04-06 DIAGNOSIS — J90 Pleural effusion, not elsewhere classified: Secondary | ICD-10-CM | POA: Diagnosis not present

## 2021-04-06 DIAGNOSIS — I10 Essential (primary) hypertension: Secondary | ICD-10-CM | POA: Diagnosis not present

## 2021-04-06 DIAGNOSIS — K5900 Constipation, unspecified: Secondary | ICD-10-CM | POA: Diagnosis not present

## 2021-04-06 DIAGNOSIS — Z743 Need for continuous supervision: Secondary | ICD-10-CM | POA: Diagnosis not present

## 2021-04-06 DIAGNOSIS — J449 Chronic obstructive pulmonary disease, unspecified: Secondary | ICD-10-CM | POA: Diagnosis not present

## 2021-04-06 DIAGNOSIS — R531 Weakness: Secondary | ICD-10-CM | POA: Diagnosis not present

## 2021-04-06 DIAGNOSIS — I4891 Unspecified atrial fibrillation: Secondary | ICD-10-CM | POA: Diagnosis not present

## 2021-04-06 DIAGNOSIS — F32A Depression, unspecified: Secondary | ICD-10-CM | POA: Diagnosis not present

## 2021-04-06 DIAGNOSIS — D509 Iron deficiency anemia, unspecified: Secondary | ICD-10-CM | POA: Diagnosis not present

## 2021-04-06 DIAGNOSIS — R2681 Unsteadiness on feet: Secondary | ICD-10-CM | POA: Diagnosis not present

## 2021-04-06 DIAGNOSIS — I5033 Acute on chronic diastolic (congestive) heart failure: Secondary | ICD-10-CM | POA: Diagnosis not present

## 2021-04-06 DIAGNOSIS — C569 Malignant neoplasm of unspecified ovary: Secondary | ICD-10-CM | POA: Diagnosis not present

## 2021-04-06 DIAGNOSIS — M1990 Unspecified osteoarthritis, unspecified site: Secondary | ICD-10-CM | POA: Diagnosis not present

## 2021-04-06 DIAGNOSIS — Z8781 Personal history of (healed) traumatic fracture: Secondary | ICD-10-CM | POA: Diagnosis not present

## 2021-04-06 DIAGNOSIS — I714 Abdominal aortic aneurysm, without rupture, unspecified: Secondary | ICD-10-CM | POA: Diagnosis not present

## 2021-04-06 DIAGNOSIS — T7840XD Allergy, unspecified, subsequent encounter: Secondary | ICD-10-CM | POA: Diagnosis not present

## 2021-04-06 DIAGNOSIS — Z515 Encounter for palliative care: Secondary | ICD-10-CM | POA: Diagnosis not present

## 2021-04-06 NOTE — TOC Transition Note (Signed)
Transition of Care [facility]) - CM/SW Discharge Note   Patient Details  Name: Adriana Castro MRN: [id number] Date of Birth: 06-21-1928  Transition of Care [facility]) CM/SW Contact:  [place], [place] Phone Number: 04/06/2021, 10:23 AM   Clinical Narrative:    Patient will DC to: [facility], SNF Anticipated DC date: 04/06/21 Family notified: Yes Transport by: Corey Harold   Per MD patient ready for DC to [facility], SNF. RN to call report prior to discharge [phone] Room# [phone]). RN, patient, patient's family, and facility notified of DC. Discharge Summary and FL2 sent to facility. DC packet on chart. Ambulance transport requested for patient.   CSW will sign off for now as social work intervention is no longer needed. Please consult [place] again if new needs arise.     Final next level of care: Skilled Nursing Facility Barriers to Discharge: No Barriers Identified   Patient Goals and CMS Choice Patient states their goals for this hospitalization and ongoing recovery are:: to return home CMS Medicare.gov Compare Post Acute Care list provided to:: Patient Choice offered to / list presented to : Patient  Discharge Placement PASRR number recieved: 04/06/21            Patient chooses bed at: [place] [place] Patient to be transferred to facility by: [facility]   Patient and family notified of of transfer: 04/06/21  Discharge Plan and Services                                     Social Determinants of Health (SDOH) Interventions     Readmission Risk Interventions No flowsheet data found.   Anastasios Melander, MSW, [place] Heart Failure Social Worker

## 2021-04-06 NOTE — Plan of Care (Signed)

## 2021-04-06 NOTE — Progress Notes (Signed)
87 year old female past medical history for COPD, atrial fibrillation, hypertension, ovarian cancer, anxiety depression admitted to the hospital with the working diagnosis of acute diastolic heart failure exacerbation, complicated with atrial fibrillation with rapid ventricular response.  She has been treated with diuresis and AV blockade with good response.   Patient will be discharged to SNF.   Today she is feeling well, has no dyspnea or chest pain. Continue medical therapy for heart failure per discharge summary instructions.

## 2021-04-10 DIAGNOSIS — I10 Essential (primary) hypertension: Secondary | ICD-10-CM | POA: Diagnosis not present

## 2021-04-10 DIAGNOSIS — J449 Chronic obstructive pulmonary disease, unspecified: Secondary | ICD-10-CM | POA: Diagnosis not present

## 2021-04-10 DIAGNOSIS — J441 Chronic obstructive pulmonary disease with (acute) exacerbation: Secondary | ICD-10-CM | POA: Diagnosis not present

## 2021-04-10 DIAGNOSIS — K219 Gastro-esophageal reflux disease without esophagitis: Secondary | ICD-10-CM | POA: Diagnosis not present

## 2021-04-10 DIAGNOSIS — I5033 Acute on chronic diastolic (congestive) heart failure: Secondary | ICD-10-CM | POA: Diagnosis not present

## 2021-04-10 DIAGNOSIS — D509 Iron deficiency anemia, unspecified: Secondary | ICD-10-CM | POA: Diagnosis not present

## 2021-04-12 ENCOUNTER — Non-Acute Institutional Stay: Payer: Medicare Other | Admitting: Hospice

## 2021-04-12 ENCOUNTER — Other Ambulatory Visit: Payer: Self-pay

## 2021-04-12 DIAGNOSIS — I5033 Acute on chronic diastolic (congestive) heart failure: Secondary | ICD-10-CM

## 2021-04-12 DIAGNOSIS — Z515 Encounter for palliative care: Secondary | ICD-10-CM | POA: Diagnosis not present

## 2021-04-12 DIAGNOSIS — J449 Chronic obstructive pulmonary disease, unspecified: Secondary | ICD-10-CM | POA: Diagnosis not present

## 2021-04-12 DIAGNOSIS — J9601 Acute respiratory failure with hypoxia: Secondary | ICD-10-CM

## 2021-04-12 NOTE — Progress Notes (Addendum)
[facility] Consult Note Telephone: [phone]  Fax: [phone]  PATIENT NAME: Adriana Castro [address] [place] [address] [phone] (home)  DOB: 07/07/1928 MRN: [id number]  PRIMARY CARE PROVIDER:    Nicholos Johns, MD,  [place] [address] [phone]  REFERRING PROVIDER:   Dr. Leanna Battles  RESPONSIBLE PARTY:   Self Contact Information     Name Relation Home Work Mobile   [place] Sister [phone]  [phone]        I met face to face with patient at facility. Palliative Care was asked to follow this patient by consultation request of  Dr. Threasa Beards to address advance care planning, complex medical decision making and goals of care clarification. [name] and other family members members are present during visit. Patient endorsed palliative service.This is the initial visit.    ASSESSMENT AND / RECOMMENDATIONS:   Advance Care Planning: Our advance care planning conversation included a discussion about:    The value and importance of advance care planning  Difference between Hospice and Palliative care Exploration of goals of care in the event of a sudden injury or illness  Identification and preparation of a healthcare agent  Review and updating or creation of an  advance directive document . Decision not to resuscitate or to de-escalate disease focused treatments due to poor prognosis.  CODE STATUS: Patient is a DO NOT RESUSCITATE.  Goals of Care: Goals include to maximize quality of life and symptom management. Patient is interested in hospice in the future when she qualifies for it.  I spent 20 minutes providing this initial consultation. More than 50% of the time in this consultation was spent on counseling patient and coordinating  communication. --------------------------------------------------------------------------------------------------------------------------------------  Symptom Management/Plan: Acute respiratory failure with hypoxia: Likely related to Afib and CHF. Hospitalized and treated 10/28 - 04/06/2021.  Continue oxygen supplementation 2 L/min by nasal cannula Acute diastolic CHF: Diuresed in the hospital with IV lasix with improvement in symptoms. Continue po Lasix, fluid restriction 1500cc and sodium restriction to <2 g. Continue to monitor weight daily/weekly, Routine CBC BMP COPD: Managed with Symbicort and Incruse Ellipta and oxygen supplementation. Weakness: Physical and occupational therapy is ongoing. Follow up: Palliative care will continue to follow for complex medical decision making, advance care planning, and clarification of goals. Return 6 weeks or prn.Encouraged to call provider sooner with any concerns.   Family /Caregiver/Community Supports: Patient in SNF for ongoing care.   HOSPICE ELIGIBILITY/DIAGNOSIS: TBD  Chief Complaint: Initial Palliative care visit  HISTORY OF PRESENT ILLNESS:  Adriana Castro is a 87 y.o. year old female  with multiple medical conditions including acute on chronic diastolic congestive heart failure, worsened in the last month for which patient was hospitalized.  Recent hospitalization 10/28 - 04/06/2021 for A. fib with RVR, acute respiratory failure with hypoxia, acute diastolic congestive heart failure.  Patient endorses worsening weakness related to CHF, especially during activities of daily living; this has impaired her independence and quality of life.  Physical and Occupational Therapy are ongoing-helpful.  Today she denies pain/discomfort or respiratory distress, desires to be discharged home; she is willing to complete her rehab stay. History of Ovarian center s/p chemo/radiation, Depression, anxiety, HTN History obtained from review of EMR, discussion with  primary team, caregiver, family and/or Adriana Castro.  Review and summarization of Epic records shows history from other than patient. Rest of 10 point ROS asked and negative.     Review of  lab tests/diagnostics   Results for Adriana, EINSPAHR (MRN [id number]) as of 04/12/2021 14:35  Ref. Range 04/05/2021 03:31  Sodium Latest Ref Range: 135 - 145 mmol/L 135  Potassium Latest Ref Range: 3.5 - 5.1 mmol/L 4.1  Chloride Latest Ref Range: 98 - 111 mmol/L 99  CO2 Latest Ref Range: 22 - 32 mmol/L 30  Glucose Latest Ref Range: 70 - 99 mg/dL 80  BUN Latest Ref Range: 8 - 23 mg/dL 13  Creatinine Latest Ref Range: 0.44 - 1.00 mg/dL 1.01 (H)  Calcium Latest Ref Range: 8.9 - 10.3 mg/dL 9.0  Anion gap Latest Ref Range: 5 - 15  6  Phosphorus Latest Ref Range: 2.5 - 4.6 mg/dL 3.3  Magnesium Latest Ref Range: 1.7 - 2.4 mg/dL 2.0  Albumin Latest Ref Range: 3.5 - 5.0 g/dL 2.9 (L)  GFR, Estimated Latest Ref Range: >60 mL/min 53 (L)    ROS General: NAD EYES: denies vision changes ENMT: denies dysphagia Cardiovascular: denies chest pain/discomfort Pulmonary: denies cough, denies SOB Abdomen: endorses good appetite, denies constipation/diarrhea GU: denies dysuria, urinary frequency MSK:  endorses weakness,  no falls reported Skin: denies rashes or wounds Neurological: denies pain, denies insomnia Psych: Endorses positive mood Heme/lymph/immuno: denies bruises, abnormal bleeding  Physical Exam: Constitutional: NAD General: Well groomed, cooperative EYES: anicteric sclera, lids intact, no discharge  ENMT: Moist mucous membrane CV: S1 S2, RRR, no LE edema Pulmonary: LCTA, no increased work of breathing, no cough, Abdomen: active BS + 4 quadrants, soft and non tender GU: no suprapubic tenderness MSK: weakness, limited ROM Skin: warm and dry, no rashes or wounds on visible skin Neuro:  weakness, otherwise non focal Psych: non-anxious affect Hem/lymph/immuno: no widespread bruising   PAST MEDICAL  HISTORY:  Active Ambulatory Problems    Diagnosis Date Noted   COPD (chronic obstructive pulmonary disease) ([place]) 02/05/2013   Hypoxia 02/05/2013   Leaky heart valve    Chest pain 02/05/2013   Closed bicondylar fracture of distal end of left humerus    History of open reduction and internal fixation (ORIF) procedure 11/01/2016   HTN (hypertension) 09/10/2013   Hyperlipidemia 09/10/2013   Abdominal aortic aneurysm without rupture 01/06/2016   Adrenal adenoma 09/10/2013   Anxiety 09/10/2013   Bilateral carotid artery stenosis 01/06/2016   Depression 09/10/2013   Encounter for antineoplastic chemotherapy 11/09/2013   GERD (gastroesophageal reflux disease) 09/10/2013   Malignant neoplasm of ovary (HCC) 09/28/2013   Atrial fibrillation (HCC) 09/10/2013   Aneurysm ([place]) 09/10/2013   Malignant ovarian neoplasm (HCC) 09/28/2013   Arthritis    Dyslipidemia    Dysrhythmia    Heart murmur    Hypertension    Irregular heart beat    Mood disorder (HCC)    Ovarian cancer (HCC)    Shortness of breath    Atrial fibrillation with rapid ventricular response ([place]) 04/01/2021   Acute on chronic heart failure ([place]) 04/02/2021   Resolved Ambulatory Problems    Diagnosis Date Noted   No Resolved Ambulatory Problems   No Additional Past Medical History    SOCIAL HX:  Social History   Tobacco Use   Smoking status: Former    Types: Cigarettes    Quit date: 06/07/2012    Years since quitting: 8.8   Smokeless tobacco: Never  Substance Use Topics   Alcohol use: No     FAMILY HX:  Family History  Problem Relation Age of Onset   Diabetes Mother    Heart attack Mother    Heart disease Mother  Hypertension Mother    Asthma Father    Stroke Father    Breast cancer Sister    Prostate cancer Brother    Diabetes Brother    Stroke Brother       ALLERGIES:  Allergies  Allergen Reactions   Albuterol Nausea And Vomiting      PERTINENT MEDICATIONS:  Outpatient Encounter Medications  as of 04/12/2021  Medication Sig   budesonide-formoterol (SYMBICORT) 160-4.5 MCG/ACT inhaler Inhale 2 puffs into the lungs 2 (two) times daily.   digoxin (LANOXIN) 0.125 MG tablet TAKE 1 TABLET (0.125 MG TOTAL) BY MOUTH EVERY MONDAY, WEDNESDAY, AND FRIDAY. (Patient taking differently: Take 0.125 mg by mouth every Monday, Wednesday, and Friday.)   diltiazem (CARDIZEM CD) 240 MG 24 hr capsule Take 1 capsule (240 mg total) by mouth daily.   fluticasone (FLONASE) 50 MCG/ACT nasal spray Place 1 spray into both nostrils daily as needed for allergies.   furosemide (LASIX) 20 MG tablet Take 1 tablet (20 mg total) by mouth daily.   INCRUSE ELLIPTA 62.5 MCG/INH AEPB Inhale 2 puffs into the lungs daily.   montelukast (SINGULAIR) 10 MG tablet Take 10 mg by mouth every evening.   nitroGLYCERIN (NITROSTAT) 0.4 MG SL tablet PLACE 1 TABLET UNDER TONGUE EVERY 5 MINS, UP TO 3 DOSES AS NEEDED FOR CHEST PAIN (Patient taking differently: Place 0.4 mg under the tongue every 5 (five) minutes as needed for chest pain.)   omeprazole (PRILOSEC) 20 MG capsule Take 20 mg by mouth daily as needed for indigestion.   polyethylene glycol (MIRALAX / GLYCOLAX) 17 g packet Take 17 g by mouth 2 (two) times daily as needed for mild constipation.   Rivaroxaban (XARELTO) 15 MG TABS tablet Take 1 tablet (15 mg total) by mouth daily with supper. (Patient not taking: No sig reported)   senna-docusate (SENOKOT-S) 8.6-50 MG tablet Take 1 tablet by mouth 2 (two) times daily as needed for moderate constipation.   XOPENEX HFA 45 MCG/ACT inhaler Inhale 2 puffs into the lungs in the morning, at noon, in the evening, and at bedtime.   No facility-administered encounter medications on file as of 04/12/2021.    Thank you for the opportunity to participate in the care of Adriana Castro.  The palliative care team will continue to follow. Please call our office at [phone] if we can be of additional assistance.   Note: Portions of this note were  generated with [employer]. Dictation errors may occur despite best attempts at proofreading.  Teodoro Spray, NP

## 2021-04-13 ENCOUNTER — Other Ambulatory Visit: Payer: Self-pay | Admitting: *Deleted

## 2021-04-13 NOTE — Patient Outreach (Signed)
Member screened for potential [facility] Care Management needs. Mrs. Blankenburg resides in [facility].  Update received from [facility] SW indicating member wants to return home alone. Has supportive family but they are not always available per SNF SW. Facility is trying to wean member off of oxygen prior to dc. Will likely return home this week per Mrs. Delis's request. SNF SW recommends [facility] Care Management follow up.  Writer to plan outreach to discuss [facility] Management services.   Marthenia Rolling, MSN, RN,BSN [place] Acute Care Coordinator [phone] [facility]) [phone]  (Toll free office)

## 2021-04-18 ENCOUNTER — Telehealth: Payer: Self-pay | Admitting: Cardiology

## 2021-04-18 DIAGNOSIS — I4891 Unspecified atrial fibrillation: Secondary | ICD-10-CM

## 2021-04-18 NOTE — Telephone Encounter (Signed)
Pt c/o medication issue:  1. Name of Medication: furosemide (LASIX) 40 MG tablet  2. How are you currently taking this medication (dosage and times per day)? Need to verify whether she should take  3. Are you having a reaction (difficulty breathing--STAT)? no  4. What is your medication issue? Dian Situ from [facility] states the patient was at [facility] and they sent a bubble pack home with lasix 40 mg to take as needed. She would like to know if she is okay to take it. Phone:  [phone]

## 2021-04-19 ENCOUNTER — Other Ambulatory Visit: Payer: Self-pay | Admitting: *Deleted

## 2021-04-19 DIAGNOSIS — I10 Essential (primary) hypertension: Secondary | ICD-10-CM

## 2021-04-19 NOTE — Addendum Note (Signed)
Addended by: Truddie Hidden on: 04/19/2021 01:26 PM   Modules accepted: Orders

## 2021-04-19 NOTE — Patient Outreach (Signed)
[facility] Post- Acute Care Coordinator follow up. Per [facility] Mrs. Green transitioned to home with [facility] on 04/15/21.   Telephone call made to Mrs. Carlota Raspberry [phone]. Patient identifiers confirmed. Discussed [facility] Care Management services. Mrs. Vanscyoc is agreeable. She lives alone and her sister Gerri Lins assists her.   Spoke with Mrs. [name] Ingold/sister/DPR, per [employer] request. [name] states she is staying with Mrs. Carlota Raspberry for now since she is home from SNF. [name] (sister) states member is unable to physically make it to the PCP office. Reports they have been in touch with PCP however. Discussed [facility] referral but sister Rana Snare declined. [employer] (sister/DPR) states she is primary contact.   Will make referral to [facility] for complex case management. Member has also been active with [facility] palliative. Will touch base with [facility] palliative to see if they are aware member is now home from SNF.    Marthenia Rolling, MSN, RN,BSN [place] Acute Care Coordinator [phone] [facility]) [phone]  (Toll free office)

## 2021-04-19 NOTE — Telephone Encounter (Signed)
Recommendations reviewed with Baird Lyons, RN as per Dr. Julien Nordmann note.  Cecille Rubin verbalized understanding and had no additional questions.

## 2021-04-19 NOTE — Telephone Encounter (Signed)
Spoke with Margarita Grizzle who states that the pt is taking Lasix 20 mg daily and has a 40 mg Lasix prn. How do you advise?

## 2021-04-21 ENCOUNTER — Other Ambulatory Visit: Payer: Self-pay | Admitting: *Deleted

## 2021-04-21 NOTE — Patient Outreach (Signed)
[facility]) Care Management  04/21/2021  Adriana Castro [birth year] [id number]  Referral Received 11/15 Initial Outreach Transition of Care  RN attempted outreach call today to the DPR sister Ananias Pilgrim however only able to leave a HIPAA approved voice message requesting a call back.  RN will attempt another outreach call over the next week for pending services. Will also send outreach letter as another form of contact.  Raina Mina, RN Care Management Coordinator [place] Office [phone]

## 2021-04-22 DIAGNOSIS — R5383 Other fatigue: Secondary | ICD-10-CM | POA: Diagnosis not present

## 2021-04-26 DIAGNOSIS — D649 Anemia, unspecified: Secondary | ICD-10-CM | POA: Diagnosis not present

## 2021-04-26 DIAGNOSIS — R5383 Other fatigue: Secondary | ICD-10-CM | POA: Diagnosis not present

## 2021-04-26 DIAGNOSIS — E538 Deficiency of other specified B group vitamins: Secondary | ICD-10-CM | POA: Diagnosis not present

## 2021-04-26 DIAGNOSIS — Z8709 Personal history of other diseases of the respiratory system: Secondary | ICD-10-CM | POA: Diagnosis not present

## 2021-04-26 DIAGNOSIS — Z79899 Other long term (current) drug therapy: Secondary | ICD-10-CM | POA: Diagnosis not present

## 2021-04-26 DIAGNOSIS — Z6824 Body mass index (BMI) 24.0-24.9, adult: Secondary | ICD-10-CM | POA: Diagnosis not present

## 2021-04-27 ENCOUNTER — Other Ambulatory Visit: Payer: Self-pay | Admitting: *Deleted

## 2021-04-27 NOTE — Patient Outreach (Signed)
[name]  04/27/2021  Adriana Castro April 06, 1929 [id number]   Transition of care-Unsuccessful Outreach #2  RN attempted another outreach call however remains unsuccessful. RN able to leave a HIPAA approved voice message requesting a call back.  RN will attempt another outreach call for pending services over the next week.  Raina Mina, RN Care Management Coordinator [place] Office [phone]

## 2021-05-04 ENCOUNTER — Other Ambulatory Visit: Payer: Self-pay | Admitting: *Deleted

## 2021-05-04 NOTE — Patient Outreach (Signed)
[facility]) Care Management  05/04/2021  Adriana Castro 02-05-1929 [id number]   Transition of [place] [facility] #3  RN attempted another outreach call to the DPR sister Adriana Castro). RN able to leave a HIPAA approved voice message requesting a call back.  Will attempt another outreach call over the next few weeks.  Raina Mina, RN Care Management Coordinator [place] Office [phone]

## 2021-05-06 ENCOUNTER — Telehealth: Payer: Self-pay

## 2021-05-06 NOTE — Telephone Encounter (Signed)
Attempted to contact patient's sister Naoma Diener to schedule a Palliative Care consult appointment. No answer and unable to leave a message. No voicemail.

## 2021-05-13 ENCOUNTER — Telehealth: Payer: Self-pay

## 2021-05-13 NOTE — Telephone Encounter (Signed)
Attempted to contact patient's sister Naoma Diener to schedule a Palliative Care consult appointment. No answer and unable to leave a message. No voicemail.

## 2021-05-17 DIAGNOSIS — I110 Hypertensive heart disease with heart failure: Secondary | ICD-10-CM | POA: Diagnosis not present

## 2021-05-17 DIAGNOSIS — J441 Chronic obstructive pulmonary disease with (acute) exacerbation: Secondary | ICD-10-CM | POA: Diagnosis not present

## 2021-05-17 DIAGNOSIS — F32A Depression, unspecified: Secondary | ICD-10-CM | POA: Diagnosis not present

## 2021-05-17 DIAGNOSIS — I272 Pulmonary hypertension, unspecified: Secondary | ICD-10-CM | POA: Diagnosis not present

## 2021-05-17 DIAGNOSIS — Z7901 Long term (current) use of anticoagulants: Secondary | ICD-10-CM | POA: Diagnosis not present

## 2021-05-17 DIAGNOSIS — M1990 Unspecified osteoarthritis, unspecified site: Secondary | ICD-10-CM | POA: Diagnosis not present

## 2021-05-17 DIAGNOSIS — I4891 Unspecified atrial fibrillation: Secondary | ICD-10-CM | POA: Diagnosis not present

## 2021-05-17 DIAGNOSIS — F419 Anxiety disorder, unspecified: Secondary | ICD-10-CM | POA: Diagnosis not present

## 2021-05-17 DIAGNOSIS — K219 Gastro-esophageal reflux disease without esophagitis: Secondary | ICD-10-CM | POA: Diagnosis not present

## 2021-05-17 DIAGNOSIS — D509 Iron deficiency anemia, unspecified: Secondary | ICD-10-CM | POA: Diagnosis not present

## 2021-05-17 DIAGNOSIS — E785 Hyperlipidemia, unspecified: Secondary | ICD-10-CM | POA: Diagnosis not present

## 2021-05-17 DIAGNOSIS — I5033 Acute on chronic diastolic (congestive) heart failure: Secondary | ICD-10-CM | POA: Diagnosis not present

## 2021-05-17 DIAGNOSIS — I714 Abdominal aortic aneurysm, without rupture, unspecified: Secondary | ICD-10-CM | POA: Diagnosis not present

## 2021-05-17 DIAGNOSIS — Z8543 Personal history of malignant neoplasm of ovary: Secondary | ICD-10-CM | POA: Diagnosis not present

## 2021-05-17 DIAGNOSIS — J9601 Acute respiratory failure with hypoxia: Secondary | ICD-10-CM | POA: Diagnosis not present

## 2021-05-17 DIAGNOSIS — Z87891 Personal history of nicotine dependence: Secondary | ICD-10-CM | POA: Diagnosis not present

## 2021-05-17 DIAGNOSIS — I341 Nonrheumatic mitral (valve) prolapse: Secondary | ICD-10-CM | POA: Diagnosis not present

## 2021-05-19 ENCOUNTER — Telehealth: Payer: Self-pay

## 2021-05-19 DIAGNOSIS — J9601 Acute respiratory failure with hypoxia: Secondary | ICD-10-CM | POA: Diagnosis not present

## 2021-05-19 DIAGNOSIS — D509 Iron deficiency anemia, unspecified: Secondary | ICD-10-CM | POA: Diagnosis not present

## 2021-05-19 DIAGNOSIS — I5033 Acute on chronic diastolic (congestive) heart failure: Secondary | ICD-10-CM | POA: Diagnosis not present

## 2021-05-19 DIAGNOSIS — J441 Chronic obstructive pulmonary disease with (acute) exacerbation: Secondary | ICD-10-CM | POA: Diagnosis not present

## 2021-05-19 DIAGNOSIS — I714 Abdominal aortic aneurysm, without rupture, unspecified: Secondary | ICD-10-CM | POA: Diagnosis not present

## 2021-05-19 DIAGNOSIS — I110 Hypertensive heart disease with heart failure: Secondary | ICD-10-CM | POA: Diagnosis not present

## 2021-05-19 NOTE — Telephone Encounter (Signed)
Spoke with patient regarding scheduling a Palliative Care consult. Patient states she is doing well and declined services at this time. Patient advised to le [place] know if she would like services at a later time. She verbalized understanding. Referral canceled.

## 2021-05-24 DIAGNOSIS — D509 Iron deficiency anemia, unspecified: Secondary | ICD-10-CM | POA: Diagnosis not present

## 2021-05-24 DIAGNOSIS — I5033 Acute on chronic diastolic (congestive) heart failure: Secondary | ICD-10-CM | POA: Diagnosis not present

## 2021-05-24 DIAGNOSIS — I714 Abdominal aortic aneurysm, without rupture, unspecified: Secondary | ICD-10-CM | POA: Diagnosis not present

## 2021-05-24 DIAGNOSIS — J441 Chronic obstructive pulmonary disease with (acute) exacerbation: Secondary | ICD-10-CM | POA: Diagnosis not present

## 2021-05-24 DIAGNOSIS — J9601 Acute respiratory failure with hypoxia: Secondary | ICD-10-CM | POA: Diagnosis not present

## 2021-05-24 DIAGNOSIS — I110 Hypertensive heart disease with heart failure: Secondary | ICD-10-CM | POA: Diagnosis not present

## 2021-05-26 DIAGNOSIS — I714 Abdominal aortic aneurysm, without rupture, unspecified: Secondary | ICD-10-CM | POA: Diagnosis not present

## 2021-05-26 DIAGNOSIS — J9601 Acute respiratory failure with hypoxia: Secondary | ICD-10-CM | POA: Diagnosis not present

## 2021-05-26 DIAGNOSIS — J441 Chronic obstructive pulmonary disease with (acute) exacerbation: Secondary | ICD-10-CM | POA: Diagnosis not present

## 2021-05-26 DIAGNOSIS — D509 Iron deficiency anemia, unspecified: Secondary | ICD-10-CM | POA: Diagnosis not present

## 2021-05-26 DIAGNOSIS — I5033 Acute on chronic diastolic (congestive) heart failure: Secondary | ICD-10-CM | POA: Diagnosis not present

## 2021-05-26 DIAGNOSIS — I110 Hypertensive heart disease with heart failure: Secondary | ICD-10-CM | POA: Diagnosis not present

## 2021-05-27 DIAGNOSIS — I714 Abdominal aortic aneurysm, without rupture, unspecified: Secondary | ICD-10-CM | POA: Diagnosis not present

## 2021-05-27 DIAGNOSIS — I5033 Acute on chronic diastolic (congestive) heart failure: Secondary | ICD-10-CM | POA: Diagnosis not present

## 2021-05-27 DIAGNOSIS — J9601 Acute respiratory failure with hypoxia: Secondary | ICD-10-CM | POA: Diagnosis not present

## 2021-05-27 DIAGNOSIS — J441 Chronic obstructive pulmonary disease with (acute) exacerbation: Secondary | ICD-10-CM | POA: Diagnosis not present

## 2021-05-27 DIAGNOSIS — D509 Iron deficiency anemia, unspecified: Secondary | ICD-10-CM | POA: Diagnosis not present

## 2021-05-27 DIAGNOSIS — I110 Hypertensive heart disease with heart failure: Secondary | ICD-10-CM | POA: Diagnosis not present

## 2021-05-31 ENCOUNTER — Other Ambulatory Visit: Payer: Self-pay | Admitting: *Deleted

## 2021-05-31 DIAGNOSIS — I110 Hypertensive heart disease with heart failure: Secondary | ICD-10-CM | POA: Diagnosis not present

## 2021-05-31 DIAGNOSIS — D509 Iron deficiency anemia, unspecified: Secondary | ICD-10-CM | POA: Diagnosis not present

## 2021-05-31 DIAGNOSIS — I5033 Acute on chronic diastolic (congestive) heart failure: Secondary | ICD-10-CM | POA: Diagnosis not present

## 2021-05-31 DIAGNOSIS — J441 Chronic obstructive pulmonary disease with (acute) exacerbation: Secondary | ICD-10-CM | POA: Diagnosis not present

## 2021-05-31 DIAGNOSIS — J9601 Acute respiratory failure with hypoxia: Secondary | ICD-10-CM | POA: Diagnosis not present

## 2021-05-31 DIAGNOSIS — I714 Abdominal aortic aneurysm, without rupture, unspecified: Secondary | ICD-10-CM | POA: Diagnosis not present

## 2021-05-31 NOTE — Patient Outreach (Signed)
[facility]) Care Management  05/31/2021  Adriana Castro [birth year] [id number]   Telephone Assessment-Re: Call Back  RN spoke with pt's sister [name] who indicated pt was sleeping late today. Requested a call back and set a scheduled date on Thursday later this week.  Will follow up with another outreach on Thursday as requested.  Raina Mina, RN Care Management Coordinator [place] Office [phone]

## 2021-06-02 ENCOUNTER — Other Ambulatory Visit: Payer: Self-pay | Admitting: *Deleted

## 2021-06-02 DIAGNOSIS — I714 Abdominal aortic aneurysm, without rupture, unspecified: Secondary | ICD-10-CM | POA: Diagnosis not present

## 2021-06-02 DIAGNOSIS — I110 Hypertensive heart disease with heart failure: Secondary | ICD-10-CM | POA: Diagnosis not present

## 2021-06-02 DIAGNOSIS — D509 Iron deficiency anemia, unspecified: Secondary | ICD-10-CM | POA: Diagnosis not present

## 2021-06-02 DIAGNOSIS — I5033 Acute on chronic diastolic (congestive) heart failure: Secondary | ICD-10-CM | POA: Diagnosis not present

## 2021-06-02 DIAGNOSIS — J9601 Acute respiratory failure with hypoxia: Secondary | ICD-10-CM | POA: Diagnosis not present

## 2021-06-02 DIAGNOSIS — J441 Chronic obstructive pulmonary disease with (acute) exacerbation: Secondary | ICD-10-CM | POA: Diagnosis not present

## 2021-06-02 NOTE — Patient Outreach (Signed)
[facility]) Care Management  06/02/2021  Adriana Castro [birth year] [id number]   Transition of care- Re: call back  RN spoke with pt's sister Naoma Diener who indicated pt just layed down for a snap and she has requested a call back at a specific time for tomorrow.   Will scheduled a telephone follow up for tomorrow pending possible enrollment.  Raina Mina, RN Care Management Coordinator [place] Office [phone]

## 2021-06-03 ENCOUNTER — Other Ambulatory Visit: Payer: Self-pay | Admitting: *Deleted

## 2021-06-03 NOTE — Patient Outreach (Signed)
[facility]) Care Management  06/03/2021  Adriana Castro 06-18-1928 [id number]   Telephone Assessment  RN returned call to pt's sister Adriana Castro however was not available however sister Adriana Castro indicated she would have a conversation with pt for possible enrollment into the [facility] program and services.  Explained the [facility] services in detail once again for presentation to the pt. Sister Adriana Castro indicated se would explain the available services and outreach to this RN case manager if pt is interested in participation.  Due to several calls and return calls with ongoing delays will follow up next month with an additional call if no call back received over the next few weeks. Sister very appreciative for the return call however pt has just not be available for further engagement.  Raina Mina, RN Care Management Coordinator [place] Office [phone]

## 2021-06-22 ENCOUNTER — Other Ambulatory Visit: Payer: Self-pay | Admitting: *Deleted

## 2021-06-22 ENCOUNTER — Encounter: Payer: Self-pay | Admitting: *Deleted

## 2021-06-22 NOTE — Patient Instructions (Signed)
Visit Information  Thank you for taking time to visit with me today. Please don't hesitate to contact me if I can be of assistance to you before our next scheduled telephone appointment.  Following are the goals we discussed today:  Take all medications as prescribed Attend all scheduled provider appointments Call pharmacy for medication refills 3-7 days in advance of running out of medications Attend church or other social activities Perform all self care activities independently  Perform IADL's (shopping, preparing meals, housekeeping, managing finances) independently Call provider office for new concerns or questions  bring symptom diary to all appointments make a plan to eat healthy keep all lab appointments take medicine as prescribed

## 2021-06-22 NOTE — Patient Outreach (Signed)
[facility]) Care Management  [place]  06/22/2021   Jaimi Belle 12/26/1928 [id number]  Telephone Assessment-Successful   Pt enrolled into the [facility] program and services today. Pt provided the initial assessment for the requested program of Atrial Fibrillation due to her hospitalization back in Oct 2022. Pt reports she is doing well in managing all other medical issues with no acute issues with her CHF/COPD/HTN or asthma.  Will follow up with sister [name] on pt's medication as her sister prepares her pill box and she is not able to confirm on most medications but aware of her Lasix and inhalers (adherent). No needs or request at this time as pt agreed to monthly follow up call and receptive to the AVS to be mailed with [facility] packet.  Encounter Medications:  Outpatient Encounter Medications as of 06/22/2021  Medication Sig   budesonide-formoterol (SYMBICORT) 160-4.5 MCG/ACT inhaler Inhale 2 puffs into the lungs 2 (two) times daily.   digoxin (LANOXIN) 0.125 MG tablet TAKE 1 TABLET (0.125 MG TOTAL) BY MOUTH EVERY MONDAY, WEDNESDAY, AND FRIDAY. (Patient taking differently: Take 0.125 mg by mouth every Monday, Wednesday, and Friday.)   diltiazem (CARDIZEM CD) 240 MG 24 hr capsule Take 1 capsule (240 mg total) by mouth daily.   fluticasone (FLONASE) 50 MCG/ACT nasal spray Place 1 spray into both nostrils daily as needed for allergies.   furosemide (LASIX) 20 MG tablet Take 1 tablet (20 mg total) by mouth daily.   INCRUSE ELLIPTA 62.5 MCG/INH AEPB Inhale 2 puffs into the lungs daily.   montelukast (SINGULAIR) 10 MG tablet Take 10 mg by mouth every evening.   nitroGLYCERIN (NITROSTAT) 0.4 MG SL tablet PLACE 1 TABLET UNDER TONGUE EVERY 5 MINS, UP TO 3 DOSES AS NEEDED FOR CHEST PAIN (Patient taking differently: Place 0.4 mg under the tongue every 5 (five) minutes as needed for chest pain.)   omeprazole (PRILOSEC) 20 MG capsule Take 20 mg by mouth daily as needed for  indigestion.   polyethylene glycol (MIRALAX / GLYCOLAX) 17 g packet Take 17 g by mouth 2 (two) times daily as needed for mild constipation.   Rivaroxaban (XARELTO) 15 MG TABS tablet Take 1 tablet (15 mg total) by mouth daily with supper. (Patient not taking: No sig reported)   senna-docusate (SENOKOT-S) 8.6-50 MG tablet Take 1 tablet by mouth 2 (two) times daily as needed for moderate constipation.   XOPENEX HFA 45 MCG/ACT inhaler Inhale 2 puffs into the lungs in the morning, at noon, in the evening, and at bedtime.   No facility-administered encounter medications on file as of 06/22/2021.    Functional Status:  No flowsheet data found.  Fall/Depression Screening: Fall Risk  06/22/2021 04/19/2018  Falls in the past year? 0 0  Comment - Emmi Telephone Survey: data to providers prior to load   PHQ 2/9 Scores 06/22/2021  PHQ - 2 Score 0    Assessment:   Care Plan Care Plan : [place]  Updates made by Tobi Bastos, RN since 06/22/2021 12:00 AM     Problem: Knowledge Deficit related to Atrial Fibrillation and care coordination needs.   Priority: High     Long-Range Goal: Establish plan of care for managing complex care coordination needs in pt with Atrail Fibrillation.   Start Date: 06/22/2021  Expected End Date: 12/02/2021  Priority: High  Note:   Current Barriers:  Knowledge Deficits related to plan of care for management of Atrial Fibrillation   RNCM Clinical  Goal(s):  Patient will verbalize understanding of plan for management of Atrial Fibrillation as evidenced by self report and  through collaboration with RN Care manager, provider, and care team.   Interventions: Inter-disciplinary care team collaboration (see longitudinal plan of care) Evaluation of current treatment plan related to  self management and patient's adherence to plan as established by provider   AFIB Interventions: (Status:  New goal.) Long Term Goal   Counseled on increased risk of  stroke due to Afib and benefits of anticoagulation for stroke prevention Reviewed importance of adherence to anticoagulant exactly as prescribed Counseled on avoidance of NSAIDs due to increased bleeding risk with anticoagulants Afib action plan reviewed Screening for signs and symptoms of depression related to chronic disease state  Assessed social determinant of health barriers  Patient Goals/Self-Care Activities: Take all medications as prescribed Attend all scheduled provider appointments Call pharmacy for medication refills 3-7 days in advance of running out of medications Attend church or other social activities Perform all self care activities independently  Perform IADL's (shopping, preparing meals, housekeeping, managing finances) independently Call provider office for new concerns or questions  bring symptom diary to all appointments make a plan to eat healthy keep all lab appointments take medicine as prescribed  Follow Up Plan:  Telephone follow up appointment with care management team member scheduled for:  Feb 2023      Raina Mina, RN Care Management Coordinator [place] Office [phone]

## 2021-07-08 ENCOUNTER — Other Ambulatory Visit: Payer: Self-pay | Admitting: *Deleted

## 2021-07-08 NOTE — Patient Instructions (Signed)
Visit Information  Thank you for taking time to visit with me today. Please don't hesitate to contact me if I can be of assistance to you in the future.

## 2021-07-14 ENCOUNTER — Encounter: Payer: Self-pay | Admitting: Podiatry

## 2021-07-14 ENCOUNTER — Ambulatory Visit (INDEPENDENT_AMBULATORY_CARE_PROVIDER_SITE_OTHER): Payer: Medicare HMO | Admitting: Podiatry

## 2021-07-14 DIAGNOSIS — B351 Tinea unguium: Secondary | ICD-10-CM | POA: Diagnosis not present

## 2021-07-14 DIAGNOSIS — L84 Corns and callosities: Secondary | ICD-10-CM

## 2021-07-14 DIAGNOSIS — M79675 Pain in left toe(s): Secondary | ICD-10-CM

## 2021-07-14 DIAGNOSIS — M79674 Pain in right toe(s): Secondary | ICD-10-CM

## 2021-07-14 DIAGNOSIS — I739 Peripheral vascular disease, unspecified: Secondary | ICD-10-CM | POA: Diagnosis not present

## 2021-07-14 DIAGNOSIS — L858 Other specified epidermal thickening: Secondary | ICD-10-CM

## 2021-07-21 NOTE — Progress Notes (Signed)
Subjective: Adriana Castro presents today referred by Nicholos Johns, MD for complaint of corn(s) left foot and painful thick toenails that are difficult to trim. Painful toenails interfere with ambulation. Aggravating factors include wearing enclosed shoe gear. Pain is relieved with periodic professional debridement. Painful corns are aggravated when weightbearing when wearing enclosed shoe gear. She has attempted no treatment for condition.  Past Medical History:  Diagnosis Date   Abdominal aortic aneurysm without rupture 01/06/2016   Adrenal adenoma 09/10/2013   Overview:  Left, unchanged per CT 09/10/2013  Formatting of this note might be different from the original. Left, unchanged per CT 09/10/2013   Aneurysm ([place]) 09/10/2013   Formatting of this note might be different from the original. Infrarenal aortic aneurysm 4.7 cm in greatest anteroposterior dimensions per CT ( 09/10/2013 )   Anxiety    Anxiety    Arthritis    Atrial fibrillation ([place]) 09/10/2013   Formatting of this note might be different from the original. f/b Dr. Geraldo Pitter ( [place], [place] ) Xarelto 15mg  daily (decreased from 20mg  daily 4/25) Diltiazem 120 BID Amioderone 200mg  daily added 4/25   Bilateral carotid artery stenosis 01/06/2016   Chest pain 02/05/2013   Closed bicondylar fracture of distal end of left humerus    COPD (chronic obstructive pulmonary disease) ([place])    Depression 09/10/2013   Dyslipidemia    Dysrhythmia    ATRIAL FIBRILATION   Encounter for antineoplastic chemotherapy 11/09/2013   GERD (gastroesophageal reflux disease)    Heart murmur    History of open reduction and internal fixation (ORIF) procedure 11/01/2016   HTN (hypertension) 09/10/2013   Hyperlipidemia 09/10/2013   Hypertension    Hypoxia 02/05/2013   Irregular heart beat    Leaky heart valve    Malignant neoplasm of ovary ([place]) 09/28/2013   Overview:  S/p 4 cycles neoadjuvant chemotherapy 09/23/12 exploratory laparotomy, TAH/BSO, omentectomy   Malignant ovarian  neoplasm ([place]) 09/28/2013   Formatting of this note might be different from the original. S/p 4 cycles neoadjuvant chemotherapy 09/23/12 exploratory laparotomy, TAH/BSO, omentectomy   Mood disorder ([place])    Ovarian cancer ([place])    Shortness of breath      Patient Active Problem List   Diagnosis Date Noted   Acute on chronic heart failure ([place]) 04/02/2021   Atrial fibrillation with rapid ventricular response ([place]) 04/01/2021   Arthritis    Dyslipidemia    Dysrhythmia    Heart murmur    Hypertension    Irregular heart beat    Mood disorder (H[facility])    Ovarian cancer ([place])    Shortness of breath    History of open reduction and internal fixation (ORIF) procedure 11/01/2016   Closed bicondylar fracture of distal end of left humerus    Abdominal aortic aneurysm without rupture 01/06/2016   Bilateral carotid artery stenosis 01/06/2016   Encounter for antineoplastic chemotherapy 11/09/2013   Malignant neoplasm of ovary ([place]) 09/28/2013   Malignant ovarian neoplasm ([place]) 09/28/2013   HTN (hypertension) 09/10/2013   Hyperlipidemia 09/10/2013   Adrenal adenoma 09/10/2013   Anxiety 09/10/2013   Depression 09/10/2013   GERD (gastroesophageal reflux disease) 09/10/2013   Atrial fibrillation ([place]) 09/10/2013   Aneurysm ([place]) 09/10/2013   COPD (chronic obstructive pulmonary disease) ([place]) 02/05/2013   Hypoxia 02/05/2013   Chest pain 02/05/2013   Leaky heart valve      Past Surgical History:  Procedure Laterality Date   BREAST BIOPSY     CATARACT EXTRACTION, BILATERAL     ORIF HUMERUS  FRACTURE Left 11/01/2016   Procedure: OPEN REDUCTION INTERNAL FIXATION (ORIF) LEFT SUPRACONDYLAR HUMERUS AND MEDIAL EPICONDYLE;  Surgeon: Leandrew Koyanagi, MD;  Location: [place];  Service: Orthopedics;  Laterality: Left;   SHOULDER SURGERY Right    ?   2002      SKIN CANCER EXCISION     X 5   TONSILLECTOMY       Current Outpatient Medications on File Prior to Visit  Medication Sig Dispense Refill    Rivaroxaban (XARELTO) 15 MG TABS tablet Take 1 tablet (15 mg total) by mouth daily with supper. 42 tablet 0   atorvastatin (LIPITOR) 10 MG tablet Take 10 mg by mouth at bedtime.     bisoprolol (ZEBETA) 5 MG tablet Take 5 mg by mouth every morning.     budesonide-formoterol (SYMBICORT) 160-4.5 MCG/ACT inhaler Inhale 2 puffs into the lungs 2 (two) times daily. 1 each 12   digoxin (LANOXIN) 0.125 MG tablet TAKE 1 TABLET (0.125 MG TOTAL) BY MOUTH EVERY MONDAY, WEDNESDAY, AND FRIDAY. (Patient taking differently: Take 0.125 mg by mouth every Monday, Wednesday, and Friday.) 36 tablet 1   diltiazem (CARDIZEM CD) 240 MG 24 hr capsule Take 1 capsule (240 mg total) by mouth daily.     fluticasone (FLONASE) 50 MCG/ACT nasal spray Place 1 spray into both nostrils daily as needed for allergies.     furosemide (LASIX) 20 MG tablet Take 1 tablet (20 mg total) by mouth daily. 30 tablet    INCRUSE ELLIPTA 62.5 MCG/INH AEPB Inhale 2 puffs into the lungs daily.     montelukast (SINGULAIR) 10 MG tablet Take 10 mg by mouth every evening.  2   nitroGLYCERIN (NITROSTAT) 0.4 MG SL tablet PLACE 1 TABLET UNDER TONGUE EVERY 5 MINS, UP TO 3 DOSES AS NEEDED FOR CHEST PAIN (Patient taking differently: Place 0.4 mg under the tongue every 5 (five) minutes as needed for chest pain.) 75 tablet 0   omeprazole (PRILOSEC) 20 MG capsule Take 20 mg by mouth daily as needed for indigestion.     polyethylene glycol (MIRALAX / GLYCOLAX) 17 g packet Take 17 g by mouth 2 (two) times daily as needed for mild constipation. 14 each 0   Prenatal 27-1 MG TABS Take 1 tablet by mouth daily.     senna-docusate (SENOKOT-S) 8.6-50 MG tablet Take 1 tablet by mouth 2 (two) times daily as needed for moderate constipation.     Vitamin D, Ergocalciferol, (DRISDOL) 1.25 MG (50000 UNIT) CAPS capsule Take 50,000 Units by mouth every 14 (fourteen) days.     XOPENEX HFA 45 MCG/ACT inhaler Inhale 2 puffs into the lungs in the morning, at noon, in the evening, and  at bedtime.     No current facility-administered medications on file prior to visit.     Allergies  Allergen Reactions   Albuterol Nausea And Vomiting     Social History   Occupational History   Not on file  Tobacco Use   Smoking status: Former    Types: Cigarettes    Quit date: 06/07/2012    Years since quitting: 9.1   Smokeless tobacco: Never  Vaping Use   Vaping Use: Never used  Substance and Sexual Activity   Alcohol use: No   Drug use: No   Sexual activity: Not on file     Family History  Problem Relation Age of Onset   Diabetes Mother    Heart attack Mother    Heart disease Mother    Hypertension Mother  Asthma Father    Stroke Father    Breast cancer Sister    Prostate cancer Brother    Diabetes Brother    Stroke Brother       There is no immunization history on file for this patient.   Objective: Adriana Castro is a pleasant 88 y.o. female in NAD. AAO x 3.  There were no vitals filed for this visit.  Vascular Examination:  CFT <3 seconds b/l LE. Faintly palpable DP pulses b/l LE. Diminished PT pulse(s) b/l LE. Pedal hair absent. No pain with calf compression b/l. Lower extremity skin temperature gradient within normal limits. No edema noted b/l LE. Trace edema noted BLE. No ischemia or gangrene noted b/l LE. No cyanosis or clubbing noted b/l LE.  Dermatological Examination: Pedal skin thin, shiny and atrophic b/l LE. No open wounds b/l LE. No interdigital macerations noted b/l LE. Toenails 1-5 bilaterally elongated, discolored, dystrophic, thickened, and crumbly with subungual debris and tenderness to dorsal palpation. Cutaneous horn plantar aspect of left heel. No erythema, no edema, no drainage, no fluctuiance. . Hyperkeratotic lesion(s) L hallux.  No erythema, no edema, no drainage, no fluctuance.  Musculoskeletal: Muscle strength 4/5 to all lower extremity muscle groups bilaterally. HAV with bunion deformity noted b/l  LE.  Neurological: Protective sensation intact 5/5 intact bilaterally with 10g monofilament b/l. Vibratory sensation intact b/l.  Assessment: 1. Pain due to onychomycosis of toenails of both feet   2. Callus   3. Cutaneous horn   4. PAD (peripheral artery disease) ([place])     Plan: -Examined patient. -Mycotic toenails 1-5 bilaterally were debrided in length and girth with sterile nail nippers and dremel without incident. -Callus(es) L hallux pared utilizing mandrel sander without complication or incident. Total number debrided =1. -Cutaneous horn pared left heel without incident. -Patient/POA to call should there be question/concern in the interim.  Return in about 3 months (around 10/11/2021). Will be placed on on-call list for 9 week followup should a slot become available.  Marzetta Board, DPM

## 2021-07-23 DIAGNOSIS — I361 Nonrheumatic tricuspid (valve) insufficiency: Secondary | ICD-10-CM

## 2021-07-23 DIAGNOSIS — I340 Nonrheumatic mitral (valve) insufficiency: Secondary | ICD-10-CM

## 2021-07-23 DIAGNOSIS — J90 Pleural effusion, not elsewhere classified: Secondary | ICD-10-CM

## 2021-08-02 ENCOUNTER — Telehealth: Payer: Self-pay | Admitting: Podiatry

## 2021-08-02 NOTE — Telephone Encounter (Signed)
Pts brother in law called to let us know pt passed away.

## 2021-08-03 DEATH — deceased

## 2021-10-20 ENCOUNTER — Ambulatory Visit: Payer: Medicare Other | Admitting: Podiatry
